# Patient Record
Sex: Female | Born: 1975 | Race: Black or African American | Hispanic: No | Marital: Single | Smoking: Never smoker
Health system: Southern US, Community
[De-identification: ages and names within clinical notes are randomized; demographics above are authoritative.]

## PROBLEM LIST (undated history)

## (undated) DIAGNOSIS — Z808 Family history of malignant neoplasm of other organs or systems: Secondary | ICD-10-CM

## (undated) DIAGNOSIS — C50919 Malignant neoplasm of unspecified site of unspecified female breast: Secondary | ICD-10-CM

## (undated) DIAGNOSIS — D649 Anemia, unspecified: Secondary | ICD-10-CM

## (undated) DIAGNOSIS — D219 Benign neoplasm of connective and other soft tissue, unspecified: Secondary | ICD-10-CM

## (undated) DIAGNOSIS — K219 Gastro-esophageal reflux disease without esophagitis: Secondary | ICD-10-CM

## (undated) DIAGNOSIS — Z1371 Encounter for nonprocreative screening for genetic disease carrier status: Secondary | ICD-10-CM

## (undated) DIAGNOSIS — R112 Nausea with vomiting, unspecified: Secondary | ICD-10-CM

## (undated) DIAGNOSIS — A64 Unspecified sexually transmitted disease: Secondary | ICD-10-CM

## (undated) DIAGNOSIS — Z800 Family history of malignant neoplasm of digestive organs: Secondary | ICD-10-CM

## (undated) DIAGNOSIS — Z923 Personal history of irradiation: Secondary | ICD-10-CM

## (undated) DIAGNOSIS — Z87442 Personal history of urinary calculi: Secondary | ICD-10-CM

## (undated) DIAGNOSIS — L659 Nonscarring hair loss, unspecified: Secondary | ICD-10-CM

## (undated) DIAGNOSIS — Z9889 Other specified postprocedural states: Secondary | ICD-10-CM

## (undated) HISTORY — DX: Family history of malignant neoplasm of digestive organs: Z80.0

## (undated) HISTORY — DX: Unspecified sexually transmitted disease: A64

## (undated) HISTORY — DX: Nonscarring hair loss, unspecified: L65.9

## (undated) HISTORY — PX: BREAST LUMPECTOMY: SHX2

## (undated) HISTORY — PX: DILATION AND CURETTAGE OF UTERUS: SHX78

## (undated) HISTORY — DX: Family history of malignant neoplasm of other organs or systems: Z80.8

---

## 2002-03-24 ENCOUNTER — Other Ambulatory Visit: Admission: RE | Admit: 2002-03-24 | Discharge: 2002-03-24 | Payer: Self-pay | Admitting: Gynecology

## 2003-06-02 ENCOUNTER — Other Ambulatory Visit: Admission: RE | Admit: 2003-06-02 | Discharge: 2003-06-02 | Payer: Self-pay | Admitting: Gynecology

## 2003-09-20 ENCOUNTER — Emergency Department (HOSPITAL_COMMUNITY): Admission: EM | Admit: 2003-09-20 | Discharge: 2003-09-20 | Payer: Self-pay | Admitting: Emergency Medicine

## 2003-09-22 ENCOUNTER — Inpatient Hospital Stay (HOSPITAL_COMMUNITY): Admission: AD | Admit: 2003-09-22 | Discharge: 2003-09-22 | Payer: Self-pay | Admitting: Gynecology

## 2003-09-25 ENCOUNTER — Inpatient Hospital Stay (HOSPITAL_COMMUNITY): Admission: AD | Admit: 2003-09-25 | Discharge: 2003-09-25 | Payer: Self-pay | Admitting: Gynecology

## 2003-09-28 ENCOUNTER — Inpatient Hospital Stay (HOSPITAL_COMMUNITY): Admission: AD | Admit: 2003-09-28 | Discharge: 2003-09-28 | Payer: Self-pay | Admitting: Gynecology

## 2004-06-16 ENCOUNTER — Other Ambulatory Visit: Admission: RE | Admit: 2004-06-16 | Discharge: 2004-06-16 | Payer: Self-pay | Admitting: Gynecology

## 2005-02-28 ENCOUNTER — Encounter: Admission: RE | Admit: 2005-02-28 | Discharge: 2005-02-28 | Payer: Self-pay | Admitting: Gynecology

## 2005-08-02 ENCOUNTER — Other Ambulatory Visit: Admission: RE | Admit: 2005-08-02 | Discharge: 2005-08-02 | Payer: Self-pay | Admitting: Gynecology

## 2007-01-27 ENCOUNTER — Other Ambulatory Visit: Admission: RE | Admit: 2007-01-27 | Discharge: 2007-01-27 | Payer: Self-pay | Admitting: Gynecology

## 2008-05-05 ENCOUNTER — Other Ambulatory Visit: Admission: RE | Admit: 2008-05-05 | Discharge: 2008-05-05 | Payer: Self-pay | Admitting: Gynecology

## 2008-11-23 ENCOUNTER — Ambulatory Visit: Payer: Self-pay | Admitting: Gynecology

## 2009-06-07 ENCOUNTER — Ambulatory Visit: Payer: Self-pay | Admitting: Gynecology

## 2009-07-06 ENCOUNTER — Ambulatory Visit: Payer: Self-pay | Admitting: Gynecology

## 2009-07-06 ENCOUNTER — Encounter: Payer: Self-pay | Admitting: Gynecology

## 2009-07-06 ENCOUNTER — Other Ambulatory Visit: Admission: RE | Admit: 2009-07-06 | Discharge: 2009-07-06 | Payer: Self-pay | Admitting: Gynecology

## 2009-08-29 ENCOUNTER — Ambulatory Visit: Payer: Self-pay | Admitting: Gynecology

## 2010-02-14 ENCOUNTER — Ambulatory Visit: Payer: Self-pay | Admitting: Gynecology

## 2010-06-20 ENCOUNTER — Ambulatory Visit: Payer: Self-pay | Admitting: Gynecology

## 2011-08-29 ENCOUNTER — Encounter: Payer: Self-pay | Admitting: Gynecology

## 2011-08-31 ENCOUNTER — Ambulatory Visit (INDEPENDENT_AMBULATORY_CARE_PROVIDER_SITE_OTHER): Payer: Managed Care, Other (non HMO) | Admitting: Women's Health

## 2011-08-31 ENCOUNTER — Encounter: Payer: Self-pay | Admitting: Women's Health

## 2011-08-31 ENCOUNTER — Other Ambulatory Visit (HOSPITAL_COMMUNITY)
Admission: RE | Admit: 2011-08-31 | Discharge: 2011-08-31 | Disposition: A | Discharge status: Home / Self Care | Payer: Managed Care, Other (non HMO) | Source: Ambulatory Visit | Attending: Women's Health | Admitting: Women's Health

## 2011-08-31 VITALS — BP 130/78 | Ht 65.0 in | Wt 192.0 lb

## 2011-08-31 DIAGNOSIS — Z01419 Encounter for gynecological examination (general) (routine) without abnormal findings: Secondary | ICD-10-CM

## 2011-08-31 DIAGNOSIS — N920 Excessive and frequent menstruation with regular cycle: Secondary | ICD-10-CM

## 2011-08-31 DIAGNOSIS — R823 Hemoglobinuria: Secondary | ICD-10-CM

## 2011-08-31 DIAGNOSIS — Z113 Encounter for screening for infections with a predominantly sexual mode of transmission: Secondary | ICD-10-CM

## 2011-08-31 DIAGNOSIS — Z833 Family history of diabetes mellitus: Secondary | ICD-10-CM

## 2011-08-31 DIAGNOSIS — Z13220 Encounter for screening for lipoid disorders: Secondary | ICD-10-CM

## 2011-08-31 MED ORDER — TRANEXAMIC ACID 650 MG PO TABS: 1300.0000 mg | ORAL_TABLET | Freq: Three times a day (TID) | ORAL | Status: DC

## 2011-08-31 NOTE — Progress Notes (Signed)
Donna Matthews 09/11/1976 [id number]    History:    The patient presents for annual exam.  Works for [employer].  Past medical history, past surgical history, family history and social history were all reviewed and documented in the EPIC chart.   ROS:  A  ROS was performed and pertinent positives and negatives are included in the history.  Exam:  Filed Vitals:   08/31/11 1448  BP: 130/78    General appearance:  Normal Head/Neck:  Normal, without cervical or supraclavicular adenopathy. Thyroid:  Symmetrical, normal in size, without palpable masses or nodularity. Respiratory  Effort:  Normal  Auscultation:  Clear without wheezing or rhonchi Cardiovascular  Auscultation:  Regular rate, without rubs, murmurs or gallops  Edema/varicosities:  Not grossly evident Abdominal  Soft,nontender, without masses, guarding or rebound.  Liver/spleen:  No organomegaly noted  Hernia:  None appreciated  Skin  Inspection:  Grossly normal  Palpation:  Grossly normal Neurologic/psychiatric  Orientation:  Normal with appropriate conversation.  Mood/affect:  Normal  Genitourinary    Breasts: Examined lying and sitting.     Right: Without masses, retractions, discharge or axillary adenopathy.     Left: Without masses, retractions, discharge or axillary adenopathy.   Inguinal/mons:  Normal without inguinal adenopathy  External genitalia:  Normal  BUS/Urethra/Skene's glands:  Normal  Bladder:  Normal  Vagina:  Normal  Cervix:  Normal  Uterus:  retroverted, normal in size, shape and contour.  Midline and mobile  Adnexa/parametria:     Rt: Without masses or tenderness.   Lt: Without masses or tenderness.  Anus and perineum: Normal  Digital rectal exam: Normal sphincter tone without palpated masses or tenderness  Assessment/Plan:  35 y.o. SBF G2P0 for annual exam. Monthly 5 day  heavy cycle not sexually active for 1 year.   Normal GYN exam with menorrhagia  Plan: encouraged condoms when becomes  sexually active and return to the office for contraception.Options reviewed for menorrhagia. Will try lysteda 3 times a day for no more than 5 days with her cycle. Will try to see if this will lighten her flow. Did review slight risk for blood clots. SBEs, calcium rich diet, MVI daily, exercise encouraged. CBC, lipid profile, glucose, UA and Pap. HIV, hep B and C., RPR.(neg GC/CHl)  Long discussion on weight loss and exercise for health. Weight Watchers was reviewed.    Harrington Challenger [facility], 3:27 PM 08/31/2011

## 2011-09-01 LAB — HEPATITIS C ANTIBODY: HCV Ab: NEGATIVE

## 2011-09-01 LAB — HIV ANTIBODY (ROUTINE TESTING W REFLEX): HIV: NONREACTIVE

## 2012-02-25 ENCOUNTER — Telehealth: Payer: Self-pay | Admitting: *Deleted

## 2012-02-25 MED ORDER — FLUCONAZOLE 150 MG PO TABS: 150.0000 mg | ORAL_TABLET | Freq: Once | ORAL | Status: AC

## 2012-02-25 NOTE — Telephone Encounter (Signed)
Please call, and call in Diflucan 150 mg for patient. Office visit if no relief.

## 2012-02-25 NOTE — Telephone Encounter (Signed)
Pt informed with the below note, rx sent to pharmacy. 

## 2012-02-25 NOTE — Telephone Encounter (Signed)
Pt called c/o yeast infection x 1 day with itching only, pt is on her cycle now and can not make appointment. Pt would like rx? Pt said she will make OV once cycle off. Please advise

## 2012-02-26 ENCOUNTER — Ambulatory Visit (INDEPENDENT_AMBULATORY_CARE_PROVIDER_SITE_OTHER): Payer: Managed Care, Other (non HMO) | Admitting: Gynecology

## 2012-02-26 ENCOUNTER — Encounter: Payer: Self-pay | Admitting: Gynecology

## 2012-02-26 VITALS — BP 124/86

## 2012-02-26 DIAGNOSIS — N898 Other specified noninflammatory disorders of vagina: Secondary | ICD-10-CM

## 2012-02-26 DIAGNOSIS — M25519 Pain in unspecified shoulder: Secondary | ICD-10-CM

## 2012-02-26 DIAGNOSIS — IMO0001 Reserved for inherently not codable concepts without codable children: Secondary | ICD-10-CM

## 2012-02-26 DIAGNOSIS — Z309 Encounter for contraceptive management, unspecified: Secondary | ICD-10-CM

## 2012-02-26 DIAGNOSIS — Z113 Encounter for screening for infections with a predominantly sexual mode of transmission: Secondary | ICD-10-CM

## 2012-02-26 DIAGNOSIS — A5901 Trichomonal vulvovaginitis: Secondary | ICD-10-CM

## 2012-02-26 LAB — WET PREP FOR TRICH, YEAST, CLUE: Yeast Wet Prep HPF POC: NONE SEEN

## 2012-02-26 MED ORDER — TINIDAZOLE 500 MG PO TABS: 2.0000 g | ORAL_TABLET | Freq: Once | ORAL | Status: AC

## 2012-02-26 NOTE — Progress Notes (Signed)
The patient 36 year old gravida 2 para 0 AB 2 who presented to the office today stating that she did not been sexually active in over year and became sexually active at home February 23 and was not using any form of contraception. She stated her last menstrual period was on March 11. She presented to the office today complaining of vaginal discharge with parietal is an odor. She was also complaining today of bilateral shoulder discomfort that comes and goes. She also wanted to discuss different forms of contraception since she's now currently sexually active. She has suffered in the past from heavy periods.  Exam: Breasts were examined sitting supine position they were symmetrical in appearance no skin discoloration no nipple inversion no palpable masses or tenderness no supraclavicular or axillary lymphadenopathy. Patient had full range of motion on both arms and shoulders.  Pelvic exam: Bartholin urethra Skene glands: Within normal limits Vagina: Frothy greenish-like discharge and hyperemic vaginal mucosa Cervix: Hyperemic   Wet prep: Moderate Trichomonas, positive amine to numerous to count white blood cells too numerous to count bacteria  We also had a discussion about different forms of contraception and due to the fact that she suffers from menorrhagia for 5-7 days that a Mirena IUD would be ideal. Been now a Trichomonas has been detected she will need to be treated and return back in one month for test of cure before placing a Mirena IUD. A GC and Chlamydia culture was obtained as well resulting in time of this dictation.  Assessment/plan: Trichomoniasis will be treated with Tindamax 500 mg patient to take 4 tablets today at one time and also her partner. She is to return back in a month for followup testing. She was offered HIV, RPR, hepatitis B and C. testing she refused at the present time but had been screened for these tests in September 2012 and were negative and her partner has not  changed. As to her shoulder discomfort it is not bothering her today she is to keep an eye on it to see what triggers it and let us know if it recurs or persists. When it does she can take an over-the-counter nonsteroidal. Literature information the Mirena IUD was provided as well as a model were shown to her and the procedure that as needed to have one  placed.

## 2012-02-27 LAB — GC/CHLAMYDIA PROBE AMP, GENITAL: GC Probe Amp, Genital: NEGATIVE

## 2012-10-31 ENCOUNTER — Ambulatory Visit (INDEPENDENT_AMBULATORY_CARE_PROVIDER_SITE_OTHER): Payer: Managed Care, Other (non HMO) | Admitting: Women's Health

## 2012-10-31 ENCOUNTER — Encounter: Payer: Self-pay | Admitting: Women's Health

## 2012-10-31 VITALS — BP 128/70 | Ht 64.0 in | Wt 190.0 lb

## 2012-10-31 DIAGNOSIS — Z01419 Encounter for gynecological examination (general) (routine) without abnormal findings: Secondary | ICD-10-CM

## 2012-10-31 DIAGNOSIS — N898 Other specified noninflammatory disorders of vagina: Secondary | ICD-10-CM

## 2012-10-31 DIAGNOSIS — E079 Disorder of thyroid, unspecified: Secondary | ICD-10-CM

## 2012-10-31 DIAGNOSIS — Z113 Encounter for screening for infections with a predominantly sexual mode of transmission: Secondary | ICD-10-CM

## 2012-10-31 DIAGNOSIS — E669 Obesity, unspecified: Secondary | ICD-10-CM | POA: Insufficient documentation

## 2012-10-31 LAB — WET PREP FOR TRICH, YEAST, CLUE
Trich, Wet Prep: NONE SEEN
Yeast Wet Prep HPF POC: NONE SEEN

## 2012-10-31 NOTE — Patient Instructions (Addendum)
1500 Calorie Diabetic Diet The 1500 calorie diabetic diet limits calories to 1500 each day. Following this diet and making healthy meal choices can help improve overall health. It controls blood glucose (sugar) levels and can also help lower blood pressure and cholesterol.  SERVING SIZES Measuring foods and serving sizes helps to make sure you are getting the right amount of food. The list below tells how big or small some common serving sizes are.  1 oz.........4 stacked dice.  3 oz.........Deck of cards.  1 tsp........Tip of little finger.  1 tbs........Thumb.  2 tbs........Golf ball.   cup.......Half of a fist.  1 cup........A fist. GUIDELINES FOR CHOOSING FOODS The goal of this diet is to eat a variety of foods and limit calories to 1500 each day. This can be done by choosing foods that are low in calories and fat. The diet also suggests eating small amounts of food frequently. Doing this helps control your blood glucose levels, so they do not get too high or too low. Each meal or snack may include a protein food source to help you feel more satisfied. Try to eat about the same amount of food around the same time each day. This includes weekend days, travel days, and days off work. Space your meals about 4 to 5 hours apart, and add a snack between them, if you wish.  For example, a daily food plan could include breakfast, a morning snack, lunch, dinner, and an evening snack. Healthy meals and snacks have different types of foods, including whole grains, vegetables, fruits, lean meats, poultry, fish, and dairy products. As you plan your meals, select a variety of foods. Choose from the bread and starch, vegetable, fruit, dairy, and meat/protein groups. Examples of foods from each group are listed below, with their suggested serving sizes. Use measuring cups and spoons to become familiar with what a healthy portion looks like. Bread and Starch Each serving equals 15 grams of  carbohydrate.  1 slice bread.   bagel.   cup cold cereal (unsweetened).   cup hot cereal or mashed potatoes.  1 small potato (size of a computer mouse).   cup cooked pasta or rice.   English muffin.  1 cup broth-based soup.  3 cups of popcorn.  4 to 6 whole-wheat crackers.   cup cooked beans, peas, or corn. Vegetables Each serving equals 5 grams of carbohydrate.   cup cooked vegetables.  1 cup raw vegetables.   cup tomato or vegetable juice. Fruit Each serving equals 15 grams of carbohydrate.  1 small apple or orange.  1  cup watermelon or strawberries.   cup applesauce (no sugar added).  2 tbs raisins.   banana.   cup canned fruit, packed in water or in its own juice.   cup unsweetened fruit juice. Dairy Each serving equals 12 to 15 grams of carbohydrate.  1 cup fat-free milk.  6 oz artificially sweetened yogurt or plain yogurt.  1 cup low-fat buttermilk.  1 cup soy milk.  1 cup almond milk. Meat/Protein  1 large egg.  2 to 3 oz meat, poultry, or fish.   cup low-fat cottage cheese.  1 tbs peanut butter.  1 oz low-fat cheese.   cup tuna, packed in water.   cup tofu. Fat  1 tsp oil.  1 tsp trans-fat-free margarine.  1 tsp butter.  1 tsp mayonnaise.  2 tbs avocado.  1 tbs salad dressing.  1 tbs cream cheese.  2 tbs sour cream. SAMPLE 1500 CALORIE DIET   PLAN Breakfast   whole-wheat English muffin (1 carb serving).  1 tsp trans-fat-free margarine.  1 scrambled egg.  1 cup fat-free milk (1 carb serving).  1 small orange (1 carb serving). Lunch  Chicken wrap.  1 whole-wheat tortilla, 8-inch (1 carb servings).  2 oz chicken breast, sliced.  2 tbs low-fat salad dressing, such as [place].   cup shredded lettuce.  2 slices tomato.   cup carrot sticks.  1 small apple (1 carb serving). Afternoon Snack  3 graham cracker squares (1 carb serving).  1 tbs peanut butter. Dinner  2 oz lean  pork chop, broiled.  1 cup brown rice (3 carb servings).   cup steamed carrots.   cup green beans.  1 cup fat-free milk (1 carb serving).  1 tsp trans-fat-free margarine. Evening Snack   cup low-fat cottage cheese.  1 small peach or pear, sliced (or  cup canned in water) (1 carb serving). MEAL PLAN You can use this worksheet to help you make a daily meal plan based on the 1500 calorie diabetic diet suggestions. If you are using this plan to help you control your blood glucose, you may interchange carbohydrate containing foods (dairy, starches, and fruits). Select a variety of fresh foods of varying colors and flavors. The total amount of carbohydrate in your meals or snacks is more important than making sure you include all of the food groups every time you eat. You can choose from approximately this many of the following foods to build your day's meals:  6 Starches.  3 Vegetables.  2 Fruits.  2 Dairy.  4 to 6 oz Meat/Protein.  Up to 3 Fats. Your dietician can use this worksheet to help you decide how many servings and which types of foods are right for you. BREAKFAST Food Group and Servings / Food Choice Starch _________________________________________________________ Dairy __________________________________________________________ Fruit ___________________________________________________________ Meat/Protein____________________________________________________ Fat ____________________________________________________________ LUNCH Food Group and Servings / Food Choice  Starch _________________________________________________________ Meat/Protein ___________________________________________________ Vegetables _____________________________________________________ Fruit __________________________________________________________ Dairy __________________________________________________________ Fat ____________________________________________________________ Aura Fey Food Group and Servings / Food Choice Dairy __________________________________________________________ Starch _________________________________________________________ Meat/Protein____________________________________________________ Zada Girt ___________________________________________________________ Laural Golden Food Group and Servings / Food Choice Starch _________________________________________________________ Meat/Protein ___________________________________________________ Dairy __________________________________________________________ Vegetable ______________________________________________________ Fruit ___________________________________________________________ Fat ____________________________________________________________ Lollie Sails Food Group and Servings / Food Choice Fruit ___________________________________________________________ Meat/Protein ____________________________________________________ Dairy __________________________________________________________ Starch __________________________________________________________ DAILY TOTALS Starches _________________________ Vegetables _______________________ Fruits ____________________________ Dairy ____________________________ Meat/Protein_____________________ Fats _____________________________ Document Released: 06/18/2005 Document Revised: 02/18/2012 Document Reviewed: 10/13/2009 [employer] Patient Information 2013 [place], [place]. Fat and Cholesterol Control Diet Cholesterol levels in your body are determined significantly by your diet. Cholesterol levels may also be related to heart disease. The following material helps to explain this relationship and discusses what you can do to help keep your heart healthy. Not all cholesterol is bad. Low-density lipoprotein (LDL) cholesterol is the "bad" cholesterol. It may cause fatty deposits to build up inside your arteries. High-density lipoprotein (HDL) cholesterol is "good." It  helps to remove the "bad" LDL cholesterol from your blood. Cholesterol is a very important risk factor for heart disease. Other risk factors are high blood pressure, smoking, stress, heredity, and weight. The heart muscle gets its supply of blood through the coronary arteries. If your LDL cholesterol is high and your HDL cholesterol is low, you are at risk for having fatty deposits build up in your coronary arteries. This leaves less room through which blood can flow. Without sufficient blood and oxygen, the heart muscle cannot function properly and you may feel chest pains (angina  pectoris). When a coronary artery closes up entirely, a part of the heart muscle may die causing a heart attack (myocardial infarction). CHECKING CHOLESTEROL When your caregiver sends your blood to a lab to be examined for cholesterol, a complete lipid (fat) profile may be done. With this test, the total amount of cholesterol and levels of LDL and HDL are determined. Triglycerides are a type of fat that circulates in the blood. They can also be used to determine heart disease risk. The list below describes what the numbers should be: Test: Total Cholesterol.  Less than 200 mg/dl. Test: LDL "bad cholesterol."  Less than 100 mg/dl.  Less than 70 mg/dl if you are at very high risk of a heart attack or sudden cardiac death. Test: HDL "good cholesterol."  Greater than 50 mg/dl for women.  Greater than 40 mg/dl for men. Test: Triglycerides.  Less than 150 mg/dl. CONTROLLING CHOLESTEROL WITH DIET Although exercise and lifestyle factors are important, your diet is key. That is because certain foods are known to raise cholesterol and others to lower it. The goal is to balance foods for their effect on cholesterol and more importantly, to replace saturated and trans fat with other types of fat, such as monounsaturated fat, polyunsaturated fat, and omega-3 fatty acids. On average, a person should consume no more than 15 to 17 g  of saturated fat daily. Saturated and trans fats are considered "bad" fats, and they will raise LDL cholesterol. Saturated fats are primarily found in animal products such as meats, butter, and cream. However, that does not mean you need to give up all your favorite foods. Today, there are good tasting, low-fat, low-cholesterol substitutes for most of the things you like to eat. Choose low-fat or nonfat alternatives. Choose round or loin cuts of red meat. These types of cuts are lowest in fat and cholesterol. Chicken (without the skin), fish, veal, and ground [place] breast are great choices. Eliminate fatty meats, such as hot dogs and salami. Even shellfish have little or no saturated fat. Have a 3 oz (85 g) portion when you eat lean meat, poultry, or fish. Trans fats are also called "partially hydrogenated oils." They are oils that have been scientifically manipulated so that they are solid at room temperature resulting in a longer shelf life and improved taste and texture of foods in which they are added. Trans fats are found in stick margarine, some tub margarines, cookies, crackers, and baked goods.  When baking and cooking, oils are a great substitute for butter. The monounsaturated oils are especially beneficial since it is believed they lower LDL and raise HDL. The oils you should avoid entirely are saturated tropical oils, such as coconut and palm.  Remember to eat a lot from food groups that are naturally free of saturated and trans fat, including fish, fruit, vegetables, beans, grains (barley, rice, couscous, bulgur wheat), and pasta (without cream sauces).  IDENTIFYING FOODS THAT LOWER CHOLESTEROL  Soluble fiber may lower your cholesterol. This type of fiber is found in fruits such as apples, vegetables such as broccoli, potatoes, and carrots, legumes such as beans, peas, and lentils, and grains such as barley. Foods fortified with plant sterols (phytosterol) may also lower cholesterol. You should  eat at least 2 g per day of these foods for a cholesterol lowering effect.  Read package labels to identify low-saturated fats, trans fat free, and low-fat foods at the supermarket. Select cheeses that have only 2 to 3 g saturated fat per ounce. Use  a heart-healthy tub margarine that is free of trans fats or partially hydrogenated oil. When buying baked goods (cookies, crackers), avoid partially hydrogenated oils. Breads and muffins should be made from whole grains (whole-wheat or whole oat flour, instead of "flour" or "enriched flour"). Buy non-creamy canned soups with reduced salt and no added fats.  FOOD PREPARATION TECHNIQUES  Never deep-fry. If you must fry, either stir-fry, which uses very little fat, or use non-stick cooking sprays. When possible, broil, bake, or roast meats, and steam vegetables. Instead of putting butter or margarine on vegetables, use lemon and herbs, applesauce, and cinnamon (for squash and sweet potatoes), nonfat yogurt, salsa, and low-fat dressings for salads.  LOW-SATURATED FAT / LOW-FAT FOOD SUBSTITUTES Meats / Saturated Fat (g)  Avoid: Steak, marbled (3 oz/85 g) / 11 g  Choose: Steak, lean (3 oz/85 g) / 4 g  Avoid: Hamburger (3 oz/85 g) / 7 g  Choose: Hamburger, lean (3 oz/85 g) / 5 g  Avoid: Ham (3 oz/85 g) / 6 g  Choose: Ham, lean cut (3 oz/85 g) / 2.4 g  Avoid: Chicken, with skin, dark meat (3 oz/85 g) / 4 g  Choose: Chicken, skin removed, dark meat (3 oz/85 g) / 2 g  Avoid: Chicken, with skin, light meat (3 oz/85 g) / 2.5 g  Choose: Chicken, skin removed, light meat (3 oz/85 g) / 1 g Dairy / Saturated Fat (g)  Avoid: Whole milk (1 cup) / 5 g  Choose: Low-fat milk, 2% (1 cup) / 3 g  Choose: Low-fat milk, 1% (1 cup) / 1.5 g  Choose: Skim milk (1 cup) / 0.3 g  Avoid: Hard cheese (1 oz/28 g) / 6 g  Choose: Skim milk cheese (1 oz/28 g) / 2 to 3 g  Avoid: Cottage cheese, 4% fat (1 cup) / 6.5 g  Choose: Low-fat cottage cheese, 1% fat (1 cup) /  1.5 g  Avoid: Ice cream (1 cup) / 9 g  Choose: Sherbet (1 cup) / 2.5 g  Choose: Nonfat frozen yogurt (1 cup) / 0.3 g  Choose: Frozen fruit bar / trace  Avoid: Whipped cream (1 tbs) / 3.5 g  Choose: Nondairy whipped topping (1 tbs) / 1 g Condiments / Saturated Fat (g)  Avoid: Mayonnaise (1 tbs) / 2 g  Choose: Low-fat mayonnaise (1 tbs) / 1 g  Avoid: Butter (1 tbs) / 7 g  Choose: Extra light margarine (1 tbs) / 1 g  Avoid: Coconut oil (1 tbs) / 11.8 g  Choose: Olive oil (1 tbs) / 1.8 g  Choose: Corn oil (1 tbs) / 1.7 g  Choose: Safflower oil (1 tbs) / 1.2 g  Choose: Sunflower oil (1 tbs) / 1.4 g  Choose: Soybean oil (1 tbs) / 2.4 g  Choose: Canola oil (1 tbs) / 1 g Document Released: 11/26/2005 Document Revised: 02/18/2012 Document Reviewed: 05/17/2011 [facility] Patient Information 2013 [place], [place].

## 2012-10-31 NOTE — Progress Notes (Signed)
Donna Matthews 08-08-1976 [id number]    History:    The patient presents for annual exam.  Regular monthly 7 day cycle/not sexually active. Cycle heavy first one to 2 days. History of LGSIL in 53 with normal Paps since 89.   Past medical history, past surgical history, family history and social history were all reviewed and documented in the EPIC chart. Works at [employer]. History of an ectopic in 2004. Both mother and sister are hypertensive and type II diabetics on insulin.  Father died of heart related problem at age 38.   ROS:  A  ROS was performed and pertinent positives and negatives are included in the history.  Exam:  Filed Vitals:   10/31/12 1450  BP: 128/70    General appearance:  Normal Head/Neck:  Normal, without cervical or supraclavicular adenopathy. Thyroid:  Symmetrical, normal in size, without palpable masses or nodularity. Respiratory  Effort:  Normal  Auscultation:  Clear without wheezing or rhonchi Cardiovascular  Auscultation:  Regular rate, without rubs, murmurs or gallops  Edema/varicosities:  Not grossly evident Abdominal  Soft,nontender, without masses, guarding or rebound.  Liver/spleen:  No organomegaly noted  Hernia:  None appreciated  Skin  Inspection:  Grossly normal  Palpation:  Grossly normal Neurologic/psychiatric  Orientation:  Normal with appropriate conversation.  Mood/affect:  Normal  Genitourinary    Breasts: Examined lying and sitting.     Right: Without masses, retractions, discharge or axillary adenopathy.     Left: Without masses, retractions, discharge or axillary adenopathy.   Inguinal/mons:  Normal without inguinal adenopathy  External genitalia:  Normal  BUS/Urethra/Skene's glands:  Normal  Bladder:  Normal  Vagina:  Normal/wet prep negative  Cervix:  Normal  Uterus:   normal in size, shape and contour.  Midline and mobile  Adnexa/parametria:     Rt: Without masses or tenderness.   Lt: Without masses or tenderness.  Anus and  perineum: Normal  Digital rectal exam: Normal sphincter tone without palpated masses or tenderness  Assessment/Plan:  36 y.o. SBF G2 P0 for annual exam with increased vaginal discharge and requests STD screen.  Normal GYN exam Obesity  Plan: Reviewed wet prep negative  and exam normal. SBE's, increase exercise, decrease calories for weight loss, MVI daily encouraged. CBC, glucose, lipid panel, UA, GC/Chlamydia, HIV, hepatitis B, C., RPR. Pap normal 2012, reviewed  new screening guidelines. Contraception options reviewed will continue with condoms if become sexually active.    Harrington Challenger [facility], 4:16 PM 10/31/2012

## 2012-11-01 LAB — URINALYSIS W MICROSCOPIC + REFLEX CULTURE
Bilirubin Urine: NEGATIVE
Casts: NONE SEEN
Ketones, ur: NEGATIVE mg/dL
Specific Gravity, Urine: 1.026 (ref 1.005–1.030)
Urobilinogen, UA: 1 mg/dL (ref 0.0–1.0)
pH: 6 (ref 5.0–8.0)

## 2012-11-02 LAB — URINE CULTURE

## 2012-11-03 NOTE — Addendum Note (Signed)
Addended by: Leonard Schwartz A on: 11/03/2012 11:23 AM   Modules accepted: Orders

## 2012-11-10 ENCOUNTER — Other Ambulatory Visit: Payer: Managed Care, Other (non HMO)

## 2012-11-10 DIAGNOSIS — Z01419 Encounter for gynecological examination (general) (routine) without abnormal findings: Secondary | ICD-10-CM

## 2012-11-10 DIAGNOSIS — Z113 Encounter for screening for infections with a predominantly sexual mode of transmission: Secondary | ICD-10-CM

## 2012-11-10 DIAGNOSIS — E079 Disorder of thyroid, unspecified: Secondary | ICD-10-CM

## 2012-11-10 LAB — CBC WITH DIFFERENTIAL/PLATELET
Basophils Relative: 0 % (ref 0–1)
Eosinophils Absolute: 0.1 10*3/uL (ref 0.0–0.7)
Eosinophils Relative: 1 % (ref 0–5)
Hemoglobin: 7.2 g/dL — ABNORMAL LOW (ref 12.0–15.0)
Lymphs Abs: 2.6 10*3/uL (ref 0.7–4.0)
MCHC: 32.9 g/dL (ref 30.0–36.0)
Monocytes Absolute: 0.6 10*3/uL (ref 0.1–1.0)
Monocytes Relative: 10 % (ref 3–12)
Neutro Abs: 2.5 10*3/uL (ref 1.7–7.7)
Neutrophils Relative %: 44 % (ref 43–77)
RBC: 3.88 MIL/uL (ref 3.87–5.11)

## 2012-11-11 LAB — HEPATITIS B SURFACE ANTIGEN: Hepatitis B Surface Ag: NEGATIVE

## 2012-11-11 LAB — HEPATITIS C ANTIBODY: HCV Ab: NEGATIVE

## 2012-11-11 LAB — HIV ANTIBODY (ROUTINE TESTING W REFLEX): HIV: NONREACTIVE

## 2012-11-12 ENCOUNTER — Other Ambulatory Visit: Payer: Self-pay | Admitting: Women's Health

## 2012-11-12 DIAGNOSIS — D649 Anemia, unspecified: Secondary | ICD-10-CM

## 2013-04-01 ENCOUNTER — Ambulatory Visit (INDEPENDENT_AMBULATORY_CARE_PROVIDER_SITE_OTHER): Payer: Managed Care, Other (non HMO) | Admitting: Women's Health

## 2013-04-01 ENCOUNTER — Encounter: Payer: Self-pay | Admitting: Women's Health

## 2013-04-01 DIAGNOSIS — N920 Excessive and frequent menstruation with regular cycle: Secondary | ICD-10-CM | POA: Insufficient documentation

## 2013-04-01 DIAGNOSIS — R5381 Other malaise: Secondary | ICD-10-CM

## 2013-04-01 LAB — CBC WITH DIFFERENTIAL/PLATELET
Basophils Absolute: 0 10*3/uL (ref 0.0–0.1)
Eosinophils Relative: 1 % (ref 0–5)
HCT: 18.8 % — ABNORMAL LOW (ref 36.0–46.0)
Lymphocytes Relative: 35 % (ref 12–46)
Lymphs Abs: 2.1 10*3/uL (ref 0.7–4.0)
MCV: 53 fL — ABNORMAL LOW (ref 78.0–100.0)
Monocytes Absolute: 0.7 10*3/uL (ref 0.1–1.0)
Neutro Abs: 3.2 10*3/uL (ref 1.7–7.7)
RBC: 3.55 MIL/uL — ABNORMAL LOW (ref 3.87–5.11)
WBC: 6.1 10*3/uL (ref 4.0–10.5)

## 2013-04-01 LAB — TSH: TSH: 1.472 u[IU]/mL (ref 0.350–4.500)

## 2013-04-01 NOTE — Patient Instructions (Addendum)
Anemia, Nonspecific  Your exam and blood tests show you are anemic. This means your blood (hemoglobin) level is low. Normal hemoglobin values are 12 to 15 g/dL for females and 14 to 17 g/dL for males. Make a note of your hemoglobin level today. The hematocrit percent is also used to measure anemia. A normal hematocrit is 38% to 46% in females and 42% to 49% in males. Make a note of your hematocrit level today.  CAUSES   Anemia can be due to many different causes.   Excessive bleeding from periods (in women).   Intestinal bleeding.   Poor nutrition.   Kidney, thyroid, liver, and bone marrow diseases.  SYMPTOMS   Anemia can come on suddenly (acute). It can also come on slowly. Symptoms can include:   Minor weakness.   Dizziness.   Palpitations.   Shortness of breath.  Symptoms may be absent until half your hemoglobin is missing if it comes on slowly. Anemia due to acute blood loss from an injury or internal bleeding may require blood transfusion if the loss is severe. Hospital care is needed if you are anemic and there is significant continual blood loss.  TREATMENT    Stool tests for blood (Hemoccult) and additional lab tests are often needed. This determines the best treatment.   Further checking on your condition and your response to treatment is very important. It often takes many weeks to correct anemia.  Depending on the cause, treatment can include:   Supplements of iron.   Vitamins B12 and folic acid.   Hormone medicines.If your anemia is due to bleeding, finding the cause of the blood loss is very important. This will help avoid further problems.  SEEK IMMEDIATE MEDICAL CARE IF:    You develop fainting, extreme weakness, shortness of breath, or chest pain.   You develop heavy vaginal bleeding.   You develop bloody or black, tarry stools or vomit up blood.   You develop a high fever, rash, repeated vomiting, or dehydration.  Document Released: 01/03/2005 Document Revised: 02/18/2012 Document  Reviewed: 10/11/2009  ExitCare Patient Information 2013 [employer].

## 2013-04-01 NOTE — Progress Notes (Signed)
Patient ID: Donna Matthews, female   DOB: 1976/10/29, 38 y.o.   MRN: [id number] Presents with complaint of fatigue. Having regular monthly 6-7 day heavy cycles. Reports changing pad every hour during heavy days.Not sexually active. Desires pregnancy. History of ectopic. Denies vaginal discharge, urinary symptoms or abdominal pain. Denies dizziness. Hemoglobin/hematocrit 7.2 and 21.9 at annual exam in December 2013.  Exam: Appears well, abdomen obese, nontender.  Menorrhagia/anemia/fatigue  Plan: CBC, TSH, Motrin as needed with cycles. Continue iron supplements daily and increase iron rich foods and diet. Options for menorrhagia reviewed declines at this time. Ultrasound to assess uterus. Multivitamin or folic acid daily encouraged. Reviewed importance of increasing regular exercise, decreasing calories for weight loss for health.

## 2013-04-17 ENCOUNTER — Ambulatory Visit (INDEPENDENT_AMBULATORY_CARE_PROVIDER_SITE_OTHER): Payer: Managed Care, Other (non HMO) | Admitting: Women's Health

## 2013-04-17 ENCOUNTER — Encounter: Payer: Self-pay | Admitting: Women's Health

## 2013-04-17 ENCOUNTER — Ambulatory Visit (INDEPENDENT_AMBULATORY_CARE_PROVIDER_SITE_OTHER): Payer: Managed Care, Other (non HMO)

## 2013-04-17 DIAGNOSIS — N920 Excessive and frequent menstruation with regular cycle: Secondary | ICD-10-CM

## 2013-04-17 DIAGNOSIS — R350 Frequency of micturition: Secondary | ICD-10-CM

## 2013-04-17 LAB — URINALYSIS W MICROSCOPIC + REFLEX CULTURE
Bilirubin Urine: NEGATIVE
Hgb urine dipstick: NEGATIVE
Ketones, ur: NEGATIVE mg/dL
Nitrite: NEGATIVE
Specific Gravity, Urine: 1.02 (ref 1.005–1.030)
Urobilinogen, UA: 1 mg/dL (ref 0.0–1.0)

## 2013-04-17 NOTE — Progress Notes (Signed)
Patient ID: Donna Matthews, female   DOB: [birth year], 38 y.o.   MRN: [id number] Presents for ultrasound due to menorrhagia and cycles lasting 10 days, history of fibroids. TSH normal. CBC 5.9/18.8  -  04/01/13. Has had problems with anemia in the past but this is the lowest. Has started back on iron supplements and increasing iron rich foods. Fatigue was only symptom. Denies vaginal discharge but having some frequency.  Exam: appears well. Ultrasound: At least 4 fibroids seen. 1) 45x41 mm 2) 23x23 mm 3) 23x22 mm 4) 23x16 mm. Ovaries appear normal. Possible endometrial polyp with color flow 32x24 mm. Trace of free fluid seen in CDS.  Probable endometrial Polyp Fibroids/menorrhagia Anemia  Plan: CBC, UA- trace leukocytes. Urine culture pending. Continue iron supplements and iron rich foods. Schedule Sonohystogram after next cycle with Dr. Lily Peer.  Instructed to take motrin before. Declines Mirena IUD, birth control pills for menorrhagia, would like to conceive but is not in a relationship.

## 2013-04-18 LAB — CBC WITH DIFFERENTIAL/PLATELET
Basophils Relative: 0 % (ref 0–1)
Eosinophils Absolute: 0.1 10*3/uL (ref 0.0–0.7)
Eosinophils Relative: 2 % (ref 0–5)
HCT: 28.9 % — ABNORMAL LOW (ref 36.0–46.0)
Lymphs Abs: 2.2 10*3/uL (ref 0.7–4.0)
MCH: 20 pg — ABNORMAL LOW (ref 26.0–34.0)
MCV: 64.2 fL — ABNORMAL LOW (ref 78.0–100.0)
Monocytes Relative: 12 % (ref 3–12)
Neutrophils Relative %: 55 % (ref 43–77)
Platelets: 272 10*3/uL (ref 150–400)
WBC: 6.9 10*3/uL (ref 4.0–10.5)

## 2013-04-20 ENCOUNTER — Telehealth: Payer: Self-pay | Admitting: *Deleted

## 2013-04-20 ENCOUNTER — Other Ambulatory Visit: Payer: Self-pay | Admitting: *Deleted

## 2013-04-20 ENCOUNTER — Other Ambulatory Visit: Payer: Self-pay | Admitting: Women's Health

## 2013-04-20 DIAGNOSIS — D649 Anemia, unspecified: Secondary | ICD-10-CM

## 2013-04-20 MED ORDER — FLUCONAZOLE 150 MG PO TABS: 150.0000 mg | ORAL_TABLET | Freq: Once | ORAL | Status: DC

## 2013-04-20 NOTE — Telephone Encounter (Signed)
Pt was seen on 04/17/13 to discuss ultrasound, pt said she does'nt feel right. Vaginal itching internal and slight brownish discharge with very light odor that comes and goes. Pt asked if you want her to come in for OV?

## 2013-04-20 NOTE — Telephone Encounter (Signed)
Telephone call, states spotting today states usually spots for several days before full flow of cycle which is still. Having some vaginal itching. Scheduled for sonohysterogram with Dr. Lily Peer next week. Will describe Diflucan 1 dose.

## 2013-04-21 LAB — URINE CULTURE: Colony Count: 2000

## 2013-04-30 ENCOUNTER — Telehealth: Payer: Self-pay | Admitting: *Deleted

## 2013-04-30 NOTE — Telephone Encounter (Signed)
(  Nancy patient) Pt scheduled for [facility] on 05/01/13 with you, pt is still bleeding day 7 now. Her cycle can last up to 10 days,  She asked if you want her to reschedule? Please advise

## 2013-04-30 NOTE — Telephone Encounter (Signed)
Please let Donna Matthews know if pt response and she can call pt.

## 2013-05-01 ENCOUNTER — Ambulatory Visit (INDEPENDENT_AMBULATORY_CARE_PROVIDER_SITE_OTHER): Payer: Managed Care, Other (non HMO) | Admitting: Gynecology

## 2013-05-01 ENCOUNTER — Ambulatory Visit (INDEPENDENT_AMBULATORY_CARE_PROVIDER_SITE_OTHER): Payer: Managed Care, Other (non HMO)

## 2013-05-01 ENCOUNTER — Other Ambulatory Visit: Payer: Self-pay | Admitting: Women's Health

## 2013-05-01 ENCOUNTER — Other Ambulatory Visit: Payer: Self-pay | Admitting: Gynecology

## 2013-05-01 DIAGNOSIS — N920 Excessive and frequent menstruation with regular cycle: Secondary | ICD-10-CM

## 2013-05-01 DIAGNOSIS — D649 Anemia, unspecified: Secondary | ICD-10-CM | POA: Insufficient documentation

## 2013-05-01 DIAGNOSIS — D250 Submucous leiomyoma of uterus: Secondary | ICD-10-CM | POA: Insufficient documentation

## 2013-05-01 DIAGNOSIS — N946 Dysmenorrhea, unspecified: Secondary | ICD-10-CM | POA: Insufficient documentation

## 2013-05-01 MED ORDER — MEGESTROL ACETATE 40 MG PO TABS: 40.0000 mg | ORAL_TABLET | Freq: Two times a day (BID) | ORAL | Status: DC

## 2013-05-01 NOTE — Addendum Note (Signed)
Addended by: Ok Edwards on: 05/01/2013 09:10 AM   Modules accepted: Orders

## 2013-05-01 NOTE — Telephone Encounter (Signed)
Pt was informed to keep [facility] appointment.

## 2013-05-01 NOTE — Progress Notes (Signed)
The patient is a 37 year oldgravida 2 para 0 AB 2 (1 ectopic treated with methotrexate the other pregnancy with spontaneous miscarriage). Patient presented to the office today for a sonohysterogram as a result of her workup for her menorrhagia, dysmenorrhea and anemia. Patient was seen by our nurse practitioner Maryelizabeth Rowan who had ordered an ultrasound on May 9 in the following with the results:  Uterus measured 10.0 x 7.4 x 7.3 cm with endometrial stripe of 14.3 mm. Patient had 4 fibroids as follows: 4.5 x 4.1 cm, 2.3 x 2.3 cm, 2.3 x 2.2 cm, 2.3 x 1.6 cm. Normal ovaries. There was a questionable endometrial polyp with a measurement of 3.2 x 2.4 cm. Patient's CBC over the course of 6 months as follows:  Results for KILAH, DRAHOS (MRN [id number]) as of 05/01/2013 08:46  Ref. Range 11/10/2012 12:34 04/01/2013 15:13 04/17/2013 15:41  WBC Latest Range: 4.0-10.5 K/uL 5.7 6.1 6.9  RBC Latest Range: 3.87-5.11 MIL/uL 3.88 3.55 (L) 4.50  Hemoglobin Latest Range: 12.0-15.0 g/dL 7.2 (L) 5.9 (LL) 9.0 (L)  HCT Latest Range: 36.0-46.0 % 21.9 (L) 18.8 (L) 28.9 (L)  MCV Latest Range: 78.0-100.0 fL 56.4 (L) 53.0 (L) 64.2 (L)  MCH Latest Range: 26.0-34.0 pg 18.6 (L) 16.6 (L) 20.0 (L)  MCHC Latest Range: 30.0-36.0 g/dL 14.7 82.9 56.2  RDW Latest Range: 11.5-15.5 % 18.8 (H) 20.4 (H)   Platelets Latest Range: 150-400 K/uL 427 (H) 443 (H) 272    Patient's sonohysterogram today as follows:  the cervix was cleansed with Betadine solution. A sterile uterine catheter was inserted into the uterine cavity and normal saline was instilled. Her right fundal fibroid measured 4.5 x 4.1 cm was seen in the endometrial cavity with a second intramural posterior lower uterine segment fibroid seen also.  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 Patient was to keep her fertility options open. Patient recently started taking iron tablets twice a day and has started to  make a difference in her hemoglobin. We will call in Megace 40 mg twice a day for 10 days. We'll begin to make arrangements for her to receive Lupron 11.25 mg IM in an effort to shrink the submucous myoma and have her hemoglobin returned back to normal and then plan for resectoscopic myomectomy. She will return back to the office for a sonohysterogram 3 months after the Lupron injection. Ligature information was provided and description of future planned surgery discussed as well.

## 2013-05-14 ENCOUNTER — Telehealth: Payer: Self-pay

## 2013-05-14 NOTE — Telephone Encounter (Signed)
I contacted patient because I received a fax from [facility]. I had faxed her Lupron order to them and they have been unable to reach her and are not going to be making any more attempts and will close her order in one week.  Patient said she did not know they had tried to contact her and she did want to proceed with Lupron. I provided her the phone number and extension that are on the fax and she will call them to authorize.

## 2013-08-03 ENCOUNTER — Other Ambulatory Visit: Payer: Managed Care, Other (non HMO)

## 2013-08-03 DIAGNOSIS — D649 Anemia, unspecified: Secondary | ICD-10-CM

## 2013-08-03 LAB — CBC
HCT: 32.5 % — ABNORMAL LOW (ref 36.0–46.0)
MCH: 26.4 pg (ref 26.0–34.0)
MCV: 76.5 fL — ABNORMAL LOW (ref 78.0–100.0)
Platelets: 327 10*3/uL (ref 150–400)
RDW: 15.7 % — ABNORMAL HIGH (ref 11.5–15.5)
WBC: 5.8 10*3/uL (ref 4.0–10.5)

## 2013-09-29 ENCOUNTER — Encounter: Payer: Self-pay | Admitting: Women's Health

## 2013-09-29 ENCOUNTER — Ambulatory Visit (INDEPENDENT_AMBULATORY_CARE_PROVIDER_SITE_OTHER): Payer: Managed Care, Other (non HMO) | Admitting: Women's Health

## 2013-09-29 DIAGNOSIS — S20119S Abrasion of breast, unspecified breast, sequela: Secondary | ICD-10-CM

## 2013-09-29 DIAGNOSIS — R21 Rash and other nonspecific skin eruption: Secondary | ICD-10-CM

## 2013-09-29 NOTE — Progress Notes (Signed)
Patient ID: Donna Matthews, female   DOB: [birth year], 37 y.o.   MRN: [id number] Presents with complaint of rash under  breasts with minimal itching for past several days. Noticed slightly raised pinpoint bumps with SBE. Denies any change in soaps. New antibiotic several weeks ago but no other rashes anywhere else. Denies palpable nodules, or other changes with SBE's.  Exam: Appears well. Breast exam in sitting and lying position with no palpable nodules,  retractions, dimpling. Lower aspect at both breasts bilaterally slightly raised small patch of erythema. No visible blisters or vesicles.  Probable heat rash  Plan: Loose clothes, change after exercise, keep dry, small amount of powder. Instructed to call if no relief of symptoms. History of anemia with fibroids, continue iron supplements twice daily and will return for annual exam and recheck of CBC.

## 2014-02-05 ENCOUNTER — Encounter: Payer: Self-pay | Admitting: Women's Health

## 2014-02-05 ENCOUNTER — Ambulatory Visit (INDEPENDENT_AMBULATORY_CARE_PROVIDER_SITE_OTHER): Payer: Managed Care, Other (non HMO) | Admitting: Women's Health

## 2014-02-05 DIAGNOSIS — A599 Trichomoniasis, unspecified: Secondary | ICD-10-CM

## 2014-02-05 DIAGNOSIS — N899 Noninflammatory disorder of vagina, unspecified: Secondary | ICD-10-CM

## 2014-02-05 DIAGNOSIS — Z113 Encounter for screening for infections with a predominantly sexual mode of transmission: Secondary | ICD-10-CM

## 2014-02-05 DIAGNOSIS — N898 Other specified noninflammatory disorders of vagina: Secondary | ICD-10-CM

## 2014-02-05 LAB — WET PREP FOR TRICH, YEAST, CLUE: Yeast Wet Prep HPF POC: NONE SEEN

## 2014-02-05 MED ORDER — METRONIDAZOLE 500 MG PO TABS: ORAL_TABLET | ORAL | Status: DC

## 2014-02-05 NOTE — Progress Notes (Signed)
Patient ID: Donna Matthews, female   DOB: 1976-08-31, 38 y.o.   MRN: [id number] Presents with complaint of vaginal discharge with irritation and itching. Sexually active with a prior partner. Condoms for withdrawal. Continues to have heavy cycles, history of fibroids. Denies urinary symptoms. Negative sonohysterogram 04/2013 for menorrhagia, fibroid approximately 5 cm. Continues to take multivitamin with iron supplement daily for anemia  Exam: Appears well. External genitalia within normal limits, speculum exam copious frothy discharge noted wet prep positive for clues, Trichomonas, TNTC bacteria. Bimanual uterus bulky, no CMT or adnexal fullness or tenderness. GC/Chlamydia culture pending.  Trichomonas STD screen Fibroid uterus/menorrhagia/anemia  Plan: Flagyl 2 g by mouth 2 doses for both she and her partner. Alcohol precautions reviewed. GC/Chlamydia culture pending. Keep scheduled annual exam in 3 weeks will check an HIV, hepatitis and RPR. Declines other contraception.

## 2014-02-05 NOTE — Patient Instructions (Signed)
Trichomoniasis °Trichomoniasis is an infection, caused by the Trichomonas organism, that affects both women and men. In women, the outer female genitalia and the vagina are affected. In men, the penis is mainly affected, but the prostate and other reproductive organs can also be involved. Trichomoniasis is a sexually transmitted disease (STD) and is most often passed to another person through sexual contact. The majority of people who get trichomoniasis do so from a sexual encounter and are also at risk for other STDs. °CAUSES  °· Sexual intercourse with an infected partner. °· It can be present in swimming pools or hot tubs. °SYMPTOMS  °· Abnormal gray-green frothy vaginal discharge in women. °· Vaginal itching and irritation in women. °· Itching and irritation of the area outside the vagina in women. °· Penile discharge with or without pain in males. °· Inflammation of the urethra (urethritis), causing painful urination. °· Bleeding after sexual intercourse. °RELATED COMPLICATIONS °· Pelvic inflammatory disease. °· Infection of the uterus (endometritis). °· Infertility. °· Tubal (ectopic) pregnancy. °· It can be associated with other STDs, including gonorrhea and chlamydia, hepatitis B, and HIV. °COMPLICATIONS DURING PREGNANCY °· Early (premature) delivery. °· Premature rupture of the membranes (PROM). °· Low birth weight. °DIAGNOSIS  °· Visualization of Trichomonas under the microscope from the vagina discharge. °· Ph of the vagina greater than 4.5, tested with a test tape. °· Trich Rapid Test. °· Culture of the organism, but this is not usually needed. °· It may be found on a Pap test. °· Having a "strawberry cervix,"which means the cervix looks very red like a strawberry. °TREATMENT  °· You may be given medication to fight the infection. Inform your caregiver if you could be or are pregnant. Some medications used to treat the infection should not be taken during pregnancy. °· Over-the-counter medications or  creams to decrease itching or irritation may be recommended. °· Your sexual partner will need to be treated if infected. °HOME CARE INSTRUCTIONS  °· Take all medication prescribed by your caregiver. °· Take over-the-counter medication for itching or irritation as directed by your caregiver. °· Do not have sexual intercourse while you have the infection. °· Do not douche or wear tampons. °· Discuss your infection with your partner, as your partner may have acquired the infection from you. Or, your partner may have been the person who transmitted the infection to you. °· Have your sex partner examined and treated if necessary. °· Practice safe, informed, and protected sex. °· See your caregiver for other STD testing. °SEEK MEDICAL CARE IF:  °· You still have symptoms after you finish the medication. °· You have an oral temperature above 102° F (38.9° C). °· You develop belly (abdominal) pain. °· You have pain when you urinate. °· You have bleeding after sexual intercourse. °· You develop a rash. °· The medication makes you sick or makes you throw up (vomit). °Document Released: 05/22/2001 Document Revised: 02/18/2012 Document Reviewed: 06/17/2009 °ExitCare® Patient Information ©2014 [employer]. ° °

## 2014-02-05 NOTE — Addendum Note (Signed)
Addended by: Nelva Nay on: 02/05/2014 03:39 PM   Modules accepted: Orders

## 2014-02-06 LAB — GC/CHLAMYDIA PROBE AMP
CT Probe RNA: NEGATIVE
GC PROBE AMP APTIMA: NEGATIVE

## 2014-02-23 ENCOUNTER — Encounter: Payer: Managed Care, Other (non HMO) | Admitting: Women's Health

## 2014-03-08 ENCOUNTER — Other Ambulatory Visit: Payer: Self-pay

## 2014-03-08 DIAGNOSIS — Z1231 Encounter for screening mammogram for malignant neoplasm of breast: Secondary | ICD-10-CM

## 2014-03-18 ENCOUNTER — Ambulatory Visit: Payer: Managed Care, Other (non HMO)

## 2014-03-19 ENCOUNTER — Ambulatory Visit
Admission: RE | Admit: 2014-03-19 | Discharge: 2014-03-19 | Disposition: A | Discharge status: Home / Self Care | Payer: Managed Care, Other (non HMO) | Source: Ambulatory Visit

## 2014-03-19 DIAGNOSIS — Z1231 Encounter for screening mammogram for malignant neoplasm of breast: Secondary | ICD-10-CM

## 2014-04-15 ENCOUNTER — Encounter: Payer: Self-pay | Admitting: Women's Health

## 2014-04-15 ENCOUNTER — Ambulatory Visit (INDEPENDENT_AMBULATORY_CARE_PROVIDER_SITE_OTHER): Payer: Managed Care, Other (non HMO) | Admitting: Women's Health

## 2014-04-15 ENCOUNTER — Other Ambulatory Visit (HOSPITAL_COMMUNITY)
Admission: RE | Admit: 2014-04-15 | Discharge: 2014-04-15 | Disposition: A | Discharge status: Home / Self Care | Payer: Managed Care, Other (non HMO) | Source: Ambulatory Visit | Attending: Women's Health | Admitting: Women's Health

## 2014-04-15 VITALS — BP 116/72 | Ht 64.0 in | Wt 183.0 lb

## 2014-04-15 DIAGNOSIS — B3731 Acute candidiasis of vulva and vagina: Secondary | ICD-10-CM

## 2014-04-15 DIAGNOSIS — Z01419 Encounter for gynecological examination (general) (routine) without abnormal findings: Secondary | ICD-10-CM

## 2014-04-15 DIAGNOSIS — Z13220 Encounter for screening for lipoid disorders: Secondary | ICD-10-CM

## 2014-04-15 DIAGNOSIS — B9689 Other specified bacterial agents as the cause of diseases classified elsewhere: Secondary | ICD-10-CM

## 2014-04-15 DIAGNOSIS — Z113 Encounter for screening for infections with a predominantly sexual mode of transmission: Secondary | ICD-10-CM

## 2014-04-15 DIAGNOSIS — B373 Candidiasis of vulva and vagina: Secondary | ICD-10-CM

## 2014-04-15 DIAGNOSIS — A499 Bacterial infection, unspecified: Secondary | ICD-10-CM

## 2014-04-15 DIAGNOSIS — Z1151 Encounter for screening for human papillomavirus (HPV): Secondary | ICD-10-CM | POA: Insufficient documentation

## 2014-04-15 DIAGNOSIS — N760 Acute vaginitis: Secondary | ICD-10-CM

## 2014-04-15 LAB — WET PREP FOR TRICH, YEAST, CLUE: Trich, Wet Prep: NONE SEEN

## 2014-04-15 MED ORDER — FLUCONAZOLE 150 MG PO TABS: 150.0000 mg | ORAL_TABLET | Freq: Once | ORAL | Status: DC

## 2014-04-15 MED ORDER — METRONIDAZOLE 500 MG PO TABS: 500.0000 mg | ORAL_TABLET | Freq: Two times a day (BID) | ORAL | Status: DC

## 2014-04-15 NOTE — Addendum Note (Signed)
Addended by: Alen Blew on: 04/15/2014 04:09 PM   Modules accepted: Orders

## 2014-04-15 NOTE — Patient Instructions (Signed)

## 2014-04-15 NOTE — Progress Notes (Signed)
Donna Matthews 1976-03-12 [id number]    History:    Presents for annual exam.  Cycles monthly heavy flow using no contraception pregnancy ok.  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 Fibroids, 4.5 fundal fibroid. Normal mammograms. Would like to conceive in the future, states is not ready at this time.  Past medical history, past surgical history, family history and social history were all reviewed and documented in the EPIC chart. 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 Father died age 77 from MI.   ROS:  A  12 point ROS was performed and pertinent positives and negatives are included.  Exam:  Filed Vitals:   04/15/14 1435  BP: 116/72    General appearance:  Normal Thyroid:  Symmetrical, normal in size, without palpable masses or nodularity. Respiratory  Auscultation:  Clear without wheezing or rhonchi Cardiovascular  Auscultation:  Regular rate, without rubs, murmurs or gallops  Edema/varicosities:  Not grossly evident Abdominal  Soft,nontender, without masses, guarding or rebound.  Liver/spleen:  No organomegaly noted  Hernia:  None appreciated  Skin  Inspection:  Grossly normal   Breasts: Examined lying and sitting.     Right: Without masses, retractions, discharge or axillary adenopathy.     Left: Without masses, retractions, discharge or axillary adenopathy. Gentitourinary   Inguinal/mons:  Normal without inguinal adenopathy  External genitalia:  Normal  BUS/Urethra/Skene's glands:  Normal  Vagina:  Pink discharge, wet prep positive for amines, clues, few yeast l  Cervix:  Normal  Uterus:  1214 week size uterus, nodular,   Midline and mobile  Adnexa/parametria:     Rt: Without masses or tenderness.   Lt: Without masses or tenderness.  Anus and perineum: Normal  Digital rectal exam: Normal sphincter tone without palpated masses or tenderness  Assessment/Plan:  38 y.o. SBF G1P0 for annual exam.     Bacterial vaginosis Donna Matthews Fibroid  uterus/menorrhagia  Plan: CBC, comprehensive metabolic panel, lipid panel, UA, Pap with HR HPV typing, last Pap normal 2012,new screening guidelines reviewed.  negative GC/Chlamydia 01/2014, HIV, hep B, RPR pending,.  SBE's,  annual screening mammogram, 3D tomography reviewed and encouraged history of dense breasts. Increase regular exercise and decrease calories for weight loss, continue multivitamin and iron supplement daily. Keep menstrual calendar, if cycles last  greater than 7 days or are closer than 21 days,  instructed to call office. Flagyl 500 twice daily for 7 days, alcohol precautions reviewed, Diflucan 150 by mouth x1 dose, prescriptions given with instructions, instructed to call if discharge not resolved.    Huel Cote [facility], 3:09 PM 04/15/2014

## 2014-04-16 ENCOUNTER — Other Ambulatory Visit: Payer: Self-pay | Admitting: Women's Health

## 2014-04-16 DIAGNOSIS — D649 Anemia, unspecified: Secondary | ICD-10-CM

## 2014-04-16 LAB — HIV ANTIBODY (ROUTINE TESTING W REFLEX): HIV: NONREACTIVE

## 2014-04-16 LAB — LIPID PANEL
CHOLESTEROL: 144 mg/dL (ref 0–200)
HDL: 40 mg/dL (ref >39)
LDL Cholesterol: 90 mg/dL (ref 0–99)
Total CHOL/HDL Ratio: 3.6 Ratio
Triglycerides: 71 mg/dL (ref <150)
VLDL: 14 mg/dL (ref 0–40)

## 2014-04-16 LAB — URINALYSIS W MICROSCOPIC + REFLEX CULTURE
BACTERIA UA: NONE SEEN
BILIRUBIN URINE: NEGATIVE
Casts: NONE SEEN
Crystals: NONE SEEN
GLUCOSE, UA: NEGATIVE mg/dL
HGB URINE DIPSTICK: NEGATIVE
Ketones, ur: NEGATIVE mg/dL
Leukocytes, UA: NEGATIVE
Nitrite: NEGATIVE
Protein, ur: NEGATIVE mg/dL
Specific Gravity, Urine: 1.028 (ref 1.005–1.030)
Squamous Epithelial / LPF: NONE SEEN
Urobilinogen, UA: 0.2 mg/dL (ref 0.0–1.0)
pH: 5.5 (ref 5.0–8.0)

## 2014-04-16 LAB — HEPATITIS C ANTIBODY: HCV Ab: NEGATIVE

## 2014-04-16 LAB — COMPREHENSIVE METABOLIC PANEL
ALT: 11 U/L (ref 0–35)
AST: 11 U/L (ref 0–37)
Albumin: 4.3 g/dL (ref 3.5–5.2)
Alkaline Phosphatase: 58 U/L (ref 39–117)
BUN: 9 mg/dL (ref 6–23)
CO2: 21 meq/L (ref 19–32)
CREATININE: 0.63 mg/dL (ref 0.50–1.10)
Calcium: 8.9 mg/dL (ref 8.4–10.5)
Chloride: 105 mEq/L (ref 96–112)
GLUCOSE: 79 mg/dL (ref 70–99)
Potassium: 4.1 mEq/L (ref 3.5–5.3)
SODIUM: 136 meq/L (ref 135–145)
TOTAL PROTEIN: 7.1 g/dL (ref 6.0–8.3)
Total Bilirubin: 0.3 mg/dL (ref 0.2–1.2)

## 2014-04-16 LAB — CBC WITH DIFFERENTIAL/PLATELET
BASOS PCT: 0 % (ref 0–1)
Basophils Absolute: 0 10*3/uL (ref 0.0–0.1)
EOS ABS: 0.1 10*3/uL (ref 0.0–0.7)
EOS PCT: 2 % (ref 0–5)
HEMATOCRIT: 29.1 % — AB (ref 36.0–46.0)
Hemoglobin: 10 g/dL — ABNORMAL LOW (ref 12.0–15.0)
LYMPHS ABS: 2.6 10*3/uL (ref 0.7–4.0)
Lymphocytes Relative: 36 % (ref 12–46)
MCH: 22.7 pg — AB (ref 26.0–34.0)
MCHC: 34.4 g/dL (ref 30.0–36.0)
MCV: 66.1 fL — ABNORMAL LOW (ref 78.0–100.0)
MONO ABS: 0.9 10*3/uL (ref 0.1–1.0)
MONOS PCT: 13 % — AB (ref 3–12)
NEUTROS PCT: 49 % (ref 43–77)
Neutro Abs: 3.5 10*3/uL (ref 1.7–7.7)
Platelets: 342 10*3/uL (ref 150–400)
RBC: 4.4 MIL/uL (ref 3.87–5.11)
RDW: 18.6 % — ABNORMAL HIGH (ref 11.5–15.5)
WBC: 7.2 10*3/uL (ref 4.0–10.5)

## 2014-04-16 LAB — HEPATITIS B SURFACE ANTIGEN: HEP B S AG: NEGATIVE

## 2014-04-16 LAB — RPR

## 2014-10-11 ENCOUNTER — Encounter: Payer: Self-pay | Admitting: Women's Health

## 2014-11-10 ENCOUNTER — Ambulatory Visit (INDEPENDENT_AMBULATORY_CARE_PROVIDER_SITE_OTHER): Payer: Managed Care, Other (non HMO) | Admitting: Women's Health

## 2014-11-10 ENCOUNTER — Encounter: Payer: Self-pay | Admitting: Women's Health

## 2014-11-10 VITALS — BP 122/80 | Ht 64.0 in | Wt 176.0 lb

## 2014-11-10 DIAGNOSIS — R350 Frequency of micturition: Secondary | ICD-10-CM

## 2014-11-10 DIAGNOSIS — J069 Acute upper respiratory infection, unspecified: Secondary | ICD-10-CM

## 2014-11-10 DIAGNOSIS — D649 Anemia, unspecified: Secondary | ICD-10-CM

## 2014-11-10 DIAGNOSIS — D509 Iron deficiency anemia, unspecified: Secondary | ICD-10-CM

## 2014-11-10 LAB — URINALYSIS W MICROSCOPIC + REFLEX CULTURE
BILIRUBIN URINE: NEGATIVE
Casts: NONE SEEN
Crystals: NONE SEEN
GLUCOSE, UA: NEGATIVE mg/dL
KETONES UR: NEGATIVE mg/dL
Leukocytes, UA: NEGATIVE
Nitrite: NEGATIVE
PROTEIN: NEGATIVE mg/dL
Specific Gravity, Urine: 1.02 (ref 1.005–1.030)
UROBILINOGEN UA: 0.2 mg/dL (ref 0.0–1.0)
WBC, UA: NONE SEEN WBC/hpf (ref <3)
pH: 5.5 (ref 5.0–8.0)

## 2014-11-10 NOTE — Progress Notes (Signed)
Patient ID: Dollene Cleveland, female   DOB: 08/14/1976, 38 y.o.   MRN: [id number] Presents with several issues. Follow-up CBC history of anemia, menorrhagia -  fibroid uterus. Increased urinary frequency without pain or burning. Denies vaginal discharge. Has also had an upper respiratory infection for the past week reports symptoms slightly better. Denies fever, nasal and chest congestion positive for clear to white mucus. Able to sleep at night, occasional cough. Has been using over-the-counter Robitussin. Reports numerous coworkers similar symptoms.  Exam: Appears well. Lungs clear throughout, heart regular rate and rhythm, minimal sinus pressure. UA: Trace blood, 0-2 RBCs, no wbc's, rare bacteria.  Viral upper respiratory infection Urinary frequency  Plan: Urine culture pending. CBC, continue to increase iron rich foods and iron supplement daily. Continue Robitussin, increase rest, instructed to go home and rest, increase fluids. Instructed to call if fever, productive cough for colored mucus, or symptoms do not resolve in the next week or so.

## 2014-11-11 ENCOUNTER — Other Ambulatory Visit: Payer: Self-pay | Admitting: Women's Health

## 2014-11-11 DIAGNOSIS — D509 Iron deficiency anemia, unspecified: Secondary | ICD-10-CM

## 2014-11-11 LAB — CBC WITH DIFFERENTIAL/PLATELET
Basophils Absolute: 0 10*3/uL (ref 0.0–0.1)
Basophils Relative: 0 % (ref 0–1)
EOS PCT: 1 % (ref 0–5)
Eosinophils Absolute: 0.1 10*3/uL (ref 0.0–0.7)
HCT: 25.4 % — ABNORMAL LOW (ref 36.0–46.0)
HEMOGLOBIN: 7.9 g/dL — AB (ref 12.0–15.0)
LYMPHS ABS: 2.2 10*3/uL (ref 0.7–4.0)
LYMPHS PCT: 41 % (ref 12–46)
MCH: 20.1 pg — ABNORMAL LOW (ref 26.0–34.0)
MCHC: 31.1 g/dL (ref 30.0–36.0)
MCV: 64.5 fL — ABNORMAL LOW (ref 78.0–100.0)
MPV: 8.9 fL — AB (ref 9.4–12.4)
Monocytes Absolute: 0.6 10*3/uL (ref 0.1–1.0)
Monocytes Relative: 12 % (ref 3–12)
NEUTROS ABS: 2.5 10*3/uL (ref 1.7–7.7)
Neutrophils Relative %: 46 % (ref 43–77)
Platelets: 420 10*3/uL — ABNORMAL HIGH (ref 150–400)
RBC: 3.94 MIL/uL (ref 3.87–5.11)
RDW: 23 % — ABNORMAL HIGH (ref 11.5–15.5)
WBC: 5.4 10*3/uL (ref 4.0–10.5)

## 2014-11-12 LAB — URINE CULTURE
Colony Count: NO GROWTH
Organism ID, Bacteria: NO GROWTH

## 2015-01-18 ENCOUNTER — Encounter: Payer: Self-pay | Admitting: Women's Health

## 2015-01-18 ENCOUNTER — Ambulatory Visit (INDEPENDENT_AMBULATORY_CARE_PROVIDER_SITE_OTHER): Payer: Managed Care, Other (non HMO) | Admitting: Women's Health

## 2015-01-18 VITALS — BP 130/80 | Ht 64.0 in | Wt 181.0 lb

## 2015-01-18 DIAGNOSIS — R350 Frequency of micturition: Secondary | ICD-10-CM

## 2015-01-18 DIAGNOSIS — B373 Candidiasis of vulva and vagina: Secondary | ICD-10-CM

## 2015-01-18 DIAGNOSIS — B3731 Acute candidiasis of vulva and vagina: Secondary | ICD-10-CM

## 2015-01-18 DIAGNOSIS — N898 Other specified noninflammatory disorders of vagina: Secondary | ICD-10-CM

## 2015-01-18 LAB — WET PREP FOR TRICH, YEAST, CLUE
CLUE CELLS WET PREP: NONE SEEN
Trich, Wet Prep: NONE SEEN

## 2015-01-18 LAB — URINALYSIS W MICROSCOPIC + REFLEX CULTURE
Bilirubin Urine: NEGATIVE
Glucose, UA: NEGATIVE mg/dL
HGB URINE DIPSTICK: NEGATIVE
KETONES UR: NEGATIVE mg/dL
LEUKOCYTES UA: NEGATIVE
NITRITE: NEGATIVE
PROTEIN: NEGATIVE mg/dL
Specific Gravity, Urine: 1.02 (ref 1.005–1.030)
UROBILINOGEN UA: 0.2 mg/dL (ref 0.0–1.0)
pH: 7 (ref 5.0–8.0)

## 2015-01-18 MED ORDER — FLUCONAZOLE 150 MG PO TABS: 150.0000 mg | ORAL_TABLET | Freq: Once | ORAL | Status: DC

## 2015-01-18 NOTE — Patient Instructions (Signed)

## 2015-01-18 NOTE — Progress Notes (Signed)
Patient ID: Donna Matthews, female   DOB: [birth year], 39 y.o.   MRN: [id number] Resents with complaint of increased watery discharge, vaginal irritation with itching and increased urinary frequency without pain or burning. Denies abdominal pain, fever, or odor. Same partner. Cycles mostly monthly using no contraception, pregnancy okay. History of large fibroids.  Exam: Appears well. External genitalia erythematous, speculum exam vaginal walls erythematous, moderate amount of a curdy discharge noted wet prep positive for yeast. Bimanual uterus large fibroids 18 weeks' size. Nontender. UA: Negative  Yeast vaginitis Fibroids  Plan: Diflucan 150 by mouth today repeat in 3 days if needed. Prescription, proper use given and reviewed. Instructed to return with missed cycle, continue MVI with iron daily.

## 2015-03-09 ENCOUNTER — Encounter: Payer: Self-pay | Admitting: Women's Health

## 2015-03-09 ENCOUNTER — Ambulatory Visit (INDEPENDENT_AMBULATORY_CARE_PROVIDER_SITE_OTHER): Payer: Managed Care, Other (non HMO) | Admitting: Women's Health

## 2015-03-09 VITALS — BP 134/80 | Ht 64.0 in | Wt 183.0 lb

## 2015-03-09 DIAGNOSIS — A499 Bacterial infection, unspecified: Secondary | ICD-10-CM

## 2015-03-09 DIAGNOSIS — D509 Iron deficiency anemia, unspecified: Secondary | ICD-10-CM | POA: Diagnosis not present

## 2015-03-09 DIAGNOSIS — B9689 Other specified bacterial agents as the cause of diseases classified elsewhere: Secondary | ICD-10-CM

## 2015-03-09 DIAGNOSIS — B373 Candidiasis of vulva and vagina: Secondary | ICD-10-CM | POA: Diagnosis not present

## 2015-03-09 DIAGNOSIS — N898 Other specified noninflammatory disorders of vagina: Secondary | ICD-10-CM

## 2015-03-09 DIAGNOSIS — R350 Frequency of micturition: Secondary | ICD-10-CM | POA: Diagnosis not present

## 2015-03-09 DIAGNOSIS — N938 Other specified abnormal uterine and vaginal bleeding: Secondary | ICD-10-CM | POA: Diagnosis not present

## 2015-03-09 DIAGNOSIS — N760 Acute vaginitis: Secondary | ICD-10-CM

## 2015-03-09 DIAGNOSIS — B3731 Acute candidiasis of vulva and vagina: Secondary | ICD-10-CM

## 2015-03-09 LAB — WET PREP FOR TRICH, YEAST, CLUE: Trich, Wet Prep: NONE SEEN

## 2015-03-09 LAB — URINALYSIS W MICROSCOPIC + REFLEX CULTURE
Bilirubin Urine: NEGATIVE
Casts: NONE SEEN
Crystals: NONE SEEN
Glucose, UA: NEGATIVE mg/dL
Ketones, ur: NEGATIVE mg/dL
NITRITE: NEGATIVE
Protein, ur: NEGATIVE mg/dL
Specific Gravity, Urine: 1.02 (ref 1.005–1.030)
Urobilinogen, UA: 0.2 mg/dL (ref 0.0–1.0)
pH: 7 (ref 5.0–8.0)

## 2015-03-09 LAB — CBC WITH DIFFERENTIAL/PLATELET
Basophils Absolute: 0 10*3/uL (ref 0.0–0.1)
Basophils Relative: 0 % (ref 0–1)
EOS PCT: 2 % (ref 0–5)
Eosinophils Absolute: 0.1 10*3/uL (ref 0.0–0.7)
HCT: 31.6 % — ABNORMAL LOW (ref 36.0–46.0)
Hemoglobin: 10.7 g/dL — ABNORMAL LOW (ref 12.0–15.0)
LYMPHS ABS: 1.7 10*3/uL (ref 0.7–4.0)
Lymphocytes Relative: 32 % (ref 12–46)
MCH: 23.7 pg — AB (ref 26.0–34.0)
MCHC: 33.9 g/dL (ref 30.0–36.0)
MCV: 70.1 fL — AB (ref 78.0–100.0)
MONOS PCT: 14 % — AB (ref 3–12)
MPV: 9.7 fL (ref 8.6–12.4)
Monocytes Absolute: 0.8 10*3/uL (ref 0.1–1.0)
NEUTROS ABS: 2.8 10*3/uL (ref 1.7–7.7)
NEUTROS PCT: 52 % (ref 43–77)
Platelets: 290 10*3/uL (ref 150–400)
RBC: 4.51 MIL/uL (ref 3.87–5.11)
RDW: 21 % — ABNORMAL HIGH (ref 11.5–15.5)
WBC: 5.4 10*3/uL (ref 4.0–10.5)

## 2015-03-09 LAB — PREGNANCY, URINE: Preg Test, Ur: NEGATIVE

## 2015-03-09 MED ORDER — MEDROXYPROGESTERONE ACETATE 10 MG PO TABS: 10.0000 mg | ORAL_TABLET | Freq: Every day | ORAL | Status: DC

## 2015-03-09 MED ORDER — METRONIDAZOLE 500 MG PO TABS: 500.0000 mg | ORAL_TABLET | Freq: Two times a day (BID) | ORAL | Status: DC

## 2015-03-09 MED ORDER — FLUCONAZOLE 150 MG PO TABS: 150.0000 mg | ORAL_TABLET | Freq: Once | ORAL | Status: DC

## 2015-03-09 NOTE — Progress Notes (Signed)
Patient ID: Donna Matthews, female   DOB: 08-19-1976, 39 y.o.   MRN: [id number] Presents with complaint of vaginal discharge with bleeding more days than not entire month of March. Vaginal irritation with mild itching and odor. Urinary frequency without pain or burning at end of stream. Denies abdominal pain or fever. Same partner. Denies intercourse month of March due to bleeding most days. 2014 sonohysterogram with 4cm submucosal fibroid . Desires pregnancy.  Exam: Appears well. External genitalia within normal limits, speculum exam moderate amount of a bloody discharge, wet prep positive for yeast, amines, clues, TNTC bacteria. Bimanual uterus bulky, nontender. UPT negative  DUB Yeast vaginitis and bacteria vaginosis Fibroid uterus  Plan: Provera 10 by mouth daily for 10 days, schedule sonohysterogram with Dr. Toney Rakes after bleeding stops. Call if bleeding does not stop. Diflucan 150 by mouth 1 dose, Flagyl 500 twice a day for 7 days, alcohol precautions reviewed.

## 2015-03-10 LAB — URINE CULTURE

## 2015-03-11 ENCOUNTER — Other Ambulatory Visit: Payer: Self-pay | Admitting: Women's Health

## 2015-03-11 DIAGNOSIS — N938 Other specified abnormal uterine and vaginal bleeding: Secondary | ICD-10-CM

## 2015-03-14 ENCOUNTER — Other Ambulatory Visit: Payer: Self-pay | Admitting: Gynecology

## 2015-03-14 DIAGNOSIS — N938 Other specified abnormal uterine and vaginal bleeding: Secondary | ICD-10-CM

## 2015-03-14 DIAGNOSIS — D251 Intramural leiomyoma of uterus: Secondary | ICD-10-CM

## 2015-03-14 DIAGNOSIS — D250 Submucous leiomyoma of uterus: Secondary | ICD-10-CM

## 2015-03-17 ENCOUNTER — Encounter: Payer: Self-pay | Admitting: Gynecology

## 2015-03-17 ENCOUNTER — Ambulatory Visit (INDEPENDENT_AMBULATORY_CARE_PROVIDER_SITE_OTHER): Payer: Managed Care, Other (non HMO)

## 2015-03-17 ENCOUNTER — Other Ambulatory Visit: Payer: Self-pay | Admitting: Women's Health

## 2015-03-17 ENCOUNTER — Ambulatory Visit: Payer: Managed Care, Other (non HMO) | Admitting: Gynecology

## 2015-03-17 ENCOUNTER — Other Ambulatory Visit: Payer: Self-pay | Admitting: Gynecology

## 2015-03-17 ENCOUNTER — Other Ambulatory Visit: Payer: Managed Care, Other (non HMO)

## 2015-03-17 ENCOUNTER — Ambulatory Visit (INDEPENDENT_AMBULATORY_CARE_PROVIDER_SITE_OTHER): Payer: Managed Care, Other (non HMO) | Admitting: Gynecology

## 2015-03-17 VITALS — BP 128/80 | Ht 64.0 in | Wt 181.0 lb

## 2015-03-17 DIAGNOSIS — D250 Submucous leiomyoma of uterus: Secondary | ICD-10-CM

## 2015-03-17 DIAGNOSIS — N938 Other specified abnormal uterine and vaginal bleeding: Secondary | ICD-10-CM

## 2015-03-17 DIAGNOSIS — D251 Intramural leiomyoma of uterus: Secondary | ICD-10-CM

## 2015-03-17 DIAGNOSIS — D500 Iron deficiency anemia secondary to blood loss (chronic): Secondary | ICD-10-CM

## 2015-03-17 DIAGNOSIS — N856 Intrauterine synechiae: Secondary | ICD-10-CM | POA: Diagnosis not present

## 2015-03-17 DIAGNOSIS — N920 Excessive and frequent menstruation with regular cycle: Secondary | ICD-10-CM

## 2015-03-17 NOTE — Progress Notes (Signed)
   Patient is a 39 year old gravida 2 Ab2 (1 spontaneous AB and one ectopic pregnancy) who is here in the office today for sonohysterogram and endometrial biopsy as part of her evaluation for her dysfunctional uterine bleeding. On March 30 patient was treated for BV and yeast vaginitis. Patient with known history of fibroid uterus. Patient on using any form of contraception. Patient interested in getting pregnant in the future. Patient was counseled for sonohysterogram and endometrial biopsy.  Ultrasound demonstrated the following: Uterus measured 12.2 x 9.7 x 10.0 cm with endometrial stripe of 14.2 mm patient had 3 intramural fibroids largest measures 6.5 x 5.5 cm. There was a right corpus luteum cyst noted measuring 13 mm left ovary was normal. There was no fluid in the cul-de-sac. Afterwards cervix was cleansed with Betadine solution and a sterile catheter was introduced into the uterine cavity. The uterus sounded to 11 cm. Normal saline was instilled and a submucous fibroid measuring 5.3 x 4.7 x 4.4 cm was noted along with a uterine synechia.  Following this the cervix was cleansed with Betadine solution once again and a sterile Pipelle was introduced into the uterine cavity and a biopsy was obtained and so benefits to [facility] evaluation  Review of patient's record indicated that on 03/09/2015 she had a CBC with a hemoglobin of 10.7 for which she's currently on iron supplementation.  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 We discussed putting her on a GnRH analog (Lupron) 11.25 mg every 3 months 2. I would have the patient return back to the office in 3 months for sonohysterogram and to recheck her CBC to see of the submucous myoma has decreased in size so that we can safely retrieve it resectoscopic we. Patient will be provided with literature information on Lupron. Patient fully aware that she needs to use condoms for contraception. She's to continue her iron  supplementation daily. Literature information was provided.

## 2015-03-17 NOTE — Patient Instructions (Signed)
lupron english Leuprolide injection What is this medicine? LEUPROLIDE (loo PROE lide) is a man-made hormone. It is used to treat the symptoms of prostate cancer. This medicine may also be used to treat children with early onset of puberty. It may be used for other hormonal conditions. This medicine may be used for other purposes; ask your health care provider or pharmacist if you have questions. COMMON BRAND NAME(S): Lupron What should I tell my health care provider before I take this medicine? They need to know if you have any of these conditions: -diabetes -heart disease or previous heart attack -high blood pressure -high cholesterol -pain or difficulty passing urine -spinal cord metastasis -stroke -tobacco smoker -an unusual or allergic reaction to leuprolide, benzyl alcohol, other medicines, foods, dyes, or preservatives -pregnant or trying to get pregnant -breast-feeding How should I use this medicine? This medicine is for injection under the skin or into a muscle. You will be taught how to prepare and give this medicine. Use exactly as directed. Take your medicine at regular intervals. Do not take your medicine more often than directed. It is important that you put your used needles and syringes in a special sharps container. Do not put them in a trash can. If you do not have a sharps container, call your pharmacist or healthcare provider to get one. Talk to your pediatrician regarding the use of this medicine in children. While this medicine may be prescribed for children as young as 8 years for selected conditions, precautions do apply. Overdosage: If you think you have taken too much of this medicine contact a poison control center or emergency room at once. NOTE: This medicine is only for you. Do not share this medicine with others. What if I miss a dose? If you miss a dose, take it as soon as you can. If it is almost time for your next dose, take only that dose. Do not take  double or extra doses. What may interact with this medicine? Do not take this medicine with any of the following medications: -chasteberry This medicine may also interact with the following medications: -herbal or dietary supplements, like black cohosh or DHEA -female hormones, like estrogens or progestins and birth control pills, patches, rings, or injections -female hormones, like testosterone This list may not describe all possible interactions. Give your health care provider a list of all the medicines, herbs, non-prescription drugs, or dietary supplements you use. Also tell them if you smoke, drink alcohol, or use illegal drugs. Some items may interact with your medicine. What should I watch for while using this medicine? Visit your doctor or health care professional for regular checks on your progress. During the first week, your symptoms may get worse, but then will improve as you continue your treatment. You may get hot flashes, increased bone pain, increased difficulty passing urine, or an aggravation of nerve symptoms. Discuss these effects with your doctor or health care professional, some of them may improve with continued use of this medicine. Female patients may experience a menstrual cycle or spotting during the first 2 months of therapy with this medicine. If this continues, contact your doctor or health care professional. What side effects may I notice from receiving this medicine? Side effects that you should report to your doctor or health care professional as soon as possible: -allergic reactions like skin rash, itching or hives, swelling of the face, lips, or tongue -breathing problems -chest pain -depression or memory disorders -pain in your legs or groin -pain  at site where injected -severe headache -swelling of the feet and legs -visual changes -vomiting Side effects that usually do not require medical attention (report to your doctor or health care professional if they  continue or are bothersome): -breast swelling or tenderness -decrease in sex drive or performance -diarrhea -hot flashes -loss of appetite -muscle, joint, or bone pains -nausea -redness or irritation at site where injected -skin problems or acne This list may not describe all possible side effects. Call your doctor for medical advice about side effects. You may report side effects to FDA at 1-800-FDA-1088. Where should I keep my medicine? Keep out of the reach of children. Store below 25 degrees C (77 degrees F). Do not freeze. Protect from light. Do not use if it is not clear or if there are particles present. Throw away any unused medicine after the expiration date. NOTE: This sheet is a summary. It may not cover all possible information. If you have questions about this medicine, talk to your doctor, pharmacist, or health care provider.  2015, [employer] Standard. (2009-04-12 13:26:20) Uterine Fibroid A uterine fibroid is a growth (tumor) that occurs in your uterus. This type of tumor is not cancerous and does not spread out of the uterus. You can have one or many fibroids. Fibroids can vary in size, weight, and where they grow in the uterus. Some can become quite large. Most fibroids do not require medical treatment, but some can cause pain or heavy bleeding during and between periods. CAUSES  A fibroid is the result of a single uterine cell that keeps growing (unregulated), which is different than most cells in the human body. Most cells have a control mechanism that keeps them from reproducing without control.  SIGNS AND SYMPTOMS   Bleeding.  Pelvic pain and pressure.  Bladder problems due to the size of the fibroid.  Infertility and miscarriages depending on the size and location of the fibroid. DIAGNOSIS  Uterine fibroids are diagnosed through a physical exam. Your health care provider may feel the lumpy tumors during a pelvic exam. Ultrasonography may be done to get  information regarding size, location, and number of tumors.  TREATMENT   Your health care provider may recommend watchful waiting. This involves getting the fibroid checked by your health care provider to see if it grows or shrinks.   Hormone treatment or an intrauterine device (IUD) may be prescribed.   Surgery may be needed to remove the fibroids (myomectomy) or the uterus (hysterectomy). This depends on your situation. When fibroids interfere with fertility and a woman wants to become pregnant, a health care provider may recommend having the fibroids removed.  [place] care depends on how you were treated. In general:   Keep all follow-up appointments with your health care provider.   Only take over-the-counter or prescription medicines as directed by your health care provider. If you were prescribed a hormone treatment, take the hormone medicines exactly as directed. Do not take aspirin. It can cause bleeding.   Talk to your health care provider about taking iron pills.  If your periods are troublesome but not so heavy, lie down with your feet raised slightly above your heart. Place cold packs on your lower abdomen.   If your periods are heavy, write down the number of pads or tampons you use per month. Bring this information to your health care provider.   Include green vegetables in your diet.  SEEK IMMEDIATE MEDICAL CARE IF:  You have pelvic  pain or cramps not controlled with medicines.   You have a sudden increase in pelvic pain.   You have an increase in bleeding between and during periods.   You have excessive periods and soak tampons or pads in a half hour or less.  You feel lightheaded or have fainting episodes. Document Released: 11/23/2000 Document Revised: 09/16/2013 Document Reviewed: 06/25/2013 [facility] Patient Information 2015 [place], [place]. This information is not intended to replace advice given to you by your health care  provider. Make sure you discuss any questions you have with your health care provider.

## 2015-03-21 ENCOUNTER — Telehealth: Payer: Self-pay

## 2015-03-21 NOTE — Telephone Encounter (Signed)
I let patient know that Dr. Moshe Salisbury had asked me to check ins benefits for Lupron. I explained how we send order to [facility] and they run the benefits and call her. She will be the one to tell them okay to proceed with [facility] and shipping to [place].  She understands and I will proceed with sending order via fax this afternoon.

## 2015-04-13 ENCOUNTER — Telehealth: Payer: Self-pay

## 2015-04-13 NOTE — Telephone Encounter (Signed)
Patient called inquiring what the next step toward her receiving her Lupron injection is.  I called her back and left message that I received note from [facility] that they are waiting on her to give them permission to ship. I provided the phone number for her to call. I told her as soon as I receive the medication I will call her to arrange injection.

## 2015-04-22 ENCOUNTER — Telehealth: Payer: Self-pay

## 2015-04-22 NOTE — Telephone Encounter (Signed)
I called patient and told her that pharmacy will be calling to discuss cost of medication with her and to be expecting their call. I asked her if she had not heard from them by Weds. To let me know.  I told her if it was to expensive just to tell them she did not want them to ship it and let me know.

## 2015-04-22 NOTE — Telephone Encounter (Signed)
I called the pharmacy instead of having the patient do it.  Pharmacy confirmed that they have the Rx and is ready to send it as soon as they speak with the patient about her copymt. She said it is a rather high copymt and they will confirm with patient. If confirmed they will ship it to [place] to arrive on Weds.,

## 2015-04-22 NOTE — Telephone Encounter (Signed)
I called [employer] because I received a fax today stating that prior auth is required for patient's Lupron 11.25 and has not been received. I faxed it on 4/21 and have confirmation of receipt.  I refaxed it this morning and called to be sure they received it. After being transferred I spoke with Mateo Flow she confirmed it was actually received on 03/31/15. Evidently there is an issue with patient's ins only covering a 30day supply for her Rx's and this is for 90 days supply. She spoke with her supervisor and got override for 90 days supply and sent it on through to pharmacy.  She told me to have patient call the pharmacy today to confirm they are sending it out.

## 2015-05-10 ENCOUNTER — Telehealth: Payer: Self-pay

## 2015-05-10 NOTE — Telephone Encounter (Signed)
Consult to discuss surgical options

## 2015-05-10 NOTE — Telephone Encounter (Signed)
I spoke with patient and encouraged her to call the pharmacy and let them know she does not want them to ship the medication. She said she had seen online where they ran it through on her ins and her portion was way too much.  She had called last week and Sharrie Rothman had provided her ph# to call and cancel order. She said she just had not had time but will do that.  I advised her what Dr. Moshe Salisbury recommended below.

## 2015-05-10 NOTE — Telephone Encounter (Signed)
You had ordered Lupron from her but Holland Falling contacted her with ins benefits and her portion is way to expensive and she cannot do Lupron due to cost.  She wondered what else you might recommend?

## 2015-08-30 ENCOUNTER — Other Ambulatory Visit: Payer: Self-pay

## 2015-08-30 DIAGNOSIS — Z1231 Encounter for screening mammogram for malignant neoplasm of breast: Secondary | ICD-10-CM

## 2015-09-01 ENCOUNTER — Ambulatory Visit
Admission: RE | Admit: 2015-09-01 | Discharge: 2015-09-01 | Disposition: A | Discharge status: Home / Self Care | Payer: Managed Care, Other (non HMO) | Source: Ambulatory Visit

## 2015-09-01 DIAGNOSIS — Z1231 Encounter for screening mammogram for malignant neoplasm of breast: Secondary | ICD-10-CM

## 2015-09-28 ENCOUNTER — Ambulatory Visit (INDEPENDENT_AMBULATORY_CARE_PROVIDER_SITE_OTHER): Payer: Managed Care, Other (non HMO) | Admitting: Women's Health

## 2015-09-28 ENCOUNTER — Encounter: Payer: Self-pay | Admitting: Women's Health

## 2015-09-28 VITALS — BP 138/78 | Ht 64.0 in | Wt 184.0 lb

## 2015-09-28 DIAGNOSIS — B373 Candidiasis of vulva and vagina: Secondary | ICD-10-CM

## 2015-09-28 DIAGNOSIS — Z01419 Encounter for gynecological examination (general) (routine) without abnormal findings: Secondary | ICD-10-CM

## 2015-09-28 DIAGNOSIS — B3731 Acute candidiasis of vulva and vagina: Secondary | ICD-10-CM

## 2015-09-28 LAB — TSH: TSH: 2.025 u[IU]/mL (ref 0.350–4.500)

## 2015-09-28 MED ORDER — FLUCONAZOLE 150 MG PO TABS: 150.0000 mg | ORAL_TABLET | Freq: Once | ORAL | Status: DC

## 2015-09-28 NOTE — Patient Instructions (Signed)
Health Maintenance, Female Adopting a healthy lifestyle and getting preventive care can go a long way to promote health and wellness. Talk with your health care provider about what schedule of regular examinations is right for you. This is a good chance for you to check in with your provider about disease prevention and staying healthy. In between checkups, there are plenty of things you can do on your own. Experts have done a lot of research about which lifestyle changes and preventive measures are most likely to keep you healthy. Ask your health care provider for more information. WEIGHT AND DIET  Eat a healthy diet  Be sure to include plenty of vegetables, fruits, low-fat dairy products, and lean protein.  Do not eat a lot of foods high in solid fats, added sugars, or salt.  Get regular exercise. This is one of the most important things you can do for your health.  Most adults should exercise for at least 150 minutes each week. The exercise should increase your heart rate and make you sweat (moderate-intensity exercise).  Most adults should also do strengthening exercises at least twice a week. This is in addition to the moderate-intensity exercise.  Maintain a healthy weight  Body mass index (BMI) is a measurement that can be used to identify possible weight problems. It estimates body fat based on height and weight. Your health care provider can help determine your BMI and help you achieve or maintain a healthy weight.  For females 20 years of age and older:   A BMI below 18.5 is considered underweight.  A BMI of 18.5 to 24.9 is normal.  A BMI of 25 to 29.9 is considered overweight.  A BMI of 30 and above is considered obese.  Watch levels of cholesterol and blood lipids  You should start having your blood tested for lipids and cholesterol at 41 years of age, then have this test every 5 years.  You may need to have your cholesterol levels checked more often if:  Your lipid  or cholesterol levels are high.  You are older than 41 years of age.  You are at high risk for heart disease.  CANCER SCREENING   Lung Cancer  Lung cancer screening is recommended for adults 55-80 years old who are at high risk for lung cancer because of a history of smoking.  A yearly low-dose CT scan of the lungs is recommended for people who:  Currently smoke.  Have quit within the past 15 years.  Have at least a 30-pack-year history of smoking. A pack year is smoking an average of one pack of cigarettes a day for 1 year.  Yearly screening should continue until it has been 15 years since you quit.  Yearly screening should stop if you develop a health problem that would prevent you from having lung cancer treatment.  Breast Cancer  Practice breast self-awareness. This means understanding how your breasts normally appear and feel.  It also means doing regular breast self-exams. Let your health care provider know about any changes, no matter how small.  If you are in your 20s or 30s, you should have a clinical breast exam (CBE) by a health care provider every 1-3 years as part of a regular health exam.  If you are 40 or older, have a CBE every year. Also consider having a breast X-ray (mammogram) every year.  If you have a family history of breast cancer, talk to your health care provider about genetic screening.  If you   are at high risk for breast cancer, talk to your health care provider about having an MRI and a mammogram every year.  Breast cancer gene (BRCA) assessment is recommended for women who have family members with BRCA-related cancers. BRCA-related cancers include:  Breast.  Ovarian.  Tubal.  Peritoneal cancers.  Results of the assessment will determine the need for genetic counseling and BRCA1 and BRCA2 testing. Cervical Cancer Your health care provider may recommend that you be screened regularly for cancer of the pelvic organs (ovaries, uterus, and  vagina). This screening involves a pelvic examination, including checking for microscopic changes to the surface of your cervix (Pap test). You may be encouraged to have this screening done every 3 years, beginning at age 21.  For women ages 30-65, health care providers may recommend pelvic exams and Pap testing every 3 years, or they may recommend the Pap and pelvic exam, combined with testing for human papilloma virus (HPV), every 5 years. Some types of HPV increase your risk of cervical cancer. Testing for HPV may also be done on women of any age with unclear Pap test results.  Other health care providers may not recommend any screening for nonpregnant women who are considered low risk for pelvic cancer and who do not have symptoms. Ask your health care provider if a screening pelvic exam is right for you.  If you have had past treatment for cervical cancer or a condition that could lead to cancer, you need Pap tests and screening for cancer for at least 20 years after your treatment. If Pap tests have been discontinued, your risk factors (such as having a new sexual partner) need to be reassessed to determine if screening should resume. Some women have medical problems that increase the chance of getting cervical cancer. In these cases, your health care provider may recommend more frequent screening and Pap tests. Colorectal Cancer  This type of cancer can be detected and often prevented.  Routine colorectal cancer screening usually begins at 41 years of age and continues through 41 years of age.  Your health care provider may recommend screening at an earlier age if you have risk factors for colon cancer.  Your health care provider may also recommend using home test kits to check for hidden blood in the stool.  A small camera at the end of a tube can be used to examine your colon directly (sigmoidoscopy or colonoscopy). This is done to check for the earliest forms of colorectal  cancer.  Routine screening usually begins at age 50.  Direct examination of the colon should be repeated every 5-10 years through 41 years of age. However, you may need to be screened more often if early forms of precancerous polyps or small growths are found. Skin Cancer  Check your skin from head to toe regularly.  Tell your health care provider about any new moles or changes in moles, especially if there is a change in a mole's shape or color.  Also tell your health care provider if you have a mole that is larger than the size of a pencil eraser.  Always use sunscreen. Apply sunscreen liberally and repeatedly throughout the day.  Protect yourself by wearing long sleeves, pants, a wide-brimmed hat, and sunglasses whenever you are outside. HEART DISEASE, DIABETES, AND HIGH BLOOD PRESSURE   High blood pressure causes heart disease and increases the risk of stroke. High blood pressure is more likely to develop in:  People who have blood pressure in the high end   of the normal range (130-139/85-89 mm Hg).  People who are overweight or obese.  People who are African American.  If you are 38-23 years of age, have your blood pressure checked every 3-5 years. If you are 61 years of age or older, have your blood pressure checked every year. You should have your blood pressure measured twice--once when you are at a hospital or clinic, and once when you are not at a hospital or clinic. Record the average of the two measurements. To check your blood pressure when you are not at a hospital or clinic, you can use:  An automated blood pressure machine at a pharmacy.  A home blood pressure monitor.  If you are between 45 years and 39 years old, ask your health care provider if you should take aspirin to prevent strokes.  Have regular diabetes screenings. This involves taking a blood sample to check your fasting blood sugar level.  If you are at a normal weight and have a low risk for diabetes,  have this test once every three years after 41 years of age.  If you are overweight and have a high risk for diabetes, consider being tested at a younger age or more often. PREVENTING INFECTION  Hepatitis B  If you have a higher risk for hepatitis B, you should be screened for this virus. You are considered at high risk for hepatitis B if:  You were born in a country where hepatitis B is common. Ask your health care provider which countries are considered high risk.  Your parents were born in a high-risk country, and you have not been immunized against hepatitis B (hepatitis B vaccine).  You have HIV or AIDS.  You use needles to inject street drugs.  You live with someone who has hepatitis B.  You have had sex with someone who has hepatitis B.  You get hemodialysis treatment.  You take certain medicines for conditions, including cancer, organ transplantation, and autoimmune conditions. Hepatitis C  Blood testing is recommended for:  Everyone born from [birth year] through 1967.  Anyone with known risk factors for hepatitis C. Sexually transmitted infections (STIs)  You should be screened for sexually transmitted infections (STIs) including gonorrhea and chlamydia if:  You are sexually active and are younger than 41 years of age.  You are older than 41 years of age and your health care provider tells you that you are at risk for this type of infection.  Your sexual activity has changed since you were last screened and you are at an increased risk for chlamydia or gonorrhea. Ask your health care provider if you are at risk.  If you do not have HIV, but are at risk, it may be recommended that you take a prescription medicine daily to prevent HIV infection. This is called pre-exposure prophylaxis (PrEP). You are considered at risk if:  You are sexually active and do not regularly use condoms or know the HIV status of your partner(s).  You take drugs by injection.  You are sexually  active with a partner who has HIV. Talk with your health care provider about whether you are at high risk of being infected with HIV. If you choose to begin PrEP, you should first be tested for HIV. You should then be tested every 3 months for as long as you are taking PrEP.  PREGNANCY   If you are premenopausal and you may become pregnant, ask your health care provider about preconception counseling.  If you may  become pregnant, take 400 to 800 micrograms (mcg) of folic acid every day.  If you want to prevent pregnancy, talk to your health care provider about birth control (contraception). OSTEOPOROSIS AND MENOPAUSE   Osteoporosis is a disease in which the bones lose minerals and strength with aging. This can result in serious bone fractures. Your risk for osteoporosis can be identified using a bone density scan.  If you are 73 years of age or older, or if you are at risk for osteoporosis and fractures, ask your health care provider if you should be screened.  Ask your health care provider whether you should take a calcium or vitamin D supplement to lower your risk for osteoporosis.  Menopause may have certain physical symptoms and risks.  Hormone replacement therapy may reduce some of these symptoms and risks. Talk to your health care provider about whether hormone replacement therapy is right for you.  HOME CARE INSTRUCTIONS   Schedule regular health, dental, and eye exams.  Stay current with your immunizations.   Do not use any tobacco products including cigarettes, chewing tobacco, or electronic cigarettes.  If you are pregnant, do not drink alcohol.  If you are breastfeeding, limit how much and how often you drink alcohol.  Limit alcohol intake to no more than 1 drink per day for nonpregnant women. One drink equals 12 ounces of beer, 5 ounces of wine, or 1 ounces of hard liquor.  Do not use street drugs.  Do not share needles.  Ask your health care provider for help if  you need support or information about quitting drugs.  Tell your health care provider if you often feel depressed.  Tell your health care provider if you have ever been abused or do not feel safe at home.   This information is not intended to replace advice given to you by your health care provider. Make sure you discuss any questions you have with your health care provider.   Document Released: 06/11/2011 Document Revised: 12/17/2014 Document Reviewed: 10/28/2013 Elsevier Interactive Patient Education 2016 Elsevier Inc[employer] Myomectomy Myomectomy is surgery to remove a noncancerous tumor (myoma) from the uterus. Myomas are tumors made up of fibrous tissue. They are often called fibroid tumors. Fibroid tumors can range from the size of a pea to the size of a grapefruit. In a myomectomy, the fibroid tumor is removed without removing the uterus. Because these tumors are rarely cancerous, this surgery is usually done only if the tumor is growing or causing symptoms such as pain, pressure, bleeding, or pain with intercourse. LET [facility] CARE PROVIDER KNOW ABOUT:  Any allergies you have.  All medicines you are taking, including vitamins, herbs, eye drops, creams, and over-the-counter medicines.  Previous problems you or members of your family have had with the use of anesthetics.  Any blood disorders you have.  Previous surgeries you have had.  Medical conditions you have. RISKS AND COMPLICATIONS  Generally, this is a safe procedure. However, as with any procedure, complications can occur. Possible complications include:  Excessive bleeding.  Infection.  Injury to nearby organs.  Blood clots in the legs, chest, or brain.  Scar tissue on other organs and in the pelvis. This may require another surgery to remove the scar tissue. BEFORE THE PROCEDURE  Ask your health care provider about changing or stopping your regular medicines. Avoid taking aspirin or blood thinners as directed by  your health care provider.  Do not  eat or drink anything after midnight on the night  before surgery.  If you smoke, do not  smoke for 2 weeks before the surgery.  Do not  drink alcohol the day before the surgery.  Arrange for someone to drive you home after the procedure or after your hospital stay. Also arrange for someone to help you with activities during your recovery. PROCEDURE You will be given medicine to make you sleep through the procedure (general anesthetic). Any of the following methods may be used to perform a myomectomy:  Small monitors will be put on your body. They are used to check your heart, blood pressure, and oxygen level.  An IV access tube will be put into one of your veins. Medicine will be able to flow directly into your body through this IV tube.  You might be given a medicine to help you relax (sedative).  You will be given a medicine to make you sleep (general anesthetic). A breathing tube will be placed into your lungs during the procedure.  A thin, flexible tube (catheter) will be inserted into your bladder to collect urine.  Any of the following methods may be used to perform a myomectomy:  Hysteroscopic myomectomy--This method may be used when the fibroid tumor is inside the cavity of the uterus. A long, thin tube that is like a telescope (hysteroscope) is inserted inside the uterus. A saline solution is put into your uterus. This expands the uterus and allows the surgeon to see the fibroids. Tools are passed through the hysteroscope to remove the fibroid tumor in pieces.  Laparoscopic myomectomy--A few small cuts (incisions) are made in the lower abdomen. A thin, lighted tube with a tiny camera on the end (laparoscope) is inserted through one of the incisions. This gives the surgeon a good view of the area. The fibroid tumor is removed through the other incisions. The incisions are then closed with stitches (sutures) or staples.  Abdominal  myomectomy--This method is used when the fibroid tumor cannot be removed with a hysteroscope or laparoscope. The surgery is performed through a larger surgical incision in the abdomen. The fibroid tumor is removed through this incision. The incision is closed with sutures or staples. AFTER THE PROCEDURE  If you had a laparoscopic or hysteroscopic myomectomy, you may be able to go home the same day, or you may need to stay in the hospital overnight.  If you had an abdominal myomectomy, you may need to stay in the hospital for a few days.  Your IV access tube and catheter will be removed in 1-2 days.  You may be given medicine for pain or to help you sleep.  You may be given an antibiotic medicine, if needed.   This information is not intended to replace advice given to you by your health care provider. Make sure you discuss any questions you have with your health care provider.   Document Released: 09/23/2007 Document Revised: 09/16/2013 Document Reviewed: 07/08/2013 Elsevier Interactive Patient Education [employer].

## 2015-09-28 NOTE — Progress Notes (Signed)
Donna Matthews [birth year] [id number]    History:    Presents for annual exam.  Monthly 7 day heavy cycles with dysmenorrhea using no contraception pregnancy okay. History of fibroids, fundal 4.5 cm fibroid. History of an SAB and ectopic. 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 Normal mammogram history. Same partner greater than 14 years negative STD screen.  Past medical history, past surgical history, family history and social history were all reviewed and documented in the EPIC chart. Works for an [employer]. Mother, sister diabetes and hypertension both on insulin. Father died at age 65 from  MI.  ROS:  A ROS was performed and pertinent positives and negatives are included.  Exam:  Filed Vitals:   09/28/15 1204  BP: 138/78    General appearance:  Normal Thyroid:  Symmetrical, normal in size, without palpable masses or nodularity. Respiratory  Auscultation:  Clear without wheezing or rhonchi Cardiovascular  Auscultation:  Regular rate, without rubs, murmurs or gallops  Edema/varicosities:  Not grossly evident Abdominal  Soft,nontender, without masses, guarding or rebound.  Liver/spleen:  No organomegaly noted  Hernia:  None appreciated  Skin  Inspection:  Grossly normal   Breasts: Examined lying and sitting.     Right: Without masses, retractions, discharge or axillary adenopathy.     Left: Without masses, retractions, discharge or axillary adenopathy. Gentitourinary   Inguinal/mons:  Normal without inguinal adenopathy  External genitalia:  Normal  BUS/Urethra/Skene's glands:  Normal  Vagina:  Normal  Cervix:  Normal  Uterus:  Enlarged, 12 week fibroid.  Midline and mobile  Adnexa/parametria:     Rt: Without masses or tenderness.   Lt: Without masses or tenderness.  Anus and perineum: Normal  Digital rectal exam: Normal sphincter tone without palpated masses or tenderness  Assessment/Plan:  39 y.o. SBF G2 P0  for annual exam.    Monthly 7 day  cycle/menorrhagia/dysmenorrhea Fibroid uterus Desiring conception 61 LGSIL with normal Paps after  Plan: Options of myomectomy reviewed, will think about and schedule consult with Dr. Toney Rakes if would like to proceed. SBE's, continue annual screening 3-D mammogram history of dense breasts. Encouraged increased exercise and decrease calories for weight loss. CBC, hemoglobin A1c, TSH, UA, Pap normal with negative HR HPV 2015, new screening guidelines reviewed. Diflucan 150 by mouth 1 dose, states has occasional vaginal itching.    Huel Cote WHNP, 1:24 PM 09/28/2015

## 2015-09-29 LAB — URINALYSIS W MICROSCOPIC + REFLEX CULTURE
Bilirubin Urine: NEGATIVE
CASTS: NONE SEEN [LPF]
CRYSTALS: NONE SEEN [HPF]
Glucose, UA: NEGATIVE
Ketones, ur: NEGATIVE
Leukocytes, UA: NEGATIVE
Nitrite: NEGATIVE
SPECIFIC GRAVITY, URINE: 1.015 (ref 1.001–1.035)
YEAST: NONE SEEN [HPF]
pH: 7.5 (ref 5.0–8.0)

## 2015-09-29 LAB — CBC WITH DIFFERENTIAL/PLATELET
BASOS ABS: 0 10*3/uL (ref 0.0–0.1)
BASOS PCT: 0 % (ref 0–1)
Eosinophils Absolute: 0.1 10*3/uL (ref 0.0–0.7)
Eosinophils Relative: 1 % (ref 0–5)
HEMATOCRIT: 27.7 % — AB (ref 36.0–46.0)
Hemoglobin: 8.8 g/dL — ABNORMAL LOW (ref 12.0–15.0)
LYMPHS PCT: 32 % (ref 12–46)
Lymphs Abs: 1.9 10*3/uL (ref 0.7–4.0)
MCH: 21.6 pg — ABNORMAL LOW (ref 26.0–34.0)
MCHC: 31.8 g/dL (ref 30.0–36.0)
MCV: 67.9 fL — ABNORMAL LOW (ref 78.0–100.0)
MONO ABS: 0.8 10*3/uL (ref 0.1–1.0)
MPV: 9.2 fL (ref 8.6–12.4)
Monocytes Relative: 13 % — ABNORMAL HIGH (ref 3–12)
NEUTROS ABS: 3.2 10*3/uL (ref 1.7–7.7)
Neutrophils Relative %: 54 % (ref 43–77)
Platelets: 402 10*3/uL — ABNORMAL HIGH (ref 150–400)
RBC: 4.08 MIL/uL (ref 3.87–5.11)
RDW: 23.5 % — ABNORMAL HIGH (ref 11.5–15.5)
WBC: 6 10*3/uL (ref 4.0–10.5)

## 2015-09-29 LAB — HEMOGLOBIN A1C
HEMOGLOBIN A1C: 4.9 % (ref <5.7)
MEAN PLASMA GLUCOSE: 94 mg/dL (ref <117)

## 2015-09-30 LAB — URINE CULTURE

## 2015-10-03 ENCOUNTER — Other Ambulatory Visit: Payer: Self-pay | Admitting: Women's Health

## 2015-10-03 DIAGNOSIS — D649 Anemia, unspecified: Secondary | ICD-10-CM

## 2015-10-04 ENCOUNTER — Other Ambulatory Visit: Payer: Self-pay | Admitting: Women's Health

## 2015-10-04 ENCOUNTER — Telehealth: Payer: Self-pay

## 2015-10-04 DIAGNOSIS — R8271 Bacteriuria: Secondary | ICD-10-CM

## 2015-10-04 MED ORDER — TERCONAZOLE 0.4 % VA CREA: 1.0000 | TOPICAL_CREAM | Freq: Every day | VAGINAL | Status: DC

## 2015-10-04 NOTE — Telephone Encounter (Signed)
-----   Message from Huel Cote, NP sent at 10/04/2015  7:22 AM EDT ----- Please call and review urine culture is positive, if no UTI symptoms recheck clean-catch UA if symptoms Septra twice a day for 3 days.

## 2015-10-04 NOTE — Telephone Encounter (Signed)
Patient advised. Rx sent. 

## 2015-10-04 NOTE — Telephone Encounter (Signed)
Patient said no UTI sx. She will return to recheck cc u/a.  She did say she is still feeling the way she was at office visit and the Diflucan did not help. No itching, burning, discharge or odor.  She said you checked her and told her all okay but "something just isn't right".  She said toward the end of conversation that she think she feels a little irritated inside.  She asked about getting another Rx.

## 2015-10-04 NOTE — Telephone Encounter (Signed)
Terazol 7 one applicator at bedtime 7, office visit if no relief.

## 2015-11-25 ENCOUNTER — Other Ambulatory Visit: Payer: Managed Care, Other (non HMO)

## 2015-11-25 DIAGNOSIS — D649 Anemia, unspecified: Secondary | ICD-10-CM

## 2015-11-25 DIAGNOSIS — R8271 Bacteriuria: Secondary | ICD-10-CM

## 2015-11-26 LAB — URINALYSIS W MICROSCOPIC + REFLEX CULTURE
Bacteria, UA: NONE SEEN [HPF]
Bilirubin Urine: NEGATIVE
CASTS: NONE SEEN [LPF]
CRYSTALS: NONE SEEN [HPF]
Glucose, UA: NEGATIVE
Hgb urine dipstick: NEGATIVE
KETONES UR: NEGATIVE
Leukocytes, UA: NEGATIVE
Nitrite: NEGATIVE
PH: 7 (ref 5.0–8.0)
Protein, ur: NEGATIVE
RBC / HPF: NONE SEEN RBC/HPF (ref <=2)
SPECIFIC GRAVITY, URINE: 1.013 (ref 1.001–1.035)
Squamous Epithelial / LPF: NONE SEEN [HPF] (ref <=5)
WBC, UA: NONE SEEN WBC/HPF (ref <=5)
YEAST: NONE SEEN [HPF]

## 2015-11-26 LAB — CBC WITH DIFFERENTIAL/PLATELET
BASOS PCT: 0 % (ref 0–1)
Basophils Absolute: 0 10*3/uL (ref 0.0–0.1)
EOS ABS: 0.1 10*3/uL (ref 0.0–0.7)
EOS PCT: 1 % (ref 0–5)
HCT: 25.1 % — ABNORMAL LOW (ref 36.0–46.0)
Hemoglobin: 7.9 g/dL — ABNORMAL LOW (ref 12.0–15.0)
LYMPHS ABS: 2.1 10*3/uL (ref 0.7–4.0)
Lymphocytes Relative: 27 % (ref 12–46)
MCH: 20.1 pg — AB (ref 26.0–34.0)
MCHC: 31.5 g/dL (ref 30.0–36.0)
MCV: 63.7 fL — AB (ref 78.0–100.0)
MONOS PCT: 11 % (ref 3–12)
MPV: 9.7 fL (ref 8.6–12.4)
Monocytes Absolute: 0.9 10*3/uL (ref 0.1–1.0)
Neutro Abs: 4.8 10*3/uL (ref 1.7–7.7)
Neutrophils Relative %: 61 % (ref 43–77)
PLATELETS: 432 10*3/uL — AB (ref 150–400)
RBC: 3.94 MIL/uL (ref 3.87–5.11)
RDW: 18.9 % — AB (ref 11.5–15.5)
WBC: 7.9 10*3/uL (ref 4.0–10.5)

## 2015-12-11 HISTORY — PX: BREAST LUMPECTOMY: SHX2

## 2016-02-10 ENCOUNTER — Ambulatory Visit: Payer: Managed Care, Other (non HMO) | Admitting: Women's Health

## 2016-02-10 ENCOUNTER — Encounter: Payer: Self-pay | Admitting: Women's Health

## 2016-02-10 ENCOUNTER — Ambulatory Visit (INDEPENDENT_AMBULATORY_CARE_PROVIDER_SITE_OTHER): Payer: Managed Care, Other (non HMO) | Admitting: Women's Health

## 2016-02-10 VITALS — BP 132/88

## 2016-02-10 DIAGNOSIS — L298 Other pruritus: Secondary | ICD-10-CM

## 2016-02-10 DIAGNOSIS — B373 Candidiasis of vulva and vagina: Secondary | ICD-10-CM

## 2016-02-10 DIAGNOSIS — D509 Iron deficiency anemia, unspecified: Secondary | ICD-10-CM | POA: Diagnosis not present

## 2016-02-10 DIAGNOSIS — N898 Other specified noninflammatory disorders of vagina: Secondary | ICD-10-CM

## 2016-02-10 DIAGNOSIS — B3731 Acute candidiasis of vulva and vagina: Secondary | ICD-10-CM

## 2016-02-10 LAB — CBC WITH DIFFERENTIAL/PLATELET
BASOS ABS: 0 10*3/uL (ref 0.0–0.1)
Basophils Relative: 0 % (ref 0–1)
Eosinophils Absolute: 0.1 10*3/uL (ref 0.0–0.7)
Eosinophils Relative: 1 % (ref 0–5)
HEMATOCRIT: 25.6 % — AB (ref 36.0–46.0)
HEMOGLOBIN: 8.3 g/dL — AB (ref 12.0–15.0)
LYMPHS ABS: 2 10*3/uL (ref 0.7–4.0)
LYMPHS PCT: 32 % (ref 12–46)
MCH: 21.4 pg — ABNORMAL LOW (ref 26.0–34.0)
MCHC: 32.4 g/dL (ref 30.0–36.0)
MCV: 66.1 fL — AB (ref 78.0–100.0)
MPV: 9.2 fL (ref 8.6–12.4)
Monocytes Absolute: 0.8 10*3/uL (ref 0.1–1.0)
Monocytes Relative: 13 % — ABNORMAL HIGH (ref 3–12)
NEUTROS ABS: 3.3 10*3/uL (ref 1.7–7.7)
Neutrophils Relative %: 54 % (ref 43–77)
Platelets: 376 10*3/uL (ref 150–400)
RBC: 3.87 MIL/uL (ref 3.87–5.11)
RDW: 20.3 % — ABNORMAL HIGH (ref 11.5–15.5)
WBC: 6.2 10*3/uL (ref 4.0–10.5)

## 2016-02-10 LAB — WET PREP FOR TRICH, YEAST, CLUE
Clue Cells Wet Prep HPF POC: NONE SEEN
Trich, Wet Prep: NONE SEEN
YEAST WET PREP: NONE SEEN

## 2016-02-10 MED ORDER — FLUCONAZOLE 150 MG PO TABS: 150.0000 mg | ORAL_TABLET | Freq: Once | ORAL | Status: DC

## 2016-02-10 NOTE — Progress Notes (Signed)
Patient ID: Donna Matthews, female   DOB: Aug 27, 1976, 40 y.o.   MRN: [id number] Presents with complaint of questionable vaginal infection, mild itching. Monthly 6-7 day cycle desiring conception. Menorrhagia/fibroids. Hemoglobin and annual exam 7.9/25.1. Been taking iron supplements daily.  Exam: Appears well. External genitalia within normal limits, no erythema, speculum exam scant white discharge without odor or erythema, wet prep negative. Bimanual uterus enlarged 16 week size, nontender no adnexal fullness or tenderness.  Mild vaginal itching Fibroid uterus/Menorrhagia Anemia  Plan: Repeat CBC. Continue iron supplements and increase iron rich foods in diet. Reviewed normality of exam wet prep, refill of Diflucan 150 if itching would persist. Schedule appointment with Dr. Toney Rakes to discuss possible myomectomy.

## 2016-02-10 NOTE — Patient Instructions (Signed)
Anemia, Nonspecific Anemia is a condition in which the concentration of red blood cells or hemoglobin in the blood is below normal. Hemoglobin is a substance in red blood cells that carries oxygen to the tissues of the body. Anemia results in not enough oxygen reaching these tissues.  CAUSES  Common causes of anemia include:   Excessive bleeding. Bleeding may be internal or external. This includes excessive bleeding from periods (in women) or from the intestine.   Poor nutrition.   Chronic kidney, thyroid, and liver disease.  Bone marrow disorders that decrease red blood cell production.  Cancer and treatments for cancer.  HIV, AIDS, and their treatments.  Spleen problems that increase red blood cell destruction.  Blood disorders.  Excess destruction of red blood cells due to infection, medicines, and autoimmune disorders. SIGNS AND SYMPTOMS   Minor weakness.   Dizziness.   Headache.  Palpitations.   Shortness of breath, especially with exercise.   Paleness.  Cold sensitivity.  Indigestion.  Nausea.  Difficulty sleeping.  Difficulty concentrating. Symptoms may occur suddenly or they may develop slowly.  DIAGNOSIS  Additional blood tests are often needed. These help your health care provider determine the best treatment. Your health care provider will check your stool for blood and look for other causes of blood loss.  TREATMENT  Treatment varies depending on the cause of the anemia. Treatment can include:   Supplements of iron, vitamin B12, or folic acid.   Hormone medicines.   A blood transfusion. This may be needed if blood loss is severe.   Hospitalization. This may be needed if there is significant continual blood loss.   Dietary changes.  Spleen removal. HOME CARE INSTRUCTIONS Keep all follow-up appointments. It often takes many weeks to correct anemia, and having your health care provider check on your condition and your response to  treatment is very important. SEEK IMMEDIATE MEDICAL CARE IF:   You develop extreme weakness, shortness of breath, or chest pain.   You become dizzy or have trouble concentrating.  You develop heavy vaginal bleeding.   You develop a rash.   You have bloody or black, tarry stools.   You faint.   You vomit up blood.   You vomit repeatedly.   You have abdominal pain.  You have a fever or persistent symptoms for more than 2-3 days.   You have a fever and your symptoms suddenly get worse.   You are dehydrated.  MAKE SURE YOU:  Understand these instructions.  Will watch your condition.  Will get help right away if you are not doing well or get worse.   This information is not intended to replace advice given to you by your health care provider. Make sure you discuss any questions you have with your health care provider.   Document Released: 01/03/2005 Document Revised: 07/29/2013 Document Reviewed: 05/22/2013 Elsevier Interactive Patient Education 2016 Elsevier Inc.  

## 2016-08-16 ENCOUNTER — Other Ambulatory Visit (HOSPITAL_COMMUNITY): Payer: Self-pay | Admitting: *Deleted

## 2016-08-17 ENCOUNTER — Encounter (HOSPITAL_COMMUNITY): Payer: Managed Care, Other (non HMO)

## 2016-08-17 ENCOUNTER — Ambulatory Visit (HOSPITAL_COMMUNITY)
Admission: RE | Admit: 2016-08-17 | Discharge: 2016-08-17 | Disposition: A | Discharge status: Home / Self Care | Payer: Managed Care, Other (non HMO) | Source: Ambulatory Visit | Attending: Family Medicine | Admitting: Family Medicine

## 2016-08-17 ENCOUNTER — Encounter (HOSPITAL_COMMUNITY): Payer: Self-pay

## 2016-08-17 DIAGNOSIS — D509 Iron deficiency anemia, unspecified: Secondary | ICD-10-CM | POA: Diagnosis present

## 2016-08-17 HISTORY — DX: Anemia, unspecified: D64.9

## 2016-08-17 MED ORDER — SODIUM CHLORIDE 0.9 % IV SOLN
510.0000 mg | INTRAVENOUS | Status: DC
  Administered 2016-08-17: 510 mg via INTRAVENOUS
  Filled 2016-08-17: qty 17

## 2016-08-17 NOTE — Progress Notes (Signed)
Pt tolerated feraheme infusion well, VSS at DC, Pt DC home without complaint.

## 2016-08-17 NOTE — Discharge Instructions (Signed)

## 2016-08-24 ENCOUNTER — Ambulatory Visit (HOSPITAL_COMMUNITY)
Admission: RE | Admit: 2016-08-24 | Discharge: 2016-08-24 | Disposition: A | Discharge status: Home / Self Care | Payer: Managed Care, Other (non HMO) | Source: Ambulatory Visit | Attending: Family Medicine | Admitting: Family Medicine

## 2016-08-24 DIAGNOSIS — D509 Iron deficiency anemia, unspecified: Secondary | ICD-10-CM | POA: Insufficient documentation

## 2016-08-24 MED ORDER — SODIUM CHLORIDE 0.9 % IV SOLN
510.0000 mg | INTRAVENOUS | Status: AC
  Administered 2016-08-24: 510 mg via INTRAVENOUS
  Filled 2016-08-24: qty 17

## 2016-09-19 ENCOUNTER — Ambulatory Visit (INDEPENDENT_AMBULATORY_CARE_PROVIDER_SITE_OTHER): Payer: Managed Care, Other (non HMO) | Admitting: Women's Health

## 2016-09-19 ENCOUNTER — Encounter: Payer: Self-pay | Admitting: Women's Health

## 2016-09-19 VITALS — BP 130/80 | Ht 64.0 in | Wt 184.0 lb

## 2016-09-19 DIAGNOSIS — N760 Acute vaginitis: Secondary | ICD-10-CM

## 2016-09-19 DIAGNOSIS — R350 Frequency of micturition: Secondary | ICD-10-CM | POA: Diagnosis not present

## 2016-09-19 DIAGNOSIS — Z113 Encounter for screening for infections with a predominantly sexual mode of transmission: Secondary | ICD-10-CM

## 2016-09-19 DIAGNOSIS — B9689 Other specified bacterial agents as the cause of diseases classified elsewhere: Secondary | ICD-10-CM | POA: Diagnosis not present

## 2016-09-19 DIAGNOSIS — N898 Other specified noninflammatory disorders of vagina: Secondary | ICD-10-CM

## 2016-09-19 LAB — URINALYSIS W MICROSCOPIC + REFLEX CULTURE
BILIRUBIN URINE: NEGATIVE
CASTS: NONE SEEN [LPF]
CRYSTALS: NONE SEEN [HPF]
GLUCOSE, UA: NEGATIVE
Leukocytes, UA: NEGATIVE
Nitrite: NEGATIVE
SPECIFIC GRAVITY, URINE: 1.025 (ref 1.001–1.035)
Yeast: NONE SEEN [HPF]
pH: 7 (ref 5.0–8.0)

## 2016-09-19 LAB — WET PREP FOR TRICH, YEAST, CLUE
Trich, Wet Prep: NONE SEEN
WBC WET PREP: NONE SEEN
Yeast Wet Prep HPF POC: NONE SEEN

## 2016-09-19 MED ORDER — METRONIDAZOLE 500 MG PO TABS: 500.0000 mg | ORAL_TABLET | Freq: Two times a day (BID) | ORAL | 0 refills | Status: DC

## 2016-09-19 MED ORDER — FLUCONAZOLE 150 MG PO TABS: 150.0000 mg | ORAL_TABLET | Freq: Once | ORAL | 1 refills | Status: AC

## 2016-09-19 NOTE — Patient Instructions (Signed)

## 2016-09-19 NOTE — Progress Notes (Signed)
Presents with complaint of external vaginal itching, continues with heavy monthly cycles history of fibroid. Has had iron infusion and has follow-up scheduled last hemoglobin 8, up from 6.. New partner, recently deceased from allergic reaction. Denies abdominal pain, fever, vaginal odor.  Exam: Tearful from recent loss. External genitalia within normal limits, speculum exam moderate amount of menses noted with odor, wet prep positive for clues, many bacteria. GC/Chlamydia culture taken. Bimanual uterus enlarged history of a 6 cm fibroid. Nontender.  Bacteria vaginosis Std screen Anemia from menorrhagia/fibroid uterus  Plan: Flagyl 500 twice daily for 7 days, alcohol precautions reviewed. Diflucan 150 times one dose if vaginal itching persists. GC/Chlamydia culture pending, will check HIV, hepatitis and RPR at annual exam will schedule. Keep scheduled follow-up with hematologist for iron infusions. Condolences given.

## 2016-09-19 NOTE — Addendum Note (Signed)
Addended by: Burnett Kanaris on: 09/19/2016 12:49 PM   Modules accepted: Orders

## 2016-09-20 LAB — GC/CHLAMYDIA PROBE AMP
CT Probe RNA: NOT DETECTED
GC Probe RNA: NOT DETECTED

## 2016-09-20 LAB — URINE CULTURE

## 2016-10-03 ENCOUNTER — Other Ambulatory Visit: Payer: Self-pay | Admitting: Women's Health

## 2016-10-03 DIAGNOSIS — Z1231 Encounter for screening mammogram for malignant neoplasm of breast: Secondary | ICD-10-CM

## 2016-10-10 DIAGNOSIS — C50919 Malignant neoplasm of unspecified site of unspecified female breast: Secondary | ICD-10-CM

## 2016-10-10 HISTORY — DX: Malignant neoplasm of unspecified site of unspecified female breast: C50.919

## 2016-10-12 ENCOUNTER — Ambulatory Visit (INDEPENDENT_AMBULATORY_CARE_PROVIDER_SITE_OTHER): Payer: Managed Care, Other (non HMO) | Admitting: Women's Health

## 2016-10-12 ENCOUNTER — Encounter: Payer: Self-pay | Admitting: Women's Health

## 2016-10-12 VITALS — BP 126/80 | Ht 64.0 in | Wt 171.0 lb

## 2016-10-12 DIAGNOSIS — Z113 Encounter for screening for infections with a predominantly sexual mode of transmission: Secondary | ICD-10-CM

## 2016-10-12 DIAGNOSIS — Z13220 Encounter for screening for lipoid disorders: Secondary | ICD-10-CM | POA: Diagnosis not present

## 2016-10-12 DIAGNOSIS — R21 Rash and other nonspecific skin eruption: Secondary | ICD-10-CM | POA: Diagnosis not present

## 2016-10-12 DIAGNOSIS — Z01419 Encounter for gynecological examination (general) (routine) without abnormal findings: Secondary | ICD-10-CM

## 2016-10-12 LAB — COMPREHENSIVE METABOLIC PANEL
ALK PHOS: 51 U/L (ref 33–115)
ALT: 13 U/L (ref 6–29)
AST: 13 U/L (ref 10–30)
Albumin: 4.2 g/dL (ref 3.6–5.1)
BUN: 6 mg/dL — ABNORMAL LOW (ref 7–25)
CALCIUM: 9.4 mg/dL (ref 8.6–10.2)
CHLORIDE: 107 mmol/L (ref 98–110)
CO2: 21 mmol/L (ref 20–31)
Creat: 0.65 mg/dL (ref 0.50–1.10)
Glucose, Bld: 86 mg/dL (ref 65–99)
POTASSIUM: 4.4 mmol/L (ref 3.5–5.3)
Sodium: 139 mmol/L (ref 135–146)
TOTAL PROTEIN: 7 g/dL (ref 6.1–8.1)
Total Bilirubin: 0.6 mg/dL (ref 0.2–1.2)

## 2016-10-12 LAB — LIPID PANEL
CHOL/HDL RATIO: 3.1 ratio (ref <=5.0)
CHOLESTEROL: 141 mg/dL (ref 125–200)
HDL: 45 mg/dL — ABNORMAL LOW (ref >=46)
LDL Cholesterol: 84 mg/dL (ref <130)
Triglycerides: 60 mg/dL (ref <150)
VLDL: 12 mg/dL (ref <30)

## 2016-10-12 MED ORDER — NYSTATIN-TRIAMCINOLONE 100000-0.1 UNIT/GM-% EX OINT: 1.0000 "application " | TOPICAL_OINTMENT | Freq: Two times a day (BID) | CUTANEOUS | 0 refills | Status: DC

## 2016-10-12 NOTE — Progress Notes (Signed)
Donna Matthews 08-28-1976 [id number]    History:    Presents for annual exam.  Monthly cycle not sexually active. History of an ectopic and SAB. Monthly 7 day cycle 3 of which are heavy. History of a 6 cm fundal fibroid. 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 Normal mammogram history. 03/2015 normal sonohysterogram with negative biopsy.  Past medical history, past surgical history, family history and social history were all reviewed and documented in the EPIC chart. Works in Insurance underwriter. Mother, sister diabetes, hypertension. Father died from  MI at age 31.  ROS:  A ROS was performed and pertinent positives and negatives are included.  Exam:  Vitals:   10/12/16 0924  BP: 126/80  Weight: 171 lb (77.6 kg)  Height: 5\' 4"  (1.626 m)   Body mass index is 29.35 kg/m.   General appearance:  Normal Thyroid:  Symmetrical, normal in size, without palpable masses or nodularity. Respiratory  Auscultation:  Clear without wheezing or rhonchi Cardiovascular  Auscultation:  Regular rate, without rubs, murmurs or gallops  Edema/varicosities:  Not grossly evident Abdominal  Soft,nontender, without masses, guarding or rebound.  Liver/spleen:  No organomegaly noted  Hernia:  None appreciated  Skin  Inspection:  Grossly normal 3 cm erythematous rash left suprapubic area   Breasts: Examined lying and sitting.     Right: Without masses, retractions, discharge or axillary adenopathy.     Left: Without masses, retractions, discharge or axillary adenopathy. Gentitourinary   Inguinal/mons:  Normal without inguinal adenopathy  External genitalia:  Normal  BUS/Urethra/Skene's glands:  Normal  Vagina:  Normal  Cervix:  Normal  Uterus:  16 wk size/fibroid.  Midline and mobile  Adnexa/parametria:     Rt: Without masses or tenderness.   Lt: Without masses or tenderness.  Anus and perineum: Normal  Digital rectal exam: Normal sphincter tone without palpated masses or tenderness  Assessment/Plan:  40 y.o. SBF  G2 P0 for annual exam with no complaints.  Monthly 7 day cycle Fibroid uterus/menorrhagia STD screen per request Anemia-hematologist managing with iron infusions  Plan: Mycolog ointment to 3 cm erythematous area on left suprapubic area, dermatology if no relief of rash.. SBE's, continue annual 3-D screening mammogram history of dense breasts. Continue healthy lifestyle of diet and exercise has lost 14 pounds, congratulated. Iron rich foods, calcium rich foods, vitamin D 1000 daily encouraged. CMP, lipid panel, vitamin D, UA, Pap with HR HPV typing, new screening guidelines reviewed, GC/Chlamydia, HIV, hep B, C, RPR. Declines contraception management.    Huel Cote WHNP, 10:09 AM 10/12/2016

## 2016-10-12 NOTE — Addendum Note (Signed)
Addended by: Burnett Kanaris on: 10/12/2016 11:00 AM   Modules accepted: Orders

## 2016-10-12 NOTE — Patient Instructions (Signed)
Health Maintenance, Female Adopting a healthy lifestyle and getting preventive care can go a long way to promote health and wellness. Talk with your health care provider about what schedule of regular examinations is right for you. This is a good chance for you to check in with your provider about disease prevention and staying healthy. In between checkups, there are plenty of things you can do on your own. Experts have done a lot of research about which lifestyle changes and preventive measures are most likely to keep you healthy. Ask your health care provider for more information. WEIGHT AND DIET  Eat a healthy diet  Be sure to include plenty of vegetables, fruits, low-fat dairy products, and lean protein.  Do not eat a lot of foods high in solid fats, added sugars, or salt.  Get regular exercise. This is one of the most important things you can do for your health.  Most adults should exercise for at least 150 minutes each week. The exercise should increase your heart rate and make you sweat (moderate-intensity exercise).  Most adults should also do strengthening exercises at least twice a week. This is in addition to the moderate-intensity exercise.  Maintain a healthy weight  Body mass index (BMI) is a measurement that can be used to identify possible weight problems. It estimates body fat based on height and weight. Your health care provider can help determine your BMI and help you achieve or maintain a healthy weight.  For females 20 years of age and older:   A BMI below 18.5 is considered underweight.  A BMI of 18.5 to 24.9 is normal.  A BMI of 25 to 29.9 is considered overweight.  A BMI of 30 and above is considered obese.  Watch levels of cholesterol and blood lipids  You should start having your blood tested for lipids and cholesterol at 42 years of age, then have this test every 5 years.  You may need to have your cholesterol levels checked more often if:  Your lipid  or cholesterol levels are high.  You are older than 42 years of age.  You are at high risk for heart disease.  CANCER SCREENING   Lung Cancer  Lung cancer screening is recommended for adults 55-80 years old who are at high risk for lung cancer because of a history of smoking.  A yearly low-dose CT scan of the lungs is recommended for people who:  Currently smoke.  Have quit within the past 15 years.  Have at least a 30-pack-year history of smoking. A pack year is smoking an average of one pack of cigarettes a day for 1 year.  Yearly screening should continue until it has been 15 years since you quit.  Yearly screening should stop if you develop a health problem that would prevent you from having lung cancer treatment.  Breast Cancer  Practice breast self-awareness. This means understanding how your breasts normally appear and feel.  It also means doing regular breast self-exams. Let your health care provider know about any changes, no matter how small.  If you are in your 20s or 30s, you should have a clinical breast exam (CBE) by a health care provider every 1-3 years as part of a regular health exam.  If you are 40 or older, have a CBE every year. Also consider having a breast X-ray (mammogram) every year.  If you have a family history of breast cancer, talk to your health care provider about genetic screening.  If you   are at high risk for breast cancer, talk to your health care provider about having an MRI and a mammogram every year.  Breast cancer gene (BRCA) assessment is recommended for women who have family members with BRCA-related cancers. BRCA-related cancers include:  Breast.  Ovarian.  Tubal.  Peritoneal cancers.  Results of the assessment will determine the need for genetic counseling and BRCA1 and BRCA2 testing. Cervical Cancer Your health care provider may recommend that you be screened regularly for cancer of the pelvic organs (ovaries, uterus, and  vagina). This screening involves a pelvic examination, including checking for microscopic changes to the surface of your cervix (Pap test). You may be encouraged to have this screening done every 3 years, beginning at age 21.  For women ages 30-65, health care providers may recommend pelvic exams and Pap testing every 3 years, or they may recommend the Pap and pelvic exam, combined with testing for human papilloma virus (HPV), every 5 years. Some types of HPV increase your risk of cervical cancer. Testing for HPV may also be done on women of any age with unclear Pap test results.  Other health care providers may not recommend any screening for nonpregnant women who are considered low risk for pelvic cancer and who do not have symptoms. Ask your health care provider if a screening pelvic exam is right for you.  If you have had past treatment for cervical cancer or a condition that could lead to cancer, you need Pap tests and screening for cancer for at least 20 years after your treatment. If Pap tests have been discontinued, your risk factors (such as having a new sexual partner) need to be reassessed to determine if screening should resume. Some women have medical problems that increase the chance of getting cervical cancer. In these cases, your health care provider may recommend more frequent screening and Pap tests. Colorectal Cancer  This type of cancer can be detected and often prevented.  Routine colorectal cancer screening usually begins at 42 years of age and continues through 42 years of age.  Your health care provider may recommend screening at an earlier age if you have risk factors for colon cancer.  Your health care provider may also recommend using home test kits to check for hidden blood in the stool.  A small camera at the end of a tube can be used to examine your colon directly (sigmoidoscopy or colonoscopy). This is done to check for the earliest forms of colorectal  cancer.  Routine screening usually begins at age 50.  Direct examination of the colon should be repeated every 5-10 years through 42 years of age. However, you may need to be screened more often if early forms of precancerous polyps or small growths are found. Skin Cancer  Check your skin from head to toe regularly.  Tell your health care provider about any new moles or changes in moles, especially if there is a change in a mole's shape or color.  Also tell your health care provider if you have a mole that is larger than the size of a pencil eraser.  Always use sunscreen. Apply sunscreen liberally and repeatedly throughout the day.  Protect yourself by wearing long sleeves, pants, a wide-brimmed hat, and sunglasses whenever you are outside. HEART DISEASE, DIABETES, AND HIGH BLOOD PRESSURE   High blood pressure causes heart disease and increases the risk of stroke. High blood pressure is more likely to develop in:  People who have blood pressure in the high end   of the normal range (130-139/85-89 mm Hg).  People who are overweight or obese.  People who are African American.  If you are 38-23 years of age, have your blood pressure checked every 3-5 years. If you are 61 years of age or older, have your blood pressure checked every year. You should have your blood pressure measured twice--once when you are at a hospital or clinic, and once when you are not at a hospital or clinic. Record the average of the two measurements. To check your blood pressure when you are not at a hospital or clinic, you can use:  An automated blood pressure machine at a pharmacy.  A home blood pressure monitor.  If you are between 45 years and 39 years old, ask your health care provider if you should take aspirin to prevent strokes.  Have regular diabetes screenings. This involves taking a blood sample to check your fasting blood sugar level.  If you are at a normal weight and have a low risk for diabetes,  have this test once every three years after 42 years of age.  If you are overweight and have a high risk for diabetes, consider being tested at a younger age or more often. PREVENTING INFECTION  Hepatitis B  If you have a higher risk for hepatitis B, you should be screened for this virus. You are considered at high risk for hepatitis B if:  You were born in a country where hepatitis B is common. Ask your health care provider which countries are considered high risk.  Your parents were born in a high-risk country, and you have not been immunized against hepatitis B (hepatitis B vaccine).  You have HIV or AIDS.  You use needles to inject street drugs.  You live with someone who has hepatitis B.  You have had sex with someone who has hepatitis B.  You get hemodialysis treatment.  You take certain medicines for conditions, including cancer, organ transplantation, and autoimmune conditions. Hepatitis C  Blood testing is recommended for:  Everyone born from [birth year] through 1967.  Anyone with known risk factors for hepatitis C. Sexually transmitted infections (STIs)  You should be screened for sexually transmitted infections (STIs) including gonorrhea and chlamydia if:  You are sexually active and are younger than 42 years of age.  You are older than 42 years of age and your health care provider tells you that you are at risk for this type of infection.  Your sexual activity has changed since you were last screened and you are at an increased risk for chlamydia or gonorrhea. Ask your health care provider if you are at risk.  If you do not have HIV, but are at risk, it may be recommended that you take a prescription medicine daily to prevent HIV infection. This is called pre-exposure prophylaxis (PrEP). You are considered at risk if:  You are sexually active and do not regularly use condoms or know the HIV status of your partner(s).  You take drugs by injection.  You are sexually  active with a partner who has HIV. Talk with your health care provider about whether you are at high risk of being infected with HIV. If you choose to begin PrEP, you should first be tested for HIV. You should then be tested every 3 months for as long as you are taking PrEP.  PREGNANCY   If you are premenopausal and you may become pregnant, ask your health care provider about preconception counseling.  If you may  become pregnant, take 400 to 800 micrograms (mcg) of folic acid every day.  If you want to prevent pregnancy, talk to your health care provider about birth control (contraception). OSTEOPOROSIS AND MENOPAUSE   Osteoporosis is a disease in which the bones lose minerals and strength with aging. This can result in serious bone fractures. Your risk for osteoporosis can be identified using a bone density scan.  If you are 61 years of age or older, or if you are at risk for osteoporosis and fractures, ask your health care provider if you should be screened.  Ask your health care provider whether you should take a calcium or vitamin D supplement to lower your risk for osteoporosis.  Menopause may have certain physical symptoms and risks.  Hormone replacement therapy may reduce some of these symptoms and risks. Talk to your health care provider about whether hormone replacement therapy is right for you.  HOME CARE INSTRUCTIONS   Schedule regular health, dental, and eye exams.  Stay current with your immunizations.   Do not use any tobacco products including cigarettes, chewing tobacco, or electronic cigarettes.  If you are pregnant, do not drink alcohol.  If you are breastfeeding, limit how much and how often you drink alcohol.  Limit alcohol intake to no more than 1 drink per day for nonpregnant women. One drink equals 12 ounces of beer, 5 ounces of wine, or 1 ounces of hard liquor.  Do not use street drugs.  Do not share needles.  Ask your health care provider for help if  you need support or information about quitting drugs.  Tell your health care provider if you often feel depressed.  Tell your health care provider if you have ever been abused or do not feel safe at home.   This information is not intended to replace advice given to you by your health care provider. Make sure you discuss any questions you have with your health care provider.   Document Released: 06/11/2011 Document Revised: 12/17/2014 Document Reviewed: 10/28/2013 Elsevier Interactive Patient Education [employer].

## 2016-10-13 LAB — URINALYSIS W MICROSCOPIC + REFLEX CULTURE
BACTERIA UA: NONE SEEN [HPF]
BILIRUBIN URINE: NEGATIVE
CRYSTALS: NONE SEEN [HPF]
Casts: NONE SEEN [LPF]
GLUCOSE, UA: NEGATIVE
Hgb urine dipstick: NEGATIVE
KETONES UR: NEGATIVE
LEUKOCYTES UA: NEGATIVE
Nitrite: NEGATIVE
PROTEIN: NEGATIVE
RBC / HPF: NONE SEEN RBC/HPF (ref <=2)
Specific Gravity, Urine: 1.014 (ref 1.001–1.035)
Squamous Epithelial / LPF: NONE SEEN [HPF] (ref <=5)
WBC UA: NONE SEEN WBC/HPF (ref <=5)
Yeast: NONE SEEN [HPF]
pH: 7 (ref 5.0–8.0)

## 2016-10-13 LAB — HEPATITIS B SURFACE ANTIGEN: HEP B S AG: NEGATIVE

## 2016-10-13 LAB — GC/CHLAMYDIA PROBE AMP
CT PROBE, AMP APTIMA: NOT DETECTED
GC PROBE AMP APTIMA: NOT DETECTED

## 2016-10-13 LAB — VITAMIN D 25 HYDROXY (VIT D DEFICIENCY, FRACTURES): VIT D 25 HYDROXY: 15 ng/mL — AB (ref 30–100)

## 2016-10-13 LAB — RPR

## 2016-10-13 LAB — HIV ANTIBODY (ROUTINE TESTING W REFLEX): HIV: NONREACTIVE

## 2016-10-13 LAB — HEPATITIS C ANTIBODY: HCV Ab: NEGATIVE

## 2016-10-16 ENCOUNTER — Other Ambulatory Visit: Payer: Self-pay | Admitting: Women's Health

## 2016-10-16 DIAGNOSIS — E559 Vitamin D deficiency, unspecified: Secondary | ICD-10-CM

## 2016-10-16 MED ORDER — VITAMIN D (ERGOCALCIFEROL) 1.25 MG (50000 UNIT) PO CAPS: 50000.0000 [IU] | ORAL_CAPSULE | ORAL | 0 refills | Status: DC

## 2016-10-17 LAB — PAP, TP IMAGING W/ HPV RNA, RFLX HPV TYPE 16,18/45: HPV mRNA, High Risk: NOT DETECTED

## 2016-10-25 ENCOUNTER — Ambulatory Visit
Admission: RE | Admit: 2016-10-25 | Discharge: 2016-10-25 | Disposition: A | Discharge status: Home / Self Care | Payer: Managed Care, Other (non HMO) | Source: Ambulatory Visit | Attending: Women's Health | Admitting: Women's Health

## 2016-10-25 DIAGNOSIS — Z1231 Encounter for screening mammogram for malignant neoplasm of breast: Secondary | ICD-10-CM

## 2016-10-31 ENCOUNTER — Other Ambulatory Visit: Payer: Self-pay | Admitting: Women's Health

## 2016-10-31 DIAGNOSIS — R928 Other abnormal and inconclusive findings on diagnostic imaging of breast: Secondary | ICD-10-CM

## 2016-11-06 ENCOUNTER — Other Ambulatory Visit: Payer: Self-pay | Admitting: Women's Health

## 2016-11-06 ENCOUNTER — Other Ambulatory Visit: Payer: Self-pay

## 2016-11-06 DIAGNOSIS — R928 Other abnormal and inconclusive findings on diagnostic imaging of breast: Secondary | ICD-10-CM

## 2016-11-07 ENCOUNTER — Other Ambulatory Visit: Payer: Self-pay | Admitting: Women's Health

## 2016-11-07 ENCOUNTER — Ambulatory Visit
Admission: RE | Admit: 2016-11-07 | Discharge: 2016-11-07 | Disposition: A | Discharge status: Home / Self Care | Payer: Managed Care, Other (non HMO) | Source: Ambulatory Visit | Attending: Women's Health | Admitting: Women's Health

## 2016-11-07 DIAGNOSIS — R928 Other abnormal and inconclusive findings on diagnostic imaging of breast: Secondary | ICD-10-CM

## 2016-11-09 ENCOUNTER — Telehealth: Payer: Self-pay | Admitting: *Deleted

## 2016-11-09 DIAGNOSIS — C50212 Malignant neoplasm of upper-inner quadrant of left female breast: Secondary | ICD-10-CM

## 2016-11-09 NOTE — Telephone Encounter (Signed)
Confirmed [facility] for 11/14/16 at 1215pm .  Instructions and contact information given.

## 2016-11-09 NOTE — Telephone Encounter (Signed)
Mailed [facility] packet to pt. 

## 2016-11-14 ENCOUNTER — Ambulatory Visit
Admission: RE | Admit: 2016-11-14 | Discharge: 2016-11-14 | Disposition: A | Discharge status: Home / Self Care | Payer: Managed Care, Other (non HMO) | Source: Ambulatory Visit | Attending: Radiation Oncology | Admitting: Radiation Oncology

## 2016-11-14 ENCOUNTER — Encounter: Payer: Self-pay | Admitting: Hematology and Oncology

## 2016-11-14 ENCOUNTER — Other Ambulatory Visit: Payer: Self-pay | Admitting: *Deleted

## 2016-11-14 ENCOUNTER — Ambulatory Visit: Payer: Self-pay | Admitting: Surgery

## 2016-11-14 ENCOUNTER — Other Ambulatory Visit (HOSPITAL_BASED_OUTPATIENT_CLINIC_OR_DEPARTMENT_OTHER): Payer: Managed Care, Other (non HMO)

## 2016-11-14 ENCOUNTER — Encounter: Payer: Self-pay | Admitting: Physical Therapy

## 2016-11-14 ENCOUNTER — Ambulatory Visit: Payer: Managed Care, Other (non HMO) | Attending: Surgery | Admitting: Physical Therapy

## 2016-11-14 ENCOUNTER — Ambulatory Visit (HOSPITAL_BASED_OUTPATIENT_CLINIC_OR_DEPARTMENT_OTHER): Payer: Managed Care, Other (non HMO) | Admitting: Hematology and Oncology

## 2016-11-14 DIAGNOSIS — Z170 Estrogen receptor positive status [ER+]: Secondary | ICD-10-CM | POA: Insufficient documentation

## 2016-11-14 DIAGNOSIS — D0512 Intraductal carcinoma in situ of left breast: Secondary | ICD-10-CM

## 2016-11-14 DIAGNOSIS — C50212 Malignant neoplasm of upper-inner quadrant of left female breast: Secondary | ICD-10-CM

## 2016-11-14 DIAGNOSIS — R293 Abnormal posture: Secondary | ICD-10-CM

## 2016-11-14 LAB — CBC WITH DIFFERENTIAL/PLATELET
BASO%: 0.2 % (ref 0.0–2.0)
Basophils Absolute: 0 10*3/uL (ref 0.0–0.1)
EOS ABS: 0.1 10*3/uL (ref 0.0–0.5)
EOS%: 1.5 % (ref 0.0–7.0)
HEMATOCRIT: 24.7 % — AB (ref 34.8–46.6)
HGB: 8.2 g/dL — ABNORMAL LOW (ref 11.6–15.9)
LYMPH#: 1.7 10*3/uL (ref 0.9–3.3)
LYMPH%: 34.9 % (ref 14.0–49.7)
MCH: 24.4 pg — ABNORMAL LOW (ref 25.1–34.0)
MCHC: 33.2 g/dL (ref 31.5–36.0)
MCV: 73.5 fL — AB (ref 79.5–101.0)
MONO#: 0.7 10*3/uL (ref 0.1–0.9)
MONO%: 13.7 % (ref 0.0–14.0)
NEUT%: 49.7 % (ref 38.4–76.8)
NEUTROS ABS: 2.4 10*3/uL (ref 1.5–6.5)
PLATELETS: 277 10*3/uL (ref 145–400)
RBC: 3.36 10*6/uL — ABNORMAL LOW (ref 3.70–5.45)
RDW: 17.5 % — ABNORMAL HIGH (ref 11.2–14.5)
WBC: 4.7 10*3/uL (ref 3.9–10.3)

## 2016-11-14 LAB — COMPREHENSIVE METABOLIC PANEL
ALK PHOS: 64 U/L (ref 40–150)
ALT: 10 U/L (ref 0–55)
ANION GAP: 7 meq/L (ref 3–11)
AST: 11 U/L (ref 5–34)
Albumin: 3.5 g/dL (ref 3.5–5.0)
BILIRUBIN TOTAL: 0.42 mg/dL (ref 0.20–1.20)
BUN: 8.3 mg/dL (ref 7.0–26.0)
CALCIUM: 8.9 mg/dL (ref 8.4–10.4)
CO2: 25 mEq/L (ref 22–29)
CREATININE: 0.7 mg/dL (ref 0.6–1.1)
Chloride: 108 mEq/L (ref 98–109)
Glucose: 100 mg/dl (ref 70–140)
Potassium: 3.9 mEq/L (ref 3.5–5.1)
Sodium: 141 mEq/L (ref 136–145)
TOTAL PROTEIN: 6.7 g/dL (ref 6.4–8.3)

## 2016-11-14 NOTE — Assessment & Plan Note (Signed)
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 It is considered a precancerous lesion. DCIS is classified as a 0. It is generally detected through mammograms as calcifications. We discussed the significance of grades and its impact on prognosis. We also discussed the importance of ER and PR receptors and their implications to adjuvant treatment options. Prognosis of DCIS dependence on grade, comedo necrosis. It is anticipated that if not treated, 20-30% of DCIS can develop into invasive breast cancer.  Recommendation: 1. Breast conserving surgery 2. Followed by adjuvant radiation therapy 3. Followed by antiestrogen therapy with tamoxifen 5 years  Tamoxifen counseling: We discussed the risks and benefits of tamoxifen. These include but not limited to insomnia, hot flashes, mood changes, vaginal dryness, and weight gain. Although rare, serious side effects including endometrial cancer, risk of blood clots were also discussed. We strongly believe that the benefits far outweigh the risks. Patient understands these risks and consented to starting treatment. Planned treatment duration is 5 years.  Return to clinic after surgery to discuss the final pathology report and come up with an adjuvant treatment plan.

## 2016-11-14 NOTE — Progress Notes (Signed)
Nutrition Assessment  Reason for Assessment:  Pt seen in Breast Clinic  ASSESSMENT:   40 year old female with new diagnosis of left breast cancer.  Past medical history reviewed.  Patient reports normal intake.  Medications:  Fe, Vit D, MVI  Labs: reviewed  Anthropometrics:   Height: 64 inches Weight: 167 lb BMI: 28.7   NUTRITION DIAGNOSIS: Food and nutrition related knowledge deficit related to new diagnosis of breast cancer as evidenced by no prior need for nutrition related information.  INTERVENTION:   Discussed and provided packet of information regarding nutritional tips for breast cancer patients.  Questions answered.  Teachback method used.      MONITORING, EVALUATION, and GOAL: Pt will consume a healthy plant based diet to maintain lean body mass throughout treatment.   Donna Matthews B. Zenia Resides, [place], [place] (pager)

## 2016-11-14 NOTE — Progress Notes (Signed)
Radiation Oncology         [phone] ________________________________  Name: Donna Matthews MRN: [id number]  Date: 11/14/2016  DOB: 1976/07/23  [id number], Donna G, PA  Donna Matthews, Donna Moores, MD     REFERRING PHYSICIAN: [name], MD  DIAGNOSIS: The encounter diagnosis was Malignant neoplasm of upper-inner quadrant of left breast in female, estrogen receptor positive ([place]).   HISTORY OF PRESENT ILLNESS: Donna Matthews is a 40 y.o. female seen in the multidisciplinary breast clinic for a new diagnosis of left breast cancer. She underwent screening mammogram which revealed left breast distortion and calcifications. 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 There was questionable concern for invasive disease. She comes today to discuss the options for treatment of her disease.   PREVIOUS RADIATION THERAPY: No   PAST MEDICAL HISTORY:  Past Medical History:  Diagnosis Date  . Anemia   . Ectopic pregnancy   . Reflux   . STD (sexually transmitted disease)    Chlamydia       PAST SURGICAL HISTORY: Past Surgical History:  Procedure Laterality Date  . DILATION AND CURETTAGE OF UTERUS       FAMILY HISTORY:  Family History  Problem Relation Age of Onset  . Diabetes Mother   . Hypertension Mother   . Heart disease Father   . Diabetes Sister   . Hypertension Sister      SOCIAL HISTORY:  reports that she has never smoked. She has never used smokeless tobacco. She reports that she does not drink alcohol or use drugs. The patient is single and resides in [place]. 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   ALLERGIES: Bee venom and Penicillins   MEDICATIONS:  Current Outpatient Prescriptions  Medication Sig Dispense Refill  . IRON PO Take 1 tablet by mouth daily.     . Multiple Vitamin (MULTIVITAMIN) tablet Take  1 tablet by mouth daily.    Marland Kitchen nystatin-triamcinolone ointment (MYCOLOG) Apply 1 application topically 2 (two) times daily. (Patient not taking: Reported on 11/14/2016) 60 Matthews 0  . Vitamin D, Ergocalciferol, (DRISDOL) 50000 units CAPS capsule Take 1 capsule (50,000 Units total) by mouth every 7 (seven) days. 12 capsule 0   No current facility-administered medications for this encounter.      REVIEW OF SYSTEMS: On review of systems, the patient reports that she is doing well overall. She denies any chest pain, shortness of breath, cough, fevers, chills, night sweats, unintended weight changes. She denies any bowel or bladder disturbances, and denies abdominal pain, nausea or vomiting. She denies any new musculoskeletal or joint aches or pains. A complete review of systems is obtained and is otherwise negative.     PHYSICAL EXAM:  Wt Readings from Last 3 Encounters:  11/14/16 167 lb 1.6 oz (75.8 kg)  10/12/16 171 lb (77.6 kg)  09/19/16 184 lb (83.5 kg)   Temp Readings from Last 3 Encounters:  11/14/16 98.5 F (36.9 C) (Oral)  08/24/16 98.5 F (36.9 C) (Oral)  08/17/16 99.4 F (37.4 C) (Oral)   BP Readings from Last 3 Encounters:  11/14/16 137/69  10/12/16 126/80  09/19/16 130/80   Pulse Readings from Last 3 Encounters:  11/14/16 75  08/24/16 74  08/17/16 79     Pain scale 0/10 In general this is a well appearing African American female in no acute distress. She  is alert and oriented x4 and appropriate throughout the examination. HEENT reveals that the patient is normocephalic, atraumatic. EOMs are intact. PERRLA. Skin is intact without any evidence of gross lesions. Cardiovascular exam reveals a regular rate and rhythm, no clicks rubs or murmurs are auscultated. Chest is clear to auscultation bilaterally. Lymphatic assessment is performed and does not reveal any adenopathy in the cervical, supraclavicular, axillary, or inguinal chains. Bilateral breast exam reveals post biopsy changes  at the site of her previous biopsy site. No palpables mass is noted but there is induration deep to the biopsy site. The right breast is negative for masses, and neither breast has nipple bleeding, or discharge. Abdomen has active bowel sounds in all quadrants and is intact. The abdomen is soft, non tender, non distended. Lower extremities are negative for pretibial pitting edema, deep calf tenderness, cyanosis or clubbing.   ECOG = 0  0 - Asymptomatic (Fully active, able to carry on all predisease activities without restriction)  1 - Symptomatic but completely ambulatory (Restricted in physically strenuous activity but ambulatory and able to carry out work of a light or sedentary nature. For example, light housework, office work)  2 - Symptomatic, <50% in bed during the day (Ambulatory and capable of all self care but unable to carry out any work activities. Up and about more than 50% of waking hours)  3 - Symptomatic, >50% in bed, but not bedbound (Capable of only limited self-care, confined to bed or chair 50% or more of waking hours)  4 - Bedbound (Completely disabled. Cannot carry on any self-care. Totally confined to bed or chair)  5 - Death   Eustace Pen MM, Creech RH, Tormey DC, et al. [phone]). "Toxicity and response criteria of the [facility]". [place] Oncol. 5 (6): 649-55    LABORATORY DATA:  Lab Results  Component Value Date   WBC 4.7 11/14/2016   HGB 8.2 (L) 11/14/2016   HCT 24.7 (L) 11/14/2016   MCV 73.5 (L) 11/14/2016   PLT 277 11/14/2016   Lab Results  Component Value Date   NA 141 11/14/2016   K 3.9 11/14/2016   CL 107 10/12/2016   CO2 25 11/14/2016   Lab Results  Component Value Date   ALT 10 11/14/2016   AST 11 11/14/2016   ALKPHOS 64 11/14/2016   BILITOT 0.42 11/14/2016      RADIOGRAPHY: Mm Digital Diagnostic Unilat L  Result Date: 11/07/2016 CLINICAL DATA:  Patient returns today to evaluate left breast calcifications identified  on recent screening mammogram. EXAM: DIGITAL DIAGNOSTIC LEFT MAMMOGRAM COMPARISON:  Previous exams including recent screening mammogram dated 10/25/2016. ACR Breast Density Category c: The breast tissue is heterogeneously dense, which may obscure small masses. FINDINGS: Grouped pleomorphic calcifications are confirmed within the UPPER INNER quadrant of the left breast, with linear orientation, spanning approximately 1.2 cm. There are several additional loosely grouped punctate calcifications within the lower inner quadrant of the left breast which have a benign appearance, some of which have a layering appearance suggesting benign milk of calcium. IMPRESSION: Grouped pleomorphic calcifications within the UPPER INNER quadrant of the LEFT breast, with linear distribution, spanning approximately 1.2 cm. This is a suspicious finding for which stereotactic guided core biopsy is recommended. RECOMMENDATION: Stereotactic guided core biopsy for the calcifications in the UPPER INNER quadrant of the LEFT breast. Stereotactic biopsy will be performed later today. I have discussed the findings and recommendations with the patient. Results were also provided in writing at the conclusion  of the visit. If applicable, a reminder letter will be sent to the patient regarding the next appointment. BI-RADS CATEGORY  4: Suspicious. Electronically Signed   By: Franki Cabot M.D.   On: 11/07/2016 10:23   Mm Screening Breast Tomo Bilateral  Result Date: 10/30/2016 CLINICAL DATA:  Screening. EXAM: 2D DIGITAL SCREENING BILATERAL MAMMOGRAM WITH CAD AND ADJUNCT TOMO COMPARISON:  Previous exam(s). ACR Breast Density Category c: The breast tissue is heterogeneously dense, which may obscure small masses. FINDINGS: In the left breast, calcifications warrant further evaluation. In the right breast, no findings suspicious for malignancy. Images were processed with CAD. IMPRESSION: Further evaluation is suggested for calcifications in the left  breast. RECOMMENDATION: Diagnostic mammogram of the left breast. (Code:[id number]-[id number]) The patient will be contacted regarding the findings, and additional imaging will be scheduled. BI-RADS CATEGORY  0: Incomplete. Need additional imaging evaluation and/or prior mammograms for comparison. Electronically Signed   By: Lovey Newcomer M.D.   On: 10/30/2016 15:06   Mm Clip Placement Left  Result Date: 11/09/2016 CLINICAL DATA:  Status post stereotactic biopsy of left breast calcifications. EXAM: DIAGNOSTIC LEFT MAMMOGRAM POST STEREOTACTIC BIOPSY COMPARISON:  Previous exam(s). FINDINGS: Mammographic images were obtained following stereotactic guided biopsy of calcifications in the upper inner quadrant of the left breast. Mammographic images showed there is a ribbon shaped clip in appropriate position. IMPRESSION: Status post stereotactic biopsy of the left breast with pathology pending. Final Assessment: Post Procedure Mammograms for Marker Placement Electronically Signed   By: Lillia Mountain M.D.   On: 11/09/2016 13:45   Mm Lt Breast Bx W Loc Dev 1st Lesion Image Bx Spec Stereo Guide  Addendum Date: 11/12/2016   ADDENDUM REPORT: 11/09/2016 14:15 ADDENDUM: Pathology revealed GRADE II-III DUCTAL CARCINOMA IN SITU WITH NECROSIS AND CALCIFICATIONS, SUSPICIOUS FOR MICROSCOPIC INVASION of the upper inner quadrant of the Left breast. This was found to be concordant by Dr. Lillia Mountain. Pathology results were discussed with the patient by telephone. The patient reported doing well after the biopsy with tenderness at the site. Post biopsy instructions and care were reviewed and questions were answered. The patient was encouraged to call [facility] for any additional concerns. The patient was referred to [facility] on November 14, 2016. Pathology results reported by Terie Purser, RN on 11/09/2016. Electronically Signed   By: Lillia Mountain M.D.   On: 11/09/2016 14:15   Result Date: 11/12/2016 CLINICAL DATA:  Suspicious left breast calcifications. EXAM: LEFT BREAST STEREOTACTIC CORE NEEDLE BIOPSY COMPARISON:  Previous exams. FINDINGS: The patient and I discussed the procedure of stereotactic-guided biopsy including benefits and alternatives. We discussed the high likelihood of a successful procedure. We discussed the risks of the procedure including infection, bleeding, tissue injury, clip migration, and inadequate sampling. Informed written consent was given. The usual time out protocol was performed immediately prior to the procedure. Using sterile technique and 1% lidocaine and 1% lidocaine with epinephrine as local anesthetic, under stereotactic guidance, a 9 gauge vacuum assist device was used to perform core needle biopsy of calcifications in the upper-inner quadrant of the left breast using a superior to inferior approach. Specimen radiograph was performed showing calcifications are present in the tissue samples. Specimens with calcifications are identified for pathology. At the conclusion of the procedure, a ribbon shaped tissue marker clip was deployed into the biopsy cavity. Follow-up 2-view mammogram was performed and dictated separately. IMPRESSION: Stereotactic-guided biopsy of the left  breast. No apparent complications. Electronically Signed: By: Lillia Mountain M.D. On: 11/07/2016 16:09       IMPRESSION/PLAN: 1. ER/PR positive high grade DCIS. Dr. Lisbeth Renshaw discusses the pathology findings and reviews the nature of in situ breast disease. The consensus from the breast conference include breast conservation with lumpectomy with sentinel mapping due to the high grade component of the pathology. Provided that there is no invasive component larger than 1 cm, and that chemotherapy is not indicated, the patient's course would then be followed by external radiotherapy to the breast followed by antiestrogen therapy. We discussed the risks,  benefits, short, and long term effects of radiotherapy, and the patient is interested in proceeding. Dr. Lisbeth Renshaw discusses the delivery and logistics of radiotherapy and would recommend a course of 33 fractions of radiotherapy over 6 1/2 weeks. We will see her back about 2 weeks after surgery to move forward with the simulation and planning process and anticipate starting radiotherapy about 4 weeks after surgery.  2. Possible genetic predisposition to malignancy. The patient will meet with genetics to discuss the role for genetic testing.  The above documentation reflects my direct findings during this shared patient visit. Please see the separate note by Dr. Lisbeth Renshaw on this date for the remainder of the patient's plan of care.    Carola Rhine, PAC

## 2016-11-14 NOTE — Progress Notes (Signed)
[place] NOTE  Patient Care Team: [name], PA as PCP - General (Family Medicine) [name], MD as Consulting Physician (General Surgery) Nicholas Lose, MD as Consulting Physician (Hematology and Oncology) Kyung Rudd, MD as Consulting Physician (Radiation Oncology)  CHIEF COMPLAINTS/PURPOSE OF CONSULTATION:  Newly diagnosed breast cancer  HISTORY OF PRESENTING ILLNESS:  Donna Matthews 40 y.o. female is here because of recent diagnosis of left breast DCIS. She had a screening mammogram that revealed left breast calcifications. By mammogram and ultrasound measured 1.2 cm. Biopsy revealed intermediate to high-grade DCIS with calcifications and necrosis. There was questionable evidence of early invasion. The tumor cells were ER/PR positive. She was presented this morning at the multidisciplinary tumor board and she is here today to discuss the treatment plan.  I reviewed her records extensively and collaborated the history with the patient.  SUMMARY OF ONCOLOGIC HISTORY:   Malignant neoplasm of upper-inner quadrant of left female breast ([place])   11/07/2016 Initial Diagnosis    Left breast biopsy UIQ: DCIS with necrosis and calcifications grade 2-3, suspicion for microscopic invasion, ER 95%, PR 95%, Tis N0 stage 0; screening tomo detected left breast calcifications 1.2 cm      MEDICAL HISTORY:  Past Medical History:  Diagnosis Date  . Anemia   . Ectopic pregnancy   . Reflux   . STD (sexually transmitted disease)    Chlamydia    SURGICAL HISTORY: Past Surgical History:  Procedure Laterality Date  . DILATION AND CURETTAGE OF UTERUS      SOCIAL HISTORY: Social History   Social History  . Marital status: Single    Spouse name: N/A  . Number of children: N/A  . Years of education: N/A   Occupational History  . Not on file.   Social History Main Topics  . Smoking status: Never Smoker  . Smokeless tobacco: Never Used  . Alcohol use No  .  Drug use: No  . Sexual activity: Yes    Birth control/ protection: None   Other Topics Concern  . Not on file   Social History Narrative  . No narrative on file    FAMILY HISTORY: Family History  Problem Relation Age of Onset  . Diabetes Mother   . Hypertension Mother   . Heart disease Father   . Diabetes Sister   . Hypertension Sister     ALLERGIES:  is allergic to bee venom and penicillins.  MEDICATIONS:  Current Outpatient Prescriptions  Medication Sig Dispense Refill  . IRON PO Take 1 tablet by mouth daily.     . Multiple Vitamin (MULTIVITAMIN) tablet Take 1 tablet by mouth daily.    . Vitamin D, Ergocalciferol, (DRISDOL) 50000 units CAPS capsule Take 1 capsule (50,000 Units total) by mouth every 7 (seven) days. 12 capsule 0  . nystatin-triamcinolone ointment (MYCOLOG) Apply 1 application topically 2 (two) times daily. (Patient not taking: Reported on 11/14/2016) 60 g 0   No current facility-administered medications for this visit.     REVIEW OF SYSTEMS:   Constitutional: Denies fevers, chills or abnormal night sweats Eyes: Denies blurriness of vision, double vision or watery eyes Ears, nose, mouth, throat, and face: Denies mucositis or sore throat Respiratory: Denies cough, dyspnea or wheezes Cardiovascular: Denies palpitation, chest discomfort or lower extremity swelling Gastrointestinal:  Denies nausea, heartburn or change in bowel habits Skin: Denies abnormal skin rashes Lymphatics: Denies new lymphadenopathy or easy bruising Neurological:Denies numbness, tingling or new weaknesses Behavioral/Psych: Mood is stable, no  new changes  Breast:  Denies any palpable lumps or discharge All other systems were reviewed with the patient and are negative.  PHYSICAL EXAMINATION: ECOG PERFORMANCE STATUS: 0 - Asymptomatic  Vitals:   11/14/16 1250  BP: 137/69  Pulse: 75  Resp: 18  Temp: 98.5 F (36.9 C)   Filed Weights   11/14/16 1250  Weight: 167 lb 1.6 oz (75.8  kg)    GENERAL:alert, no distress and comfortable SKIN: skin color, texture, turgor are normal, no rashes or significant lesions EYES: normal, conjunctiva are pink and non-injected, sclera clear OROPHARYNX:no exudate, no erythema and lips, buccal mucosa, and tongue normal  NECK: supple, thyroid normal size, non-tender, without nodularity LYMPH:  no palpable lymphadenopathy in the cervical, axillary or inguinal LUNGS: clear to auscultation and percussion with normal breathing effort HEART: regular rate & rhythm and no murmurs and no lower extremity edema ABDOMEN:abdomen soft, non-tender and normal bowel sounds Musculoskeletal:no cyanosis of digits and no clubbing  PSYCH: alert & oriented x 3 with fluent speech NEURO: no focal motor/sensory deficits BREAST: No palpable nodules in breast. No palpable axillary or supraclavicular lymphadenopathy (exam performed in the presence of a chaperone)   LABORATORY DATA:  I have reviewed the data as listed Lab Results  Component Value Date   WBC 4.7 11/14/2016   HGB 8.2 (L) 11/14/2016   HCT 24.7 (L) 11/14/2016   MCV 73.5 (L) 11/14/2016   PLT 277 11/14/2016   Lab Results  Component Value Date   NA 141 11/14/2016   K 3.9 11/14/2016   CL 107 10/12/2016   CO2 25 11/14/2016    RADIOGRAPHIC STUDIES: I have personally reviewed the radiological reports and agreed with the findings in the report.  ASSESSMENT AND PLAN:  Malignant neoplasm of upper-inner quadrant of left female breast ([place]) Left breast biopsy UIQ 11/07/2016: DCIS with necrosis and calcifications grade 2-3, suspicion for microscopic invasion, ER 95%, PR 95%, Tis N0 stage 0; screening tomo detected left breast calcifications 1.2 cm  Pathology review: I discussed with the patient the difference between DCIS and invasive breast cancer. It is considered a precancerous lesion. DCIS is classified as a 0. It is generally detected through mammograms as calcifications. We discussed the  significance of grades and its impact on prognosis. We also discussed the importance of ER and PR receptors and their implications to adjuvant treatment options. Prognosis of DCIS dependence on grade, comedo necrosis. It is anticipated that if not treated, 20-30% of DCIS can develop into invasive breast cancer.  Recommendation: 1. Breast conserving surgery 2. Followed by adjuvant radiation therapy 3. Followed by antiestrogen therapy with tamoxifen 5 years  Tamoxifen counseling: We discussed the risks and benefits of tamoxifen. These include but not limited to insomnia, hot flashes, mood changes, vaginal dryness, and weight gain. Although rare, serious side effects including endometrial cancer, risk of blood clots were also discussed. We strongly believe that the benefits far outweigh the risks. Patient understands these risks and consented to starting treatment. Planned treatment duration is 5 years.  Return to clinic after surgery to discuss the final pathology report and come up with an adjuvant treatment plan.     All questions were answered. The patient knows to call the clinic with any problems, questions or concerns.    Rulon Eisenmenger, MD 11/14/16

## 2016-11-14 NOTE — Patient Instructions (Signed)

## 2016-11-14 NOTE — Therapy (Signed)
[place], [place], [address] Phone: [phone]   Fax:  [phone]  Physical Therapy Evaluation  Patient Details  Name: Donna Matthews MRN: [id number] Date of Birth: 12-28-1975 Referring Provider: Dr. [name]  Encounter Date: 11/14/2016      PT End of Session - 11/14/16 1427    Visit Number 1   Number of Visits 1   PT Start Time 0254   PT Stop Time 1407  Also saw pt from 1425 to 1437 for a total of 27 minutes   PT Time Calculation (min) 15 min   Activity Tolerance Patient tolerated treatment well   Behavior During Therapy [facility] for tasks assessed/performed      Past Medical History:  Diagnosis Date  . Anemia   . Ectopic pregnancy   . Reflux   . STD (sexually transmitted disease)    Chlamydia    Past Surgical History:  Procedure Laterality Date  . DILATION AND CURETTAGE OF UTERUS      There were no vitals filed for this visit.       Subjective Assessment - 11/14/16 1410    Subjective Patient reports she is here today for a baseline assessment of her newly diagnosed left breast cancer.   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 It is located in the upper inner quadrant and measures 1.2 cm. It is ER/PR positive.   Patient Stated Goals Reduce lymphedema risk and learn post op shoulder ROM HEP   Currently in Pain? No/denies            [facility] PT Assessment - 11/14/16 0001      Assessment   Medical Diagnosis Left breast cancer   Referring Provider Dr. Marcello Moores Corn[place]   Onset Date/Surgical Date 11/08/16   Hand Dominance Right   Prior Therapy none     Precautions   Precautions Other (comment)   Precaution Comments active cancer     Restrictions   Weight Bearing Restrictions No     Balance Screen   Has the patient fallen in the past 6 months No   Has the patient  had a decrease in activity level because of a fear of falling?  No   Is the patient reluctant to leave their home because of a fear of falling?  No     Home Ecologist residence   Living Arrangements Spouse/significant other  Lives with her partner   Available Help at Discharge Family  5 family members present today     Prior Function   Level of Independence Independent   Vocation Full time employment   Warehouse manager; desk job   Leisure She does not exercise     Cognition   Overall Cognitive Status Within Functional Limits for tasks assessed     Posture/Postural Control   Posture/Postural Control Postural limitations   Postural Limitations Rounded Shoulders;Forward head     ROM / Strength   AROM / PROM / Strength AROM;Strength     AROM   AROM Assessment Site Shoulder;Cervical   Right/Left Shoulder Right;Left   Right Shoulder Extension 55 Degrees   Right Shoulder Flexion 157 Degrees   Right Shoulder ABduction 163 Degrees   Right Shoulder Internal Rotation 65 Degrees   Right Shoulder External Rotation 87 Degrees   Left Shoulder Extension 60 Degrees   Left Shoulder Flexion  148 Degrees   Left Shoulder ABduction 165 Degrees   Left Shoulder Internal Rotation 68 Degrees   Left Shoulder External Rotation 79 Degrees   Cervical Flexion WNL   Cervical Extension WNL   Cervical - Right Side Bend WNL   Cervical - Left Side Bend WNL   Cervical - Right Rotation WNL   Cervical - Left Rotation WNL     Strength   Overall Strength Within functional limits for tasks performed           LYMPHEDEMA/ONCOLOGY QUESTIONNAIRE - 11/14/16 1425      Type   Cancer Type Left breast cancer     Lymphedema Assessments   Lymphedema Assessments Upper extremities     Right Upper Extremity Lymphedema   10 cm Proximal to Olecranon Process 30.7 cm   Olecranon Process 26.1 cm   10 cm Proximal to Ulnar Styloid Process 24.5 cm   Just Proximal to  Ulnar Styloid Process 17 cm   Across Hand at [employer] 19 cm   At [place] of 2nd Digit 5.9 cm     Left Upper Extremity Lymphedema   10 cm Proximal to Olecranon Process 31.7 cm   Olecranon Process 26 cm   10 cm Proximal to Ulnar Styloid Process 22.8 cm   Just Proximal to Ulnar Styloid Process 16.5 cm   Across Hand at [employer] 18.9 cm   At [place] of 2nd Digit 5.9 cm        Patient was instructed today in a home exercise program today for post op shoulder range of motion. These included active assist shoulder flexion in sitting, scapular retraction, wall walking with shoulder abduction, and hands behind head external rotation.  She was encouraged to do these twice a day, holding 3 seconds and repeating 5 times when permitted by her physician.          PT Education - 11/14/16 1426    Education provided Yes   Education Details Lymphedema risk reduction and post op shoulder ROM HEP   Person(s) Educated Patient;Other (comment)  5 family members   Methods Explanation;Demonstration;Handout   Comprehension Verbalized understanding;Returned demonstration              Breast Clinic Goals - 11/14/16 1430      Patient will be able to verbalize understanding of pertinent lymphedema risk reduction practices relevant to her diagnosis specifically related to skin care.   Time 1   Period Days   Status Achieved     Patient will be able to return demonstrate and/or verbalize understanding of the post-op home exercise program related to regaining shoulder range of motion.   Time 1   Period Days   Status Achieved     Patient will be able to verbalize understanding of the importance of attending the postoperative After Breast Cancer Class for further lymphedema risk reduction education and therapeutic exercise.   Time 1   Period Days   Status Achieved              Plan - 11/14/16 1427    Clinical Impression Statement Patient was diagnosed on 11/08/16 with left  intermediate to high grade DCIS breast cancer. It is located in the upper inner quadrant and measures 1.2 cm. It is ER/PR positive. Her multidisciplinary medical team met prior to her assessments to determine a recommended treatment plan.  She is planning to have an MRI, left lumpectomy with a sentinel node biopsy, radiation, and anti-estrogen therapy.  She may  benefit from post op PT to regain shoulder ROM and reduce lymphedema risk.  Due to her lack of comorbidities, her eval is of low complexity.   Rehab Potential Excellent   Clinical Impairments Affecting Rehab Potential none   PT Frequency One time visit   PT Treatment/Interventions Patient/family education;Therapeutic exercise   PT Next Visit Plan Will f/u after surgery to determine PT needs   PT Home Exercise Plan Post op shoulder ROM HEP   Consulted and Agree with Plan of Care Patient;Family member/caregiver   Family Member Consulted 5 family members      Patient will benefit from skilled therapeutic intervention in order to improve the following deficits and impairments:  Postural dysfunction, Decreased knowledge of precautions, Pain, Impaired UE functional use, Decreased range of motion  Visit Diagnosis: Carcinoma of upper-inner quadrant of left breast in female, estrogen receptor positive ([place]) - Plan: PT plan of care cert/re-cert  Abnormal posture - Plan: PT plan of care cert/re-cert   Patient will follow up at outpatient cancer rehab if needed following surgery.  If the patient requires physical therapy at that time, a specific plan will be dictated and sent to the referring physician for approval. The patient was educated today on appropriate basic range of motion exercises to begin post operatively and the importance of attending the After Breast Cancer class following surgery.  Patient was educated today on lymphedema risk reduction practices as it pertains to recommendations that will benefit the patient immediately following  surgery.  She verbalized good understanding.  No additional physical therapy is indicated at this time.      Problem List Patient Active Problem List   Diagnosis Date Noted  . Malignant neoplasm of upper-inner quadrant of left female breast ([place]) 11/09/2016  . Fibroids, submucosal 05/01/2013  . Dysmenorrhea 05/01/2013  . Anemia 05/01/2013  . Menorrhagia 04/01/2013  . Obesity (BMI 30.0-34.9) 10/31/2012   Annia Friendly, PT 11/14/16 3:28 PM  [place], [place], [address] Phone: [phone]   Fax:  [phone]  Name: AISLINN FELIZ MRN: [id number] Date of Birth: Apr 20, 1976

## 2016-11-14 NOTE — H&P (Signed)
Donna Matthews. Faas 11/14/2016 8:00 AM Location: [facility] Patient #: [id number] DOB: May 12, 1976 Undefined / Language: Cleophus Molt / Race: Black or African American Female  History of Present Illness Donna Moores A. Amadeo Coke MD; 11/14/2016 3:19 PM) Patient words: Pt sent at the request of Dr Donna Matthews for left breats microcalcifications detected on screening mammogram core bx DCIS and possible microinvasion. Pt is sore from her biopsy. She has some bruising. No other breast problems.            ADDITIONAL INFORMATION: PROGNOSTIC INDICATORS Results: IMMUNOHISTOCHEMICAL AND MORPHOMETRIC ANALYSIS PERFORMED MANUALLY Estrogen Receptor: 95%, POSITIVE, STRONG STAINING INTENSITY Progesterone Receptor: 95%, POSITIVE, STRONG STAINING INTENSITY REFERENCE RANGE ESTROGEN RECEPTOR NEGATIVE 0% POSITIVE =>1% REFERENCE RANGE PROGESTERONE RECEPTOR NEGATIVE 0% POSITIVE =>1% All controls stained appropriately Donna Cutter MD Pathologist, Electronic Signature ( Signed 11/13/2016) FINAL DIAGNOSIS Diagnosis Breast, left, needle core biopsy, UIQ DUCTAL CARCINOMA IN SITU WITH NECROSIS AND CALCIFICATIONS, GRADE 2-3 SUSPICIOUS FOR MICROSCOPIC INVASION Microscopic Comment ER and PR has been ordered. The [place] has been informed (11/08/2016). 1 of 2 FINAL for Donna Matthews, Donna Matthews ([id number]) Microscopic Comment(continued) Donna Lanius MD Pathologist, Electronic Signature (Case signed 11/08/2016) Specimen Gross and Clinical Information Specimen Comment In formalin 3:45; calcifications Specimen(s) Obtained: Breast, left, needle core biopsy, UIQ Specimen Clinical Information R/O DCIS Gross Received in formalin in an Affirm coretainer (TIF: 3:45 pm), and labeled with the patient's name and "left breast calcs, upper inner" is a 2.5 x 1.8 x 0.3 cm aggregate of clotted blood and yellow fatty cores designated as calcs submitted in block 1A-B. The remaining 3.0 x 2.8 x 0.3 cm of  clotted blood and          CLINICAL DATA: Screening. EXAM: 2D DIGITAL SCREENING BILATERAL MAMMOGRAM WITH CAD AND ADJUNCT TOMO COMPARISON: Previous exam(s). ACR Breast Density Category c: The breast tissue is heterogeneously dense, which may obscure small masses. FINDINGS: In the left breast, calcifications warrant further evaluation. In the right breast, no findings suspicious for malignancy. Images were processed with CAD. IMPRESSION: Further evaluation is suggested for calcifications in the left breast. RECOMMENDATION: Diagnostic mammogram of the left breast. (Code:[id number]) The patient will be contacted regarding the findings, and additional imaging will be scheduled. BI-RADS CATEGORY 0: Incomplete. Need additional imaging evaluation and/or prior mammograms for comparison. Electronically Signed By: Donna Matthews Matthews.D. On: 10/30/2016 15:06 .  The patient is a 40 year old female.   Other Problems Donna Pummel, RN; 11/14/2016 8:00 AM) Back Pain Breast Cancer Chest pain Gastroesophageal Reflux Disease Lump In Breast  Past Surgical History Donna Pummel, RN; 11/14/2016 8:00 AM) Breast Biopsy Left. Oral Surgery  Diagnostic Studies History Donna Pummel, RN; 11/14/2016 8:00 AM) Colonoscopy never Mammogram within last year Pap Smear 1-5 years ago  Medication History Donna Pummel, RN; 11/14/2016 8:01 AM) Medications Reconciled  Social History Donna Pummel, RN; 11/14/2016 8:00 AM) Alcohol use Occasional alcohol use. Caffeine use Carbonated beverages, Coffee, Tea. No drug use Tobacco use Never smoker.  Family History Donna Pummel, RN; 11/14/2016 8:00 AM) Alcohol Abuse Brother, Father. Arthritis Family Members In General, Mother. Bleeding disorder Mother. Cancer Family Members In General. Cerebrovascular Accident Family Members In [place] Members In General. Diabetes Mellitus Mother, Sister. Heart  Disease Father. Heart disease in female family member before age 9 Hypertension Family Members In General, Mother, Sister. Ischemic Bowel Disease Family Members In General, Mother. Migraine Headache Family Members In General. Respiratory Condition Family Members In General.  Pregnancy / Birth History Donna Spillers  [place], RN; 11/14/2016 8:00 AM) Age at menarche 26 years. Contraceptive History Oral contraceptives. Gravida 2 Maternal age 70-25 Regular periods     Review of Systems Donna Spillers Ledford RN; 11/14/2016 8:00 AM) General Present- Fatigue and Weight Loss. Not Present- Appetite Loss, Chills, Fever, Night Sweats and Weight Gain. Skin Not Present- Change in Wart/Mole, Dryness, Hives, Jaundice, New Lesions, Non-Healing Wounds, Rash and Ulcer. HEENT Present- Wears glasses/contact lenses. Not Present- Earache, Hearing Loss, Hoarseness, Nose Bleed, Oral Ulcers, Ringing in the Ears, Seasonal Allergies, Sinus Pain, Sore Throat, Visual Disturbances and Yellow Eyes. Respiratory Present- Difficulty Breathing. Not Present- Bloody sputum, Chronic Cough, Snoring and Wheezing. Breast Present- Nipple Discharge. Not Present- Breast Mass, Breast Pain and Skin Changes. Cardiovascular Not Present- Chest Pain, Difficulty Breathing Lying Down, Leg Cramps, Palpitations, Rapid Heart Rate, Shortness of Breath and Swelling of Extremities. Gastrointestinal Not Present- Abdominal Pain, Bloating, Bloody Stool, Change in Bowel Habits, Chronic diarrhea, Constipation, Difficulty Swallowing, Excessive gas, Gets full quickly at meals, Hemorrhoids, Indigestion, Nausea, Rectal Pain and Vomiting. Female Genitourinary Not Present- Frequency, Nocturia, Painful Urination, Pelvic Pain and Urgency. Musculoskeletal Not Present- Back Pain, Joint Pain, Joint Stiffness, Muscle Pain, Muscle Weakness and Swelling of Extremities. Neurological Not Present- Decreased Memory, Fainting, Headaches, Numbness, Seizures, Tingling, Tremor,  Trouble walking and Weakness. Psychiatric Not Present- Anxiety, Bipolar, Change in Sleep Pattern, Depression, Fearful and Frequent crying. Endocrine Present- Hair Changes. Not Present- Cold Intolerance, Excessive Hunger, Heat Intolerance, Hot flashes and New Diabetes. Hematology Present- Easy Bruising. Not Present- Blood Thinners, Excessive bleeding, Gland problems, HIV and Persistent Infections.   Physical Exam (Donna Opfer A. Zamere Pasternak MD; 11/14/2016 3:20 PM)  General Mental Status-Alert. General Appearance-Consistent with stated age. Hydration-Well hydrated. Voice-Normal.  Head and Neck Head-normocephalic, atraumatic with no lesions or palpable masses. Trachea-midline. Thyroid Gland Characteristics - normal size and consistency.  Eye Eyeball - Bilateral-Extraocular movements intact. Sclera/Conjunctiva - Bilateral-No scleral icterus.  Chest and Lung Exam Chest and lung exam reveals -quiet, even and easy respiratory effort with no use of accessory muscles and on auscultation, normal breath sounds, no adventitious sounds and normal vocal resonance. Inspection Chest Wall - Normal. Back - normal.  Breast Note: bruising left breats at 6 oclock  right breast normal  Cardiovascular Cardiovascular examination reveals -normal heart sounds, regular rate and rhythm with no murmurs and normal pedal pulses bilaterally.  Neurologic Neurologic evaluation reveals -alert and oriented x 3 with no impairment of recent or remote memory. Mental Status-Normal.  Musculoskeletal Normal Exam - Left-Upper Extremity Strength Normal and Lower Extremity Strength Normal. Normal Exam - Right-Upper Extremity Strength Normal and Lower Extremity Strength Normal.  Lymphatic Head & Neck  General Head & Neck Lymphatics: Bilateral - Description - Normal. Axillary  General Axillary Region: Bilateral - Description - Normal. Tenderness - Non Tender.    Assessment & Plan (Sowmya Partridge  A. 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 Pt agrees to proceed. Risk of sentinel lymph node mapping include bleeding, infection, lymphedema, shoulder pain. stiffness, dye allergy. cosmetic deformity , blood clots, death, need for more surgery. Pt agres to proceed.  Current Plans You are being scheduled for surgery - Our schedulers will call  you.  You should hear from our office's scheduling department within 5 working days about the location, date, and time of surgery. We try to make accommodations for patient's preferences in scheduling surgery, but sometimes the OR schedule or the surgeon's schedule prevents [place] from making those accommodations.  If you have not heard from our office [phone] in 5 working days, call the office and ask for your surgeon's nurse.  If you have other questions about your diagnosis, plan, or surgery, call the office and ask for your surgeon's nurse.  Pt Education - [facility] Breast Cancer Information Given - [facility] "Breast Journey" Package Pt Education - Pamphlet Given - Breast Biopsy: discussed with patient and provided information. We discussed the staging and pathophysiology of breast cancer. We discussed all of the different options for treatment for breast cancer including surgery, chemotherapy, radiation therapy, Herceptin, and antiestrogen therapy. We discussed a sentinel lymph node biopsy as she does not appear to having lymph node involvement right now. We discussed the performance of that with injection of radioactive tracer and blue dye. We discussed that she would have  an incision underneath her axillary hairline. We discussed that there is a bout a 10-20% chance of having a positive node with a sentinel lymph node biopsy and we will await the permanent pathology to make any other first further decisions in terms of her treatment. One of these options might be to return to the operating room to perform an axillary lymph node dissection. We discussed about a 1-2% risk lifetime of chronic shoulder pain as well as lymphedema associated with a sentinel lymph node biopsy. We discussed the options for treatment of the breast cancer which included lumpectomy versus a mastectomy. We discussed the performance of the lumpectomy with a wire placement. We discussed a 10-20% chance of a positive margin requiring reexcision in the operating room. We also discussed that she may need radiation therapy or antiestrogen therapy or both if she undergoes lumpectomy. We discussed the mastectomy and the postoperative care for that as well. We discussed that there is no difference in her survival whether she undergoes lumpectomy with radiation therapy or antiestrogen therapy versus a mastectomy. There is a slight difference in the local recurrence rate being 3-5% with lumpectomy and about 1% with a mastectomy. We discussed the risks of operation including bleeding, infection, possible reoperation. She understands her further therapy will be based on what her stages at the time of her operation.  Pt Education - [facility] breast cancer surgery: discussed with patient and provided information. Pt Education - [facility] Breast Biopsy HCI: discussed with patient and provided information. Pt Education - ABC (After Breast Cancer) Class Info: discussed with patient and provided information.

## 2016-11-15 NOTE — Progress Notes (Signed)
ONCBCN DISTRESS SCREENING 11/14/2016  Screening Type Initial Screening  Distress experienced in past week (1-10) 5  Emotional problem type Nervousness/Anxiety  Physical Problem type Loss of appetitie  Referral to support programs Yes   Met with patient in Breast Multidisciplinary Clinic to introduce Support Center team/resources, reviewing distress screen per protocol.  The patient scored a 5 on the Psychosocial Distress Thermometer which indicates moderate distress. Also assessed for distress and other psychosocial needs.  Pt attended clinic with four other female family members. Pt shared that she felt better after receiving information and answers to her questions, though still slightly overwhelmed. Pt said that she has a strong support network at home.   Barrie Johnson, Counseling Intern  Supervisor, Lisa Lundeen, Chaplain  

## 2016-11-20 ENCOUNTER — Telehealth: Payer: Self-pay | Admitting: *Deleted

## 2016-11-20 NOTE — Telephone Encounter (Signed)
  Oncology Nurse Navigator Documentation  Navigator Location: [facility] (11/20/16 0900)   )Navigator Encounter Type: Telephone;MDC Follow-up (11/20/16 0900) Telephone: Outgoing Call;Clinic/MDC Follow-up (11/20/16 0900)                                                  Time Spent with Patient: 15 (11/20/16 0900)

## 2016-11-21 ENCOUNTER — Encounter: Payer: Self-pay | Admitting: Genetic Counselor

## 2016-11-21 ENCOUNTER — Ambulatory Visit (HOSPITAL_COMMUNITY): Admission: RE | Admit: 2016-11-21 | Payer: Managed Care, Other (non HMO) | Source: Ambulatory Visit

## 2016-11-21 ENCOUNTER — Other Ambulatory Visit: Payer: Managed Care, Other (non HMO)

## 2016-11-21 ENCOUNTER — Ambulatory Visit (HOSPITAL_BASED_OUTPATIENT_CLINIC_OR_DEPARTMENT_OTHER): Payer: Managed Care, Other (non HMO) | Admitting: Genetic Counselor

## 2016-11-21 ENCOUNTER — Other Ambulatory Visit: Payer: Self-pay | Admitting: Surgery

## 2016-11-21 DIAGNOSIS — Z170 Estrogen receptor positive status [ER+]: Secondary | ICD-10-CM

## 2016-11-21 DIAGNOSIS — Z808 Family history of malignant neoplasm of other organs or systems: Secondary | ICD-10-CM | POA: Diagnosis not present

## 2016-11-21 DIAGNOSIS — C50212 Malignant neoplasm of upper-inner quadrant of left female breast: Secondary | ICD-10-CM

## 2016-11-21 DIAGNOSIS — D0512 Intraductal carcinoma in situ of left breast: Secondary | ICD-10-CM

## 2016-11-21 DIAGNOSIS — Z800 Family history of malignant neoplasm of digestive organs: Secondary | ICD-10-CM

## 2016-11-21 NOTE — Progress Notes (Signed)
REFERRING PROVIDER: [name], PA [place], [place] [address]   Nicholas Lose, MD  [name], MD  PRIMARY PROVIDER:  [name], [place]  PRIMARY REASON FOR VISIT:  1. Family history of melanoma   2. Family history of stomach cancer      HISTORY OF PRESENT ILLNESS:   Donna Matthews, a 40 y.o. female, was seen for a [place] cancer genetics consultation at the request of Dr. Lindi Adie due to a personal and family history of cancer.  Donna Matthews presents to clinic today to discuss the possibility of a hereditary predisposition to cancer, genetic testing, and to further clarify her future cancer risks, as well as potential cancer risks for family members.   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 This will be treated with lumpectomy and radiation, depending on the genetic testing results.Marland Kitchen    CANCER HISTORY:    Malignant neoplasm of upper-inner quadrant of left female breast ([place])   11/07/2016 Initial Diagnosis    Left breast biopsy UIQ: DCIS with necrosis and calcifications grade 2-3, suspicion for microscopic invasion, ER 95%, PR 95%, Tis N0 stage 0; screening tomo detected left breast calcifications 1.2 cm        HORMONAL RISK FACTORS:  Menarche was at age 35.  First live birth at age N/A.  OCP use for approximately 5-6 years.  Ovaries intact: yes.  Hysterectomy: no.  Menopausal status: premenopausal.  HRT use: 0 years. Colonoscopy: no; not examined. Mammogram within the last year: yes. Number of breast biopsies: 1. Up to date with pelvic exams:  yes. Any excessive radiation exposure in the past:  no  Past Medical History:  Diagnosis Date  . Anemia   . Ectopic pregnancy   . Family history of melanoma   . Family history of stomach cancer   . Reflux   . STD (sexually transmitted disease)    Chlamydia    Past Surgical History:  Procedure Laterality Date  . DILATION AND CURETTAGE OF UTERUS       Social History   Social History  . Marital status: Single    Spouse name: N/A  . Number of children: N/A  . Years of education: N/A   Social History Main Topics  . Smoking status: Never Smoker  . Smokeless tobacco: Never Used  . Alcohol use No  . Drug use: No  . Sexual activity: Yes    Birth control/ protection: None   Other Topics Concern  . None   Social History Narrative  . None     FAMILY HISTORY:  We obtained a detailed, 4-generation family history.  Significant diagnoses are listed below: Family History  Problem Relation Age of Onset  . Diabetes Mother   . Hypertension Mother   . Heart disease Father   . Diabetes Sister   . Hypertension Sister   . Lung cancer Maternal Aunt   . Melanoma Maternal Uncle   . Lung cancer Maternal Uncle   . Stomach cancer Other     paternal great aunt  . Stomach cancer Cousin     father's maternal cousin  . Cancer Other     2 maternal great aunts with unknown cancer    The patient does not have any children, and is the only child to both her parents.  She has two paternal half sisters who are cancer free and she has three maternal half sisters  and one maternal half brother who are cancer free.  The patient's father died at 49 from a heart attack.  Her mother is alive and cancer free. She has four brothers and two sisters.  One brother died of lung cancer, one died of melanoma and one sister was recently diagnosed with lung cancer.  The patient's maternal grandparents died of conditions unrelated to cancer.  The grandmother had two sisters that had an unknown cancer.  The patient's father was an only child to his parents.  He had two paternal brothers and a paternal sister who are cancer free.  The patient's grandmother had a sister who had stomach cancer as did this sister's daughter.There is no other reported family history of cancer.   Donna Matthews is unaware of previous family history of genetic testing for hereditary cancer  risks. Patient's maternal ancestors are of African American descent, and paternal ancestors are of African American descent. There is no reported Ashkenazi Jewish ancestry. There is no known consanguinity.  GENETIC COUNSELING ASSESSMENT: Donna Matthews is a 40 y.o. female with a personal history of breast cancer and family history of melanoma and stomach cancer which is somewhat suggestive of a hereditary cancer syndrome and predisposition to cancer. We, therefore, discussed and recommended the following at today's visit.   DISCUSSION: We discussed that about 5-10% of breast cancer is hereditary with most cases due to BRCA mutations.  Other genes outside of BRCA mutations that are associated with breast cancer include ATM, CHEK2 and PALB2.  We reviewed the characteristics, features and inheritance patterns of hereditary cancer syndromes. We also discussed genetic testing, including the appropriate family members to test, the process of testing, insurance coverage and turn-around-time for results. We discussed the implications of a negative, positive and/or variant of uncertain significant result. We recommended Donna Matthews pursue genetic testing for the Common hereditary gene panel. The Hereditary Gene Panel offered by [facility] includes sequencing and/or deletion duplication testing of the following 42 genes: APC, ATM, AXIN2, BARD1, BMPR1A, BRCA1, BRCA2, BRIP1, CDH1, CDKN2A, CHEK2, DICER1, EPCAM, GREM1, KIT, MEN1, MLH1, MSH2, MSH6, MUTYH, NBN, NF1, PALB2, PDGFRA, PMS2, POLD1, POLE, PTEN, RAD50, RAD51C, RAD51D, SDHA, SDHB, SDHC, SDHD, SMAD4, SMARCA4. STK11, TP53, TSC1, TSC2, and VHL.     Based on Donna Matthews personal and family history of cancer, she meets medical criteria for genetic testing. Despite that she meets criteria, she may still have an out of pocket cost. We discussed that if her out of pocket cost for testing is over $100, the laboratory will call and confirm whether she wants to proceed with testing.   If the out of pocket cost of testing is less than $100 she will be billed by the genetic testing laboratory.   PLAN: After considering the risks, benefits, and limitations, Donna Matthews  provided informed consent to pursue genetic testing and the blood sample was sent to [facility] for analysis of the Common Hereditary gene panel. Results should be available within approximately 2-3 weeks' time, at which point they will be disclosed by telephone to Donna Matthews, as will any additional recommendations warranted by these results. Donna Matthews will receive a summary of her genetic counseling visit and a copy of her results once available. This information will also be available in Epic. We encouraged Donna Matthews to remain in contact with cancer genetics annually so that we can continuously update the family history and inform her of any changes in cancer genetics and testing that may be of benefit for  her family. Donna Matthews questions were answered to her satisfaction today. Our contact information was provided should additional questions or concerns arise.  Lastly, we encouraged Donna Matthews to remain in contact with cancer genetics annually so that we can continuously update the family history and inform her of any changes in cancer genetics and testing that may be of benefit for this family.   Ms.  Matthews questions were answered to her satisfaction today. Our contact information was provided should additional questions or concerns arise. Thank you for the referral and allowing [place] to share in the care of your patient.   Orvill Coulthard P. Florene Glen, [place], [facility] Certified Genetic Counselor [email or web address] phone: [phone]  The patient was seen for a total of 35 minutes in face-to-face genetic counseling.  This patient was discussed with Drs. Magrinat, Lindi Adie and/or Burr Medico who agrees with the above.    _______________________________________________________________________ For Office Staff:  Number of people  involved in session: 2 Was an Intern/ student involved with case: no

## 2016-11-22 ENCOUNTER — Encounter: Payer: Self-pay | Admitting: Radiation Oncology

## 2016-11-23 ENCOUNTER — Encounter: Payer: Self-pay | Admitting: Radiation Oncology

## 2016-11-23 ENCOUNTER — Inpatient Hospital Stay: Admission: RE | Admit: 2016-11-23 | Payer: Managed Care, Other (non HMO) | Source: Ambulatory Visit

## 2016-11-26 ENCOUNTER — Ambulatory Visit (HOSPITAL_COMMUNITY): Admission: RE | Admit: 2016-11-26 | Payer: Managed Care, Other (non HMO) | Source: Ambulatory Visit | Admitting: Surgery

## 2016-12-04 ENCOUNTER — Ambulatory Visit (HOSPITAL_COMMUNITY)
Admission: RE | Admit: 2016-12-04 | Discharge: 2016-12-04 | Disposition: A | Discharge status: Home / Self Care | Payer: Managed Care, Other (non HMO) | Source: Ambulatory Visit | Attending: Surgery | Admitting: Surgery

## 2016-12-04 DIAGNOSIS — Z170 Estrogen receptor positive status [ER+]: Secondary | ICD-10-CM | POA: Diagnosis present

## 2016-12-04 DIAGNOSIS — C50212 Malignant neoplasm of upper-inner quadrant of left female breast: Secondary | ICD-10-CM | POA: Diagnosis present

## 2016-12-04 MED ORDER — GADOBENATE DIMEGLUMINE 529 MG/ML IV SOLN
15.0000 mL | Freq: Once | INTRAVENOUS | Status: AC | PRN
  Administered 2016-12-04: 15 mL via INTRAVENOUS

## 2016-12-05 ENCOUNTER — Encounter: Payer: Self-pay | Admitting: Genetic Counselor

## 2016-12-05 ENCOUNTER — Telehealth: Payer: Self-pay | Admitting: Genetic Counselor

## 2016-12-05 DIAGNOSIS — Z1379 Encounter for other screening for genetic and chromosomal anomalies: Secondary | ICD-10-CM | POA: Insufficient documentation

## 2016-12-05 NOTE — Telephone Encounter (Signed)
Revealed negative genetic testing on the common hereditary cancer panel.  I released a copy of the results to her via the portal and will let her care team know.

## 2016-12-12 ENCOUNTER — Ambulatory Visit: Payer: Managed Care, Other (non HMO)

## 2016-12-12 ENCOUNTER — Ambulatory Visit: Payer: Self-pay | Admitting: Genetic Counselor

## 2016-12-12 ENCOUNTER — Ambulatory Visit: Payer: Managed Care, Other (non HMO) | Admitting: Radiation Oncology

## 2016-12-12 DIAGNOSIS — Z808 Family history of malignant neoplasm of other organs or systems: Secondary | ICD-10-CM

## 2016-12-12 DIAGNOSIS — Z170 Estrogen receptor positive status [ER+]: Secondary | ICD-10-CM

## 2016-12-12 DIAGNOSIS — Z800 Family history of malignant neoplasm of digestive organs: Secondary | ICD-10-CM

## 2016-12-12 DIAGNOSIS — Z1379 Encounter for other screening for genetic and chromosomal anomalies: Secondary | ICD-10-CM

## 2016-12-12 DIAGNOSIS — C50212 Malignant neoplasm of upper-inner quadrant of left female breast: Secondary | ICD-10-CM

## 2016-12-12 NOTE — Progress Notes (Signed)
HPI: Ms. Botello was previously seen in the [place] clinic due to a personal and family history of cancer and concerns regarding a hereditary predisposition to cancer. Please refer to our prior cancer genetics clinic note for more information regarding Ms. Clapper's medical, social and family histories, and our assessment and recommendations, at the time. Ms. Oscar recent genetic test results were disclosed to her, as were recommendations warranted by these results. These results and recommendations are discussed in more detail below.  CANCER HISTORY:    Malignant neoplasm of upper-inner quadrant of left female breast ([place])   11/07/2016 Initial Diagnosis    Left breast biopsy UIQ: DCIS with necrosis and calcifications grade 2-3, suspicion for microscopic invasion, ER 95%, PR 95%, Tis N0 stage 0; screening tomo detected left breast calcifications 1.2 cm      12/02/2016 Genetic Testing    Negative genetic testing on the Common HEreditary cancer panel.  The Hereditary Gene Panel offered by [facility] includes sequencing and/or deletion duplication testing of the following 43 genes: APC, ATM, AXIN2, BARD1, BMPR1A, BRCA1, BRCA2, BRIP1, CDH1, CDKN2A (p14ARF), CDKN2A (p16INK4a), CHEK2, DICER1, EPCAM (Deletion/duplication testing only), GREM1 (promoter region deletion/duplication testing only), KIT, MEN1, MLH1, MSH2, MSH6, MUTYH, NBN, NF1, PALB2, PDGFRA, PMS2, POLD1, POLE, PTEN, RAD50, RAD51C, RAD51D, SDHB, SDHC, SDHD, SMAD4, SMARCA4. STK11, TP53, TSC1, TSC2, and VHL.  The following gene was evaluated for sequence changes only: SDHA and HOXB13 c.251G>A.  The report date is December 02, 2016.       FAMILY HISTORY:  We obtained a detailed, 4-generation family history.  Significant diagnoses are listed below: Family History  Problem Relation Age of Onset  . Diabetes Mother   . Hypertension Mother   . Heart disease Father   . Diabetes Sister   . Hypertension Sister   . Lung cancer  Maternal Aunt   . Melanoma Maternal Uncle   . Lung cancer Maternal Uncle   . Stomach cancer Other     paternal great aunt  . Stomach cancer Cousin     father's maternal cousin  . Cancer Other     2 maternal great aunts with unknown cancer    The patient does not have any children, and is the only child to both her parents.  She has two paternal half sisters who are cancer free and she has three maternal half sisters and one maternal half brother who are cancer free.  The patient's father died at 90 from a heart attack.  Her mother is alive and cancer free. She has four brothers and two sisters.  One brother died of lung cancer, one died of melanoma and one sister was recently diagnosed with lung cancer.  The patient's maternal grandparents died of conditions unrelated to cancer.  The grandmother had two sisters that had an unknown cancer.  The patient's father was an only child to his parents.  He had two paternal brothers and a paternal sister who are cancer free.  The patient's grandmother had a sister who had stomach cancer as did this sister's daughter.There is no other reported family history of cancer.   Ms. Cookson is unaware of previous family history of genetic testing for hereditary cancer risks. Patient's maternal ancestors are of African American descent, and paternal ancestors are of African American descent. There is no reported Ashkenazi Jewish ancestry. There is no known consanguinity.  GENETIC TEST RESULTS: Genetic testing reported out on December 02, 2016 through the Common Hereditary cancer panel found no deleterious mutations.  The Hereditary Gene Panel offered by [facility] includes sequencing and/or deletion duplication testing of the following 43 genes: APC, ATM, AXIN2, BARD1, BMPR1A, BRCA1, BRCA2, BRIP1, CDH1, CDKN2A (p14ARF), CDKN2A (p16INK4a), CHEK2, DICER1, EPCAM (Deletion/duplication testing only), GREM1 (promoter region deletion/duplication testing only), KIT, MEN1, MLH1,  MSH2, MSH6, MUTYH, NBN, NF1, PALB2, PDGFRA, PMS2, POLD1, POLE, PTEN, RAD50, RAD51C, RAD51D, SDHB, SDHC, SDHD, SMAD4, SMARCA4. STK11, TP53, TSC1, TSC2, and VHL.  The following gene was evaluated for sequence changes only: SDHA and HOXB13 c.251G>A variant only.  The test report has been scanned into EPIC and is located under the Molecular Pathology section of the Results Review tab.   We discussed with Ms. Sipe that since the current genetic testing is not perfect, it is possible there may be a gene mutation in one of these genes that current testing cannot detect, but that chance is small. We also discussed, that it is possible that another gene that has not yet been discovered, or that we have not yet tested, is responsible for the cancer diagnoses in the family, and it is, therefore, important to remain in touch with cancer genetics in the future so that we can continue to offer Ms. Riesen the most up to date genetic testing.   CANCER SCREENING RECOMMENDATIONS: This result is reassuring and indicates that Ms. Brierley likely does not have an increased risk for a future cancer due to a mutation in one of these genes. This normal test also suggests that Ms. Tinkle's cancer was most likely not due to an inherited predisposition associated with one of these genes.  Most cancers happen by chance and this negative test suggests that her cancer falls into this category.  We, therefore, recommended she continue to follow the cancer management and screening guidelines provided by her oncology and primary healthcare provider.   RECOMMENDATIONS FOR FAMILY MEMBERS: Women in this family might be at some increased risk of developing cancer, over the general population risk, simply due to the family history of cancer. We recommended women in this family have a yearly mammogram beginning at age 69, or 50 years younger than the earliest onset of cancer, an annual clinical breast exam, and perform monthly breast self-exams.  Women in this family should also have a gynecological exam as recommended by their primary provider. All family members should have a colonoscopy by age 69.  FOLLOW-UP: Lastly, we discussed with Ms. Cortina that cancer genetics is a rapidly advancing field and it is possible that new genetic tests will be appropriate for her and/or her family members in the future. We encouraged her to remain in contact with cancer genetics on an annual basis so we can update her personal and family histories and let her know of advances in cancer genetics that may benefit this family.   Our contact number was provided. Ms. Graybill questions were answered to her satisfaction, and she knows she is welcome to call us at anytime with additional questions or concerns.   Roma Kayser, MS, [facility] Certified Genetic Counselor [email or web address]

## 2016-12-13 ENCOUNTER — Encounter (HOSPITAL_COMMUNITY)
Admission: RE | Admit: 2016-12-13 | Discharge: 2016-12-13 | Disposition: A | Discharge status: Home / Self Care | Payer: Managed Care, Other (non HMO) | Source: Ambulatory Visit | Attending: Surgery | Admitting: Surgery

## 2016-12-13 ENCOUNTER — Encounter (HOSPITAL_COMMUNITY): Payer: Self-pay

## 2016-12-13 DIAGNOSIS — Z01812 Encounter for preprocedural laboratory examination: Secondary | ICD-10-CM | POA: Diagnosis present

## 2016-12-13 DIAGNOSIS — D0512 Intraductal carcinoma in situ of left breast: Secondary | ICD-10-CM

## 2016-12-13 HISTORY — DX: Gastro-esophageal reflux disease without esophagitis: K21.9

## 2016-12-13 HISTORY — DX: Benign neoplasm of connective and other soft tissue, unspecified: D21.9

## 2016-12-13 LAB — CBC
HEMATOCRIT: 27.7 % — AB (ref 36.0–46.0)
HEMOGLOBIN: 8.7 g/dL — AB (ref 12.0–15.0)
MCH: 24 pg — ABNORMAL LOW (ref 26.0–34.0)
MCHC: 31.4 g/dL (ref 30.0–36.0)
MCV: 76.5 fL — ABNORMAL LOW (ref 78.0–100.0)
Platelets: 303 10*3/uL (ref 150–400)
RBC: 3.62 MIL/uL — AB (ref 3.87–5.11)
RDW: 21.4 % — ABNORMAL HIGH (ref 11.5–15.5)
WBC: 5.6 10*3/uL (ref 4.0–10.5)

## 2016-12-13 LAB — HCG, SERUM, QUALITATIVE: PREG SERUM: NEGATIVE

## 2016-12-13 NOTE — Pre-Procedure Instructions (Signed)
    Donna Matthews  12/13/2016      [id number] [name], [place] [address] PhoneSE:[phone] FaxXO:[phone]    Your procedure is scheduled on Tues., Jan 9  Report to [facility] Admitting at 11:15 A.M.  Call this number if you have problems the morning of surgery:  [phone]   Remember:  Do not eat food or drink liquids after midnight on Mon. Jan 8   Take these medicines the morning of surgery with A SIP OF WATER : tylenol if needed              Stop aspirin, advil, motrin, ibuprofen, aleve,BCPowders, Goody's, vitamins/herbal medicines   Do not wear jewelry, make-up or nail polish.  Do not wear lotions, powders, or perfumes, or deoderant.  Do not shave 48 hours prior to surgery.  Men may shave face and neck.  Do not bring valuables to the hospital.  [facility] is not responsible for any belongings or valuables.  Contacts, dentures or bridgework may not be worn into surgery.  Leave your suitcase in the car.  After surgery it may be brought to your room.  For patients admitted to the hospital, discharge time will be determined by your treatment team.  Patients discharged the day of surgery will not be allowed to drive home.   Name and phone number of your driver:    Special instructions:  Review preparing for surgery  Please read over the following fact sheets that you were given. Coughing and Deep Breathing

## 2016-12-13 NOTE — Progress Notes (Signed)
PCP: Dr. Marilynne Drivers @ [place]

## 2016-12-14 NOTE — Progress Notes (Addendum)
Anesthesia Chart Review:  Pt is a 41 year old female scheduled for L breast lumpectomy with radioactive seed and sentinel lymph node biopsy on 12/18/2016 with [name], MD  - PCP is Marilynne Drivers, PA  PMH includes:  Fibroids, anemia, GERD. Never smoker. BMI 29  BP 119/72   Pulse 76   Temp 36.9 C (Oral)   Resp 18   Ht 5\' 4"  (1.626 m)   Wt 171 lb 2 oz (77.6 kg)   LMP 12/07/2016   SpO2 100%   BMI 29.37 kg/m    Medications include: iron  Preoperative labs reviewed.  H/H 8.7/24.7. This is consistent with prior results in Epic. Notes from PCP indicate pt has iron deficiency anemia and has received iron treatments in the past.  Per note 10/12/16 (on paper chart), pt also on oral iron. Anemia due to fibroids. Last CBC at PCP's office showed hgb was 11.3 on 10/12/16. Dr. Dalbert Batman is aware of PAT lab results. Defer decision to get T&S or not to Dr. Dalbert Batman.   If no changes, I anticipate pt can proceed with surgery as scheduled.   Willeen Cass, FNP-BC [facility] Short Stay Surgical Center/Anesthesiology Phone: [phone] 12/17/2016 2:20 PM

## 2016-12-17 ENCOUNTER — Ambulatory Visit
Admission: RE | Admit: 2016-12-17 | Discharge: 2016-12-17 | Disposition: A | Discharge status: Home / Self Care | Payer: Managed Care, Other (non HMO) | Source: Ambulatory Visit | Attending: Surgery | Admitting: Surgery

## 2016-12-17 DIAGNOSIS — D0512 Intraductal carcinoma in situ of left breast: Secondary | ICD-10-CM

## 2016-12-18 ENCOUNTER — Ambulatory Visit (HOSPITAL_COMMUNITY)
Admission: RE | Admit: 2016-12-18 | Discharge: 2016-12-18 | Disposition: A | Discharge status: Home / Self Care | Payer: Managed Care, Other (non HMO) | Source: Ambulatory Visit | Attending: Surgery | Admitting: Surgery

## 2016-12-18 ENCOUNTER — Encounter (HOSPITAL_COMMUNITY): Payer: Self-pay | Admitting: *Deleted

## 2016-12-18 ENCOUNTER — Ambulatory Visit
Admission: RE | Admit: 2016-12-18 | Discharge: 2016-12-18 | Disposition: A | Discharge status: Home / Self Care | Payer: Managed Care, Other (non HMO) | Source: Ambulatory Visit | Attending: Surgery | Admitting: Surgery

## 2016-12-18 ENCOUNTER — Ambulatory Visit (HOSPITAL_COMMUNITY): Payer: Managed Care, Other (non HMO) | Admitting: Emergency Medicine

## 2016-12-18 ENCOUNTER — Encounter (HOSPITAL_COMMUNITY)
Admission: RE | Disposition: A | Discharge status: Home / Self Care | Payer: Self-pay | Source: Ambulatory Visit | Attending: Surgery

## 2016-12-18 ENCOUNTER — Ambulatory Visit (HOSPITAL_COMMUNITY): Payer: Managed Care, Other (non HMO) | Admitting: Certified Registered"

## 2016-12-18 DIAGNOSIS — C50912 Malignant neoplasm of unspecified site of left female breast: Secondary | ICD-10-CM | POA: Diagnosis present

## 2016-12-18 DIAGNOSIS — D0512 Intraductal carcinoma in situ of left breast: Secondary | ICD-10-CM

## 2016-12-18 HISTORY — PX: BREAST LUMPECTOMY WITH RADIOACTIVE SEED AND SENTINEL LYMPH NODE BIOPSY: SHX6550

## 2016-12-18 SURGERY — BREAST LUMPECTOMY WITH RADIOACTIVE SEED AND SENTINEL LYMPH NODE BIOPSY
Anesthesia: General | Site: Breast | Laterality: Left

## 2016-12-18 MED ORDER — MIDAZOLAM HCL 2 MG/2ML IJ SOLN
INTRAMUSCULAR | Status: AC
  Filled 2016-12-18: qty 2

## 2016-12-18 MED ORDER — LACTATED RINGERS IV SOLN
INTRAVENOUS | Status: DC
  Administered 2016-12-18: 12:00:00 via INTRAVENOUS

## 2016-12-18 MED ORDER — FENTANYL CITRATE (PF) 100 MCG/2ML IJ SOLN
INTRAMUSCULAR | Status: DC | PRN
  Administered 2016-12-18 (×3): 25 ug via INTRAVENOUS
  Administered 2016-12-18 (×2): 50 ug via INTRAVENOUS
  Administered 2016-12-18: 25 ug via INTRAVENOUS

## 2016-12-18 MED ORDER — HEMOSTATIC AGENTS (NO CHARGE) OPTIME
TOPICAL | Status: DC | PRN
  Administered 2016-12-18: 1 via TOPICAL

## 2016-12-18 MED ORDER — PHENYLEPHRINE 40 MCG/ML (10ML) SYRINGE FOR IV PUSH (FOR BLOOD PRESSURE SUPPORT)
PREFILLED_SYRINGE | INTRAVENOUS | Status: AC
Start: 2016-12-18 — End: 2016-12-18
  Filled 2016-12-18: qty 10

## 2016-12-18 MED ORDER — CHLORHEXIDINE GLUCONATE CLOTH 2 % EX PADS
6.0000 | MEDICATED_PAD | Freq: Once | CUTANEOUS | Status: DC
Start: 2016-12-18 — End: 2016-12-18

## 2016-12-18 MED ORDER — FENTANYL CITRATE (PF) 100 MCG/2ML IJ SOLN
INTRAMUSCULAR | Status: AC
Start: 2016-12-18 — End: 2016-12-18
  Administered 2016-12-18: 100 ug via INTRAVENOUS
  Filled 2016-12-18: qty 2

## 2016-12-18 MED ORDER — MIDAZOLAM HCL 2 MG/2ML IJ SOLN
INTRAMUSCULAR | Status: AC
  Administered 2016-12-18: 2 mg via INTRAVENOUS
  Filled 2016-12-18: qty 2

## 2016-12-18 MED ORDER — OXYCODONE-ACETAMINOPHEN 5-325 MG PO TABS: 1.0000 | ORAL_TABLET | ORAL | 0 refills | Status: DC | PRN

## 2016-12-18 MED ORDER — PROPOFOL 10 MG/ML IV BOLUS
INTRAVENOUS | Status: DC | PRN
  Administered 2016-12-18: 170 mg via INTRAVENOUS

## 2016-12-18 MED ORDER — LIDOCAINE 2% (20 MG/ML) 5 ML SYRINGE
INTRAMUSCULAR | Status: DC | PRN
  Administered 2016-12-18: 100 mg via INTRAVENOUS

## 2016-12-18 MED ORDER — CHLORHEXIDINE GLUCONATE CLOTH 2 % EX PADS: 6.0000 | MEDICATED_PAD | Freq: Once | CUTANEOUS | Status: DC

## 2016-12-18 MED ORDER — TECHNETIUM TC 99M SULFUR COLLOID FILTERED
1.0000 | Freq: Once | INTRAVENOUS | Status: AC | PRN
  Administered 2016-12-18: 1 via INTRADERMAL

## 2016-12-18 MED ORDER — MIDAZOLAM HCL 5 MG/5ML IJ SOLN
INTRAMUSCULAR | Status: DC | PRN
  Administered 2016-12-18 (×2): 1 mg via INTRAVENOUS

## 2016-12-18 MED ORDER — BUPIVACAINE-EPINEPHRINE (PF) 0.5% -1:200000 IJ SOLN
INTRAMUSCULAR | Status: DC | PRN
  Administered 2016-12-18: 30 mL via PERINEURAL

## 2016-12-18 MED ORDER — PROPOFOL 10 MG/ML IV BOLUS
INTRAVENOUS | Status: AC
Start: 2016-12-18 — End: 2016-12-18
  Filled 2016-12-18: qty 20

## 2016-12-18 MED ORDER — ONDANSETRON HCL 4 MG/2ML IJ SOLN
4.0000 mg | Freq: Once | INTRAMUSCULAR | Status: AC
  Administered 2016-12-18: 4 mg via INTRAVENOUS

## 2016-12-18 MED ORDER — CLINDAMYCIN PHOSPHATE 600 MG/50ML IV SOLN
600.0000 mg | Freq: Once | INTRAVENOUS | Status: AC
  Administered 2016-12-18: 600 mg via INTRAVENOUS
  Filled 2016-12-18: qty 50

## 2016-12-18 MED ORDER — ONDANSETRON HCL 4 MG/2ML IJ SOLN
INTRAMUSCULAR | Status: DC | PRN
  Administered 2016-12-18: 4 mg via INTRAVENOUS

## 2016-12-18 MED ORDER — MIDAZOLAM HCL 2 MG/2ML IJ SOLN
2.0000 mg | Freq: Once | INTRAMUSCULAR | Status: AC
  Administered 2016-12-18: 2 mg via INTRAVENOUS

## 2016-12-18 MED ORDER — DEXAMETHASONE SODIUM PHOSPHATE 10 MG/ML IJ SOLN
INTRAMUSCULAR | Status: DC | PRN
  Administered 2016-12-18: 10 mg via INTRAVENOUS

## 2016-12-18 MED ORDER — FENTANYL CITRATE (PF) 100 MCG/2ML IJ SOLN
100.0000 ug | Freq: Once | INTRAMUSCULAR | Status: AC
  Administered 2016-12-18: 100 ug via INTRAVENOUS

## 2016-12-18 MED ORDER — ONDANSETRON HCL 4 MG/2ML IJ SOLN
INTRAMUSCULAR | Status: AC
  Filled 2016-12-18: qty 2

## 2016-12-18 MED ORDER — 0.9 % SODIUM CHLORIDE (POUR BTL) OPTIME
TOPICAL | Status: DC | PRN
  Administered 2016-12-18: 1000 mL

## 2016-12-18 MED ORDER — BUPIVACAINE HCL (PF) 0.25 % IJ SOLN
INTRAMUSCULAR | Status: AC
  Filled 2016-12-18: qty 30

## 2016-12-18 MED ORDER — BUPIVACAINE HCL 0.25 % IJ SOLN
INTRAMUSCULAR | Status: DC | PRN
  Administered 2016-12-18: 14 mL

## 2016-12-18 MED ORDER — FENTANYL CITRATE (PF) 100 MCG/2ML IJ SOLN
INTRAMUSCULAR | Status: AC
  Filled 2016-12-18: qty 4

## 2016-12-18 SURGICAL SUPPLY — 51 items
ADH SKN CLS APL DERMABOND .7 (GAUZE/BANDAGES/DRESSINGS) ×1
APPLIER CLIP 9.375 MED OPEN (MISCELLANEOUS) ×2
APR CLP MED 9.3 20 MLT OPN (MISCELLANEOUS) ×1
BINDER BREAST LRG (GAUZE/BANDAGES/DRESSINGS) IMPLANT
BINDER BREAST XLRG (GAUZE/BANDAGES/DRESSINGS) ×1 IMPLANT
BLADE SURG 15 STRL LF DISP TIS (BLADE) ×1 IMPLANT
BLADE SURG 15 STRL SS (BLADE) ×4
CANISTER SUCTION 2500CC (MISCELLANEOUS) ×2 IMPLANT
CHLORAPREP W/TINT 26ML (MISCELLANEOUS) ×2 IMPLANT
CLIP APPLIE 9.375 MED OPEN (MISCELLANEOUS) ×1 IMPLANT
CONT SPEC 4OZ CLIKSEAL STRL BL (MISCELLANEOUS) ×2 IMPLANT
COVER PROBE W GEL 5X96 (DRAPES) ×2 IMPLANT
COVER SURGICAL LIGHT HANDLE (MISCELLANEOUS) ×2 IMPLANT
DERMABOND ADVANCED (GAUZE/BANDAGES/DRESSINGS) ×1
DERMABOND ADVANCED .7 DNX12 (GAUZE/BANDAGES/DRESSINGS) ×1 IMPLANT
DEVICE DUBIN SPECIMEN MAMMOGRA (MISCELLANEOUS) ×2 IMPLANT
DRAPE CHEST BREAST 15X10 FENES (DRAPES) ×2 IMPLANT
DRAPE UTILITY XL STRL (DRAPES) ×3 IMPLANT
ELECT CAUTERY BLADE 6.4 (BLADE) ×3 IMPLANT
ELECT REM PT RETURN 9FT ADLT (ELECTROSURGICAL) ×2
ELECTRODE REM PT RTRN 9FT ADLT (ELECTROSURGICAL) ×1 IMPLANT
GLOVE BIO SURGEON STRL SZ8 (GLOVE) ×2 IMPLANT
GLOVE BIOGEL PI IND STRL 8 (GLOVE) ×1 IMPLANT
GLOVE BIOGEL PI INDICATOR 8 (GLOVE) ×2
GOWN STRL REUS W/ TWL LRG LVL3 (GOWN DISPOSABLE) ×1 IMPLANT
GOWN STRL REUS W/ TWL XL LVL3 (GOWN DISPOSABLE) ×1 IMPLANT
GOWN STRL REUS W/TWL LRG LVL3 (GOWN DISPOSABLE) ×2
GOWN STRL REUS W/TWL XL LVL3 (GOWN DISPOSABLE) ×2
HEMOSTAT SNOW SURGICEL 2X4 (HEMOSTASIS) ×1 IMPLANT
KIT BASIN OR (CUSTOM PROCEDURE TRAY) ×2 IMPLANT
KIT MARKER MARGIN INK (KITS) ×2 IMPLANT
LIGHT WAVEGUIDE WIDE FLAT (MISCELLANEOUS) ×2 IMPLANT
NDL 18GX1X1/2 (RX/OR ONLY) (NEEDLE) IMPLANT
NDL FILTER BLUNT 18X1 1/2 (NEEDLE) IMPLANT
NDL HYPO 25X1 1.5 SAFETY (NEEDLE) ×1 IMPLANT
NEEDLE 18GX1X1/2 (RX/OR ONLY) (NEEDLE) IMPLANT
NEEDLE FILTER BLUNT 18X 1/2SAF (NEEDLE)
NEEDLE FILTER BLUNT 18X1 1/2 (NEEDLE) IMPLANT
NEEDLE HYPO 25X1 1.5 SAFETY (NEEDLE) ×2 IMPLANT
NS IRRIG 1000ML POUR BTL (IV SOLUTION) ×2 IMPLANT
PACK SURGICAL SETUP 50X90 (CUSTOM PROCEDURE TRAY) ×2 IMPLANT
PENCIL BUTTON HOLSTER BLD 10FT (ELECTRODE) ×2 IMPLANT
SPONGE LAP 18X18 X RAY DECT (DISPOSABLE) ×2 IMPLANT
SUT MNCRL AB 4-0 PS2 18 (SUTURE) ×3 IMPLANT
SUT VIC AB 3-0 SH 18 (SUTURE) ×3 IMPLANT
SYR BULB 3OZ (MISCELLANEOUS) ×2 IMPLANT
SYR CONTROL 10ML LL (SYRINGE) ×2 IMPLANT
TOWEL OR 17X24 6PK STRL BLUE (TOWEL DISPOSABLE) ×2 IMPLANT
TOWEL OR 17X26 10 PK STRL BLUE (TOWEL DISPOSABLE) ×2 IMPLANT
TUBE CONNECTING 12X1/4 (SUCTIONS) ×2 IMPLANT
YANKAUER SUCT BULB TIP NO VENT (SUCTIONS) ×2 IMPLANT

## 2016-12-18 NOTE — Anesthesia Preprocedure Evaluation (Addendum)
Anesthesia Evaluation  Patient identified by MRN, date of birth, ID band Patient awake    Reviewed: Allergy & Precautions, NPO status , Patient's Chart, lab work & pertinent test results  Airway Mallampati: II  TM Distance: >3 FB Neck ROM: Full    Dental  (+) Teeth Intact, Dental Advisory Given   Pulmonary neg pulmonary ROS,    Pulmonary exam normal breath sounds clear to auscultation       Cardiovascular Exercise Tolerance: Good negative cardio ROS Normal cardiovascular exam Rhythm:Regular Rate:Normal     Neuro/Psych negative neurological ROS     GI/Hepatic Neg liver ROS, GERD  ,  Endo/Other  negative endocrine ROS  Renal/GU negative Renal ROS     Musculoskeletal negative musculoskeletal ROS (+)   Abdominal   Peds  Hematology  (+) Blood dyscrasia, anemia ,   Anesthesia Other Findings Day of surgery medications reviewed with the patient.  Left breast cancer  Reproductive/Obstetrics Fibroids                              Anesthesia Physical Anesthesia Plan  ASA: II  Anesthesia Plan: General   Post-op Pain Management: GA combined w/ Regional for post-op pain   Induction: Intravenous  Airway Management Planned: LMA  Additional Equipment:   Intra-op Plan:   Post-operative Plan: Extubation in OR  Informed Consent: I have reviewed the patients History and Physical, chart, labs and discussed the procedure including the risks, benefits and alternatives for the proposed anesthesia with the patient or authorized representative who has indicated his/her understanding and acceptance.   Dental advisory given  Plan Discussed with: CRNA  Anesthesia Plan Comments: (Risks/benefits of general anesthesia discussed with patient including risk of damage to teeth, lips, gum, and tongue, nausea/vomiting, allergic reactions to medications, and the possibility of heart attack, stroke and death.   All patient questions answered.  Patient wishes to proceed.)        Anesthesia Quick Evaluation

## 2016-12-18 NOTE — Anesthesia Procedure Notes (Signed)
Procedure Name: LMA Insertion Date/Time: 12/18/2016 1:17 PM Performed by: Merrilyn Puma B Pre-anesthesia Checklist: Patient identified, Emergency Drugs available, Suction available, Patient being monitored and Timeout performed Patient Re-evaluated:Patient Re-evaluated prior to inductionOxygen Delivery Method: Circle system utilized Preoxygenation: Pre-oxygenation with 100% oxygen Intubation Type: IV induction Ventilation: Mask ventilation without difficulty LMA: LMA inserted LMA Size: 4.0 Number of attempts: 1 Placement Confirmation: positive ETCO2,  CO2 detector and breath sounds checked- equal and bilateral Tube secured with: Tape Dental Injury: Teeth and Oropharynx as per pre-operative assessment

## 2016-12-18 NOTE — Anesthesia Postprocedure Evaluation (Signed)
Anesthesia Post Note  Patient: Donna Matthews  Procedure(s) Performed: Procedure(s) (LRB): RADIOACTIVE SEED GUIDED LEFT BREAST LUMPECTOMY WITH LEFT AXILLARY SENTINEL LYMPH NODE BIOPSY (Left)  Patient location during evaluation: PACU Anesthesia Type: General Level of consciousness: sedated Pain management: satisfactory to patient Vital Signs Assessment: post-procedure vital signs reviewed and stable Respiratory status: spontaneous breathing Cardiovascular status: stable Anesthetic complications: no       Last Vitals:  Vitals:   12/18/16 1515 12/18/16 1531  BP: 132/76 125/78  Pulse: 79 75  Resp: 17 18  Temp: 36.7 C     Last Pain:  Vitals:   12/18/16 1531  TempSrc:   PainSc: 0-No pain                 Jozlin Bently EDWARD

## 2016-12-18 NOTE — Discharge Instructions (Signed)
[facility] °Office Phone Number [phone] ° °BREAST BIOPSY/ PARTIAL MASTECTOMY: POST OP INSTRUCTIONS ° °Always review your discharge instruction sheet given to you by the facility where your surgery was performed. ° °IF YOU HAVE DISABILITY OR FAMILY LEAVE FORMS, YOU MUST BRING THEM TO THE OFFICE FOR PROCESSING.  DO NOT GIVE THEM TO YOUR DOCTOR. ° °1. A prescription for pain medication may be given to you upon discharge.  Take your pain medication as prescribed, if needed.  If narcotic pain medicine is not needed, then you may take acetaminophen (Tylenol) or ibuprofen (Advil) as needed. °2. Take your usually prescribed medications unless otherwise directed °3. If you need a refill on your pain medication, please contact your pharmacy.  They will contact our office to request authorization.  Prescriptions will not be filled after 5pm or on week-ends. °4. You should eat very light the first 24 hours after surgery, such as soup, crackers, pudding, etc.  Resume your normal diet the day after surgery. °5. Most patients will experience some swelling and bruising in the breast.  Ice packs and a good support bra will help.  Swelling and bruising can take several days to resolve.  °6. It is common to experience some constipation if taking pain medication after surgery.  Increasing fluid intake and taking a stool softener will usually help or prevent this problem from occurring.  A mild laxative (Milk of Magnesia or Miralax) should be taken according to package directions if there are no bowel movements after 48 hours. °7. Unless discharge instructions indicate otherwise, you may remove your bandages 24-48 hours after surgery, and you may shower at that time.  You may have steri-strips (small skin tapes) in place directly over the incision.  These strips should be left on the skin for 7-10 days.  If your surgeon used skin glue on the incision, you may shower in 24 hours.  The glue will flake off over the  next 2-3 weeks.  Any sutures or staples will be removed at the office during your follow-up visit. °8. ACTIVITIES:  You may resume regular daily activities (gradually increasing) beginning the next day.  Wearing a good support bra or sports bra minimizes pain and swelling.  You may have sexual intercourse when it is comfortable. °a. You may drive when you no longer are taking prescription pain medication, you can comfortably wear a seatbelt, and you can safely maneuver your car and apply brakes. °b. RETURN TO WORK:  ______________________________________________________________________________________ °9. You should see your doctor in the office for a follow-up appointment approximately two weeks after your surgery.  Your doctor’s nurse will typically make your follow-up appointment when she calls you with your pathology report.  Expect your pathology report 2-3 business days after your surgery.  You may call to check if you do not hear from us after three days. °10. OTHER INSTRUCTIONS: _______________________________________________________________________________________________ _____________________________________________________________________________________________________________________________________ °_____________________________________________________________________________________________________________________________________ °_____________________________________________________________________________________________________________________________________ ° °WHEN TO CALL YOUR DOCTOR: °1. Fever over 101.0 °2. Nausea and/or vomiting. °3. Extreme swelling or bruising. °4. Continued bleeding from incision. °5. Increased pain, redness, or drainage from the incision. ° °The clinic staff is available to answer your questions during regular business hours.  Please don’t hesitate to call and ask to speak to one of the nurses for clinical concerns.  If you have a medical emergency, go to the nearest  emergency room or call 911.  A surgeon from [facility] is always on call at the hospital. ° °For further questions, please visit [email or web address]  °

## 2016-12-18 NOTE — Interval H&P Note (Signed)
History and Physical Interval Note:  12/18/2016 12:55 PM  Donna Matthews  has presented today for surgery, with the diagnosis of LEFT BREAST DUCTAL CARCINOMA IN SITU  The various methods of treatment have been discussed with the patient and family. After consideration of risks, benefits and other options for treatment, the patient has consented to  Procedure(s): BREAST LUMPECTOMY WITH RADIOACTIVE SEED AND SENTINEL LYMPH NODE BIOPSY (Left) as a surgical intervention .  The patient's history has been reviewed, patient examined, no change in status, stable for surgery.  I have reviewed the patient's chart and labs.  Questions were answered to the patient's satisfaction.     [name] A.

## 2016-12-18 NOTE — Anesthesia Procedure Notes (Signed)
Anesthesia Regional Block:  Pectoralis block  Pre-Anesthetic Checklist: ,, timeout performed, Correct Patient, Correct Site, Correct Laterality, Correct Procedure, Correct Position, site marked, Risks and benefits discussed,  Surgical consent,  Pre-op evaluation,  At surgeon's request and post-op pain management  Laterality: Left  Prep: chloraprep       Needles:  Injection technique: Single-shot  Needle Type: Echogenic Needle     Needle Length: 9cm 9 cm Needle Gauge: 21 and 21 G    Additional Needles:  Procedures: ultrasound guided (picture in chart) Pectoralis block Narrative:  Start time: 12/18/2016 12:00 PM End time: 12/18/2016 12:10 PM Injection made incrementally with aspirations every 5 mL.  Performed by: Personally  Anesthesiologist: Catalina Gravel  Additional Notes: No pain on injection. No increased resistance to injection. Injection made in 5cc increments.  Good needle visualization.  Patient tolerated procedure well.

## 2016-12-18 NOTE — H&P (Signed)
Pre-op/Pre-procedure Orders Open     [facility]  [name], MD  General Surgery   Neoplasm of left breast, primary tumor staging category Tis: ductal carcinoma in situ (DCIS)  Dx   Additional Documentation   Encounter Info:   Billing Info,   History,   Allergies,   Detailed Report     All Notes   H&P by [name], MD a 3:10 PM   Author: [name], MD Author Type: Physician Filed: 74 3:22 PM  Note Status: Signed Cosign: Cosign Not Required Encounter Date: 13:10 PM  Editor: [name], MD (Physician)    Donna Matthews 8:00 AM Location: [facility] Patient #: [id number] DOB: September 18, 1976 Undefined / Language: Cleophus Molt / Race: Black or African American Female  History of Present Illness Marcello Moores A. Jaeley Wiker MD;  3:19 PM) Patient words: Pt sent at the request of Dr Lisbeth Renshaw for left breats microcalcifications detected on screening mammogram core bx DCIS and possible microinvasion. Pt is sore from her biopsy. She has some bruising. No other breast problems.            ADDITIONAL INFORMATION: PROGNOSTIC INDICATORS Results: IMMUNOHISTOCHEMICAL AND MORPHOMETRIC ANALYSIS PERFORMED MANUALLY Estrogen Receptor: 95%, POSITIVE, STRONG STAINING INTENSITY Progesterone Receptor: 95%, POSITIVE, STRONG STAINING INTENSITY REFERENCE RANGE ESTROGEN RECEPTOR NEGATIVE 0% POSITIVE =>1% REFERENCE RANGE PROGESTERONE RECEPTOR NEGATIVE 0% POSITIVE =>1% All controls stained appropriately Enid Cutter MD Pathologist, Electronic Signature ( Signed 11/13/2016) FINAL DIAGNOSIS Diagnosis Breast, left, needle core biopsy, UIQ DUCTAL CARCINOMA IN SITU WITH NECROSIS AND CALCIFICATIONS, GRADE 2-3 SUSPICIOUS FOR MICROSCOPIC INVASION Microscopic Comment ER and PR has been ordered. The [facility] has been informed (11/08/2016). 1 of 2 FINAL for Donna Matthews, Donna Matthews ([id number]) Microscopic Comment(continued) Casimer Lanius  MD Pathologist, Electronic Signature (Case signed 11/08/2016) Specimen Gross and Clinical Information Specimen Comment In formalin 3:45; calcifications Specimen(s) Obtained: Breast, left, needle core biopsy, UIQ Specimen Clinical Information R/O DCIS Gross Received in formalin in an Affirm coretainer (TIF: 3:45 pm), and labeled with the patient's name and "left breast calcs, upper inner" is a 2.5 x 1.8 x 0.3 cm aggregate of clotted blood and yellow fatty cores designated as calcs submitted in block 1A-B. The remaining 3.0 x 2.8 x 0.3 cm of clotted blood and          CLINICAL DATA: Screening. EXAM: 2D DIGITAL SCREENING BILATERAL MAMMOGRAM WITH CAD AND ADJUNCT TOMO COMPARISON: Previous exam(s). ACR Breast Density Category c: The breast tissue is heterogeneously dense, which may obscure small masses. FINDINGS: In the left breast, calcifications warrant further evaluation. In the right breast, no findings suspicious for malignancy. Images were processed with CAD. IMPRESSION: Further evaluation is suggested for calcifications in the left breast. RECOMMENDATION: Diagnostic mammogram of the left breast. (Code:[id number]) The patient will be contacted regarding the findings, and additional imaging will be scheduled. BI-RADS CATEGORY 0: Incomplete. Need additional imaging evaluation and/or prior mammograms for comparison. Electronically Signed By: [name] On: 10/30/2016 15:06 .  The patient is a 41 year old female.   Other Problems Tawni Pummel, RN; 1 Back Pain Breast Cancer Chest pain Gastroesophageal Reflux Disease Lump In Breast  Past Surgical History Tawni Pummel, RN;  8:00 AM) Breast Biopsy Left. Oral Surgery  Diagnostic Studies History Tawni Pummel, RN;  8:00 AM) Colonoscopy never Mammogram within last year Pap Smear 1-5 years ago  Medication History Tawni Pummel, RN;  8:01 AM) Medications  Reconciled  Social History Tawni Pummel, RN;  8:00 AM) Alcohol use Occasional  alcohol use. Caffeine use Carbonated beverages, Coffee, Tea. No drug use Tobacco use Never smoker.  Family History Tawni Pummel, RN;  8:00 AM) Alcohol Abuse Brother, Father. Arthritis Family Members In General, Mother. Bleeding disorder Mother. Cancer Family Members In General. Cerebrovascular Accident Family Members In [place] Members In General. Diabetes Mellitus Mother, Sister. Heart Disease Father. Heart disease in female family member before age 55 Hypertension Family Members In General, Mother, Sister. Ischemic Bowel Disease Family Members In General, Mother. Migraine Headache Family Members In General. Respiratory Condition Family Members In General.  Pregnancy / Birth History Tawni Pummel, RN;  8:00 AM) Age at menarche 60 years. Contraceptive History Oral contraceptives. Gravida 2 Maternal age 20-25 Regular periods     Review of Systems Sunday Spillers Ledford RN; 8:00 AM) General Present- Fatigue and Weight Loss. Not Present- Appetite Loss, Chills, Fever, Night Sweats and Weight Gain. Skin Not Present- Change in Wart/Mole, Dryness, Hives, Jaundice, New Lesions, Non-Healing Wounds, Rash and Ulcer. HEENT Present- Wears glasses/contact lenses. Not Present- Earache, Hearing Loss, Hoarseness, Nose Bleed, Oral Ulcers, Ringing in the Ears, Seasonal Allergies, Sinus Pain, Sore Throat, Visual Disturbances and Yellow Eyes. Respiratory Present- Difficulty Breathing. Not Present- Bloody sputum, Chronic Cough, Snoring and Wheezing. Breast Present- Nipple Discharge. Not Present- Breast Mass, Breast Pain and Skin Changes. Cardiovascular Not Present- Chest Pain, Difficulty Breathing Lying Down, Leg Cramps, Palpitations, Rapid Heart Rate, Shortness of Breath and Swelling of Extremities. Gastrointestinal Not Present- Abdominal Pain, Bloating, Bloody Stool,  Change in Bowel Habits, Chronic diarrhea, Constipation, Difficulty Swallowing, Excessive gas, Gets full quickly at meals, Hemorrhoids, Indigestion, Nausea, Rectal Pain and Vomiting. Female Genitourinary Not Present- Frequency, Nocturia, Painful Urination, Pelvic Pain and Urgency. Musculoskeletal Not Present- Back Pain, Joint Pain, Joint Stiffness, Muscle Pain, Muscle Weakness and Swelling of Extremities. Neurological Not Present- Decreased Memory, Fainting, Headaches, Numbness, Seizures, Tingling, Tremor, Trouble walking and Weakness. Psychiatric Not Present- Anxiety, Bipolar, Change in Sleep Pattern, Depression, Fearful and Frequent crying. Endocrine Present- Hair Changes. Not Present- Cold Intolerance, Excessive Hunger, Heat Intolerance, Hot flashes and New Diabetes. Hematology Present- Easy Bruising. Not Present- Blood Thinners, Excessive bleeding, Gland problems, HIV and Persistent Infections.   Physical Exam (Storm Dulski A. Prestyn Mahn MD; :20 PM)  General Mental Status-Alert. General Appearance-Consistent with stated age. Hydration-Well hydrated. Voice-Normal.  Head and Neck Head-normocephalic, atraumatic with no lesions or palpable masses. Trachea-midline. Thyroid Gland Characteristics - normal size and consistency.  Eye Eyeball - Bilateral-Extraocular movements intact. Sclera/Conjunctiva - Bilateral-No scleral icterus.  Chest and Lung Exam Chest and lung exam reveals -quiet, even and easy respiratory effort with no use of accessory muscles and on auscultation, normal breath sounds, no adventitious sounds and normal vocal resonance. Inspection Chest Wall - Normal. Back - normal.  Breast Note: bruising left breats at 6 oclock  right breast normal  Cardiovascular Cardiovascular examination reveals -normal heart sounds, regular rate and rhythm with no murmurs and normal pedal pulses bilaterally.  Neurologic Neurologic evaluation reveals -alert and  oriented x 3 with no impairment of recent or remote memory. Mental Status-Normal.  Musculoskeletal Normal Exam - Left-Upper Extremity Strength Normal and Lower Extremity Strength Normal. Normal Exam - Right-Upper Extremity Strength Normal and Lower Extremity Strength Normal.  Lymphatic Head & Neck  General Head & Neck Lymphatics: Bilateral - Description - Normal. Axillary  General Axillary Region: Bilateral - Description - Normal. Tenderness - Non Tender.    Assessment & Plan (Tersea Aulds A. 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 Pt agrees to proceed. Risk of sentinel lymph node mapping include bleeding, infection, lymphedema, shoulder pain. stiffness, dye allergy. cosmetic deformity , blood clots, death, need for more surgery. Pt agres to proceed.  Current Plans You are being scheduled for surgery - Our schedulers will call you.  You should hear from our office's scheduling department within 5 working days about the location, date, and time of surgery. We try to make accommodations for patient's preferences in scheduling surgery, but sometimes the OR schedule or the surgeon's schedule prevents [place] from making those accommodations.  If you have not heard from our office [phone]) in 5 working days, call the office and ask for your surgeon's nurse.  If you have other questions about your diagnosis, plan, or surgery, call the office and ask for your surgeon's nurse.  Pt Education - [facility] Breast Cancer Information Given - [employer] "Breast Journey" Package Pt Education - Pamphlet Given - Breast  Biopsy: discussed with patient and provided information. We discussed the staging and pathophysiology of breast cancer. We discussed all of the different options for treatment for breast cancer including surgery, chemotherapy, radiation therapy, Herceptin, and antiestrogen therapy. We discussed a sentinel lymph node biopsy as she does not appear to having lymph node involvement right now. We discussed the performance of that with injection of radioactive tracer and blue dye. We discussed that she would have an incision underneath her axillary hairline. We discussed that there is a bout a 10-20% chance of having a positive node with a sentinel lymph node biopsy and we will await the permanent pathology to make any other first further decisions in terms of her treatment. One of these options might be to return to the operating room to perform an axillary lymph node dissection. We discussed about a 1-2% risk lifetime of chronic shoulder pain as well as lymphedema associated with a sentinel lymph node biopsy. We discussed the options for treatment of the breast cancer which included lumpectomy versus a mastectomy. We discussed the performance of the lumpectomy with a wire placement. We discussed a 10-20% chance of a positive margin requiring reexcision in the operating room. We also discussed that she may need radiation therapy or antiestrogen therapy or both if she undergoes lumpectomy. We discussed the mastectomy and the postoperative care for that as well. We discussed that there is no difference in her survival whether she undergoes lumpectomy with radiation therapy or antiestrogen therapy versus a mastectomy. There is a slight difference in the local recurrence rate being 3-5% with lumpectomy and about 1% with a mastectomy. We discussed the risks of operation including bleeding, infection, possible reoperation. She understands her further therapy will be based on what her stages at the time of her  operation.  Pt Education - flb breast cancer surgery: discussed with patient and provided information. Pt Education - [facility] Breast Biopsy HCI: discussed with patient and provided information. Pt Education - ABC (After Breast Cancer) Class Info: discussed with patient and provided information.

## 2016-12-18 NOTE — Transfer of Care (Signed)
Immediate Anesthesia Transfer of Care Note  Patient: Donna Matthews  Procedure(s) Performed: Procedure(s): RADIOACTIVE SEED GUIDED LEFT BREAST LUMPECTOMY WITH LEFT AXILLARY SENTINEL LYMPH NODE BIOPSY (Left)  Patient Location: PACU  Anesthesia Type:General and regional for postop pain  Level of Consciousness: awake, alert  and oriented  Airway & Oxygen Therapy: Patient Spontanous Breathing and Patient connected to nasal cannula oxygen  Post-op Assessment: Report given to RN, Post -op Vital signs reviewed and stable and Patient moving all extremities X 4  Post vital signs: Reviewed and stable  Last Vitals:  Vitals:   12/18/16 1220 12/18/16 1429  BP: 137/68   Pulse: 96   Resp: 17 (P) 13  Temp:  (P) 36.6 C    Last Pain:  Vitals:   12/18/16 1429  TempSrc:   PainSc: (P) 0-No pain      Patients Stated Pain Goal: 5 ([id number])  Complications: No apparent anesthesia complications

## 2016-12-18 NOTE — Op Note (Signed)
Preoperative diagnosis: Left breast DCIS with microinvasion  Postop diagnosis: Same  Procedure: Left breast seed localized partial mastectomy with left axillary sentinel lymph node mapping of the axillary lymph nodes  Surgeon: [name] M.D.  Anesthesia: LMA with pectoral block and 0.25% Sensorcaine local with epinephrine  EBL: Minimal  Specimen: Left breast mass with CT including specimen. Four  left axillary sentinel nodes that were hot.  Indications for procedure: The patient presents for left breast lumpectomy after initial workup and diagnosis showed left breast DCIS with possible microinvasion. She was seen in the multidisciplinary breast clinic. Risks, benefits and alternatives to surgery were discussed with the patient.The procedure has been discussed with the patient. Alternatives to surgery have been discussed with the patient.  Risks of surgery include bleeding,  Infection,  Seroma formation, death,  and the need for further surgery.   The patient understands and wishes to proceed.Sentinel lymph node mapping and dissection has been discussed with the patient.  Risk of bleeding,  Infection,  Seroma formation,  Additional procedures,,  Shoulder weakness ,  Shoulder stiffness,  Nerve and blood vessel injury and reaction to the mapping dyes have been discussed.  Alternatives to surgery have been discussed with the patient.  The patient agrees to proceed.  Description of procedure: The patient was met in the holding area. She was placed by radiology earlier this week. She underwent injection the left breast with technetium soft colloid. Left breast was marked as the correct side. Questions were answered. She was taken back to the operating room and placed upon the OR table. After induction of general anesthesia the left breast is prepped and draped in sterile fashion. Timeout was done. She received a block in the holding area by anesthesia. Neoprobe was used to see was localized left  breast upper central portion the breast. Transverse incision was made over this after infiltration of local anesthetic. All tissue around the seating clip were excised and radiograph revealed both to be in the specimen. These are sent to pathology. The cavity is irrigated and clips are placed. He was closed with 3-0 Vicryl for Monocryl after ensuring hemostasis.  The probe settings were changed technetium. Left axilla was then addressed. Hotspot identified in transverse incision incision made in the left axilla. At this point I'm dissection was carried down for hot sentinel nodes identified in the deep axillary basin. These were removed. Background counts approached baseline. Cavities made hemostatic with cautery and Surgicel snow was placed. This was closed with 3-0 Vicryl and 4-0 Monocryl. All final counts found to be correct. A breast binder was  placed. The patient was awoke extubated taken to recovery in satisfactory condition.

## 2016-12-19 ENCOUNTER — Encounter (HOSPITAL_COMMUNITY): Payer: Self-pay | Admitting: Surgery

## 2016-12-24 NOTE — Assessment & Plan Note (Signed)
12/18/16: Left Lumpectomy: HG DCIS 1.2 cm, Margins Neg, 0/7 LN Neg; TisN0 (Stage 1 A) ER 95%, PR 95%  Pathology counseling: I discussed the final pathology report of the patient provided  a copy of this report. I discussed the margins as well as lymph node surgeries. We also discussed the final staging along with previously performed ER/PR testing.  Treatment Plan:  1. Adjuvant radiation therapy 2. Followed by antiestrogen therapy with tamoxifen 5 years ; RTC in 3 months to start Tamoxifen

## 2016-12-25 ENCOUNTER — Ambulatory Visit (HOSPITAL_BASED_OUTPATIENT_CLINIC_OR_DEPARTMENT_OTHER): Payer: Managed Care, Other (non HMO) | Admitting: Hematology and Oncology

## 2016-12-25 ENCOUNTER — Encounter: Payer: Self-pay | Admitting: Hematology and Oncology

## 2016-12-25 DIAGNOSIS — C50212 Malignant neoplasm of upper-inner quadrant of left female breast: Secondary | ICD-10-CM

## 2016-12-25 DIAGNOSIS — Z170 Estrogen receptor positive status [ER+]: Secondary | ICD-10-CM | POA: Diagnosis not present

## 2016-12-25 MED ORDER — TAMOXIFEN CITRATE 20 MG PO TABS: 20.0000 mg | ORAL_TABLET | Freq: Every day | ORAL | 3 refills | Status: DC

## 2016-12-25 NOTE — Progress Notes (Signed)
Patient Care Team: Donna Huxley, PA as PCP - General (Family Medicine) [name], MD as Consulting Physician (General Surgery) Nicholas Lose, MD as Consulting Physician (Hematology and Oncology) Kyung Rudd, MD as Consulting Physician (Radiation Oncology)  DIAGNOSIS:  Encounter Diagnosis  Name Primary?  . 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  The Hereditary Gene Panel offered by [facility] includes sequencing and/or deletion duplication testing of the following 43 genes: APC, ATM, AXIN2, BARD1, BMPR1A, BRCA1, BRCA2, BRIP1, CDH1, CDKN2A (p14ARF), CDKN2A (p16INK4a), CHEK2, DICER1, EPCAM (Deletion/duplication testing only), GREM1 (promoter region deletion/duplication testing only), KIT, MEN1, MLH1, MSH2, MSH6, MUTYH, NBN, NF1, PALB2, PDGFRA, PMS2, POLD1, POLE, PTEN, RAD50, RAD51C, RAD51D, SDHB, SDHC, SDHD, SMAD4, SMARCA4. STK11, TP53, TSC1, TSC2, and VHL.  The following gene was evaluated for sequence changes only: SDHA and HOXB13 c.251G>A.  The report date is December 02, 2016.      12/18/2016 Surgery    Left Lumpectomy: HG DCIS 1.2 cm, Margins Neg, 0/7 LN Neg; TisN0 (Stage 1 A)       CHIEF COMPLIANT: Follow-up after recent lumpectomy  INTERVAL HISTORY: Donna Matthews is a 41 year old with above-mentioned history of left breast DCIS who underwent lumpectomy and is here for discussion of pathology. She is healing quite well from the recent surgery. She has mild discomfort and has not required  any pain medications for the past several days. She has an open wound in the axilla which is draining. She is following with surgery.  REVIEW OF SYSTEMS:   Constitutional: Denies fevers, chills or abnormal weight loss Eyes: Denies blurriness of vision Ears, nose, mouth, throat, and face: Denies mucositis or sore throat Respiratory: Denies cough, dyspnea or wheezes Cardiovascular: Denies palpitation, chest discomfort Gastrointestinal:  Denies nausea, heartburn or change in bowel habits Skin: Denies abnormal skin rashes Lymphatics: Denies new lymphadenopathy or easy bruising Neurological:Denies numbness, tingling or new weaknesses Behavioral/Psych: Mood is stable, no new changes  Extremities: No lower extremity edema Breast:  Recent left lumpectomy, open wound in the left axilla which is being followed by surgery All other systems were reviewed with the patient and are negative.  I have reviewed the past medical history, past surgical history, social history and family history with the patient and they are unchanged from previous note.  ALLERGIES:  is allergic to bee venom and penicillins.  MEDICATIONS:  Current Outpatient Prescriptions  Medication Sig Dispense Refill  . acetaminophen (TYLENOL) 500 MG tablet Take 1,000 mg by mouth every 6 (six) hours as needed for mild pain.    . IRON PO Take 1 tablet by mouth daily.     . Multiple Vitamin (MULTIVITAMIN) tablet Take 1 tablet by mouth daily.    Marland Kitchen nystatin-triamcinolone ointment (MYCOLOG) Apply 1 application topically 2 (two) times daily. (Patient not taking: Reported on 11/14/2016) 60 g 0  . oxyCODONE-acetaminophen (ROXICET) 5-325 MG tablet Take 1-2 tablets by mouth every 4 (four) hours as needed. 20 tablet 0  . Vitamin D, Ergocalciferol, (DRISDOL) 50000 units CAPS  capsule Take 1 capsule (50,000 Units total) by mouth every 7 (seven) days. (Patient taking differently: Take 50,000 Units by mouth every 7 (seven) days. Sunday) 12 capsule 0    No current facility-administered medications for this visit.     PHYSICAL EXAMINATION: ECOG PERFORMANCE STATUS: 1 - Symptomatic but completely ambulatory  Vitals:   12/25/16 1519  BP: 125/72  Pulse: 89  Resp: 18  Temp: 98.1 F (36.7 C)   Filed Weights   12/25/16 1519  Weight: 169 lb 14.4 oz (77.1 kg)    GENERAL:alert, no distress and comfortable SKIN: skin color, texture, turgor are normal, no rashes or significant lesions EYES: normal, Conjunctiva are pink and non-injected, sclera clear OROPHARYNX:no exudate, no erythema and lips, buccal mucosa, and tongue normal  NECK: supple, thyroid normal size, non-tender, without nodularity LYMPH:  no palpable lymphadenopathy in the cervical, axillary or inguinal LUNGS: clear to auscultation and percussion with normal breathing effort HEART: regular rate & rhythm and no murmurs and no lower extremity edema ABDOMEN:abdomen soft, non-tender and normal bowel sounds MUSCULOSKELETAL:no cyanosis of digits and no clubbing  NEURO: alert & oriented x 3 with fluent speech, no focal motor/sensory deficits EXTREMITIES: No lower extremity edema  LABORATORY DATA:  I have reviewed the data as listed   Chemistry      Component Value Date/Time   NA 141 11/14/2016 1221   K 3.9 11/14/2016 1221   CL 107 10/12/2016 1009   CO2 25 11/14/2016 1221   BUN 8.3 11/14/2016 1221   CREATININE 0.7 11/14/2016 1221      Component Value Date/Time   CALCIUM 8.9 11/14/2016 1221   ALKPHOS 64 11/14/2016 1221   AST 11 11/14/2016 1221   ALT 10 11/14/2016 1221   BILITOT 0.42 11/14/2016 1221       Lab Results  Component Value Date   WBC 5.6 12/13/2016   HGB 8.7 (L) 12/13/2016   HCT 27.7 (L) 12/13/2016   MCV 76.5 (L) 12/13/2016   PLT 303 12/13/2016   NEUTROABS 2.4 11/14/2016    ASSESSMENT & PLAN:  Malignant neoplasm of upper-inner quadrant of left female breast ([facility]) 12/18/16: Left Lumpectomy: HG DCIS 1.2 cm, Margins Neg, 0/7 LN Neg; TisN0 (Stage 1 A)  ER 95%, PR 95%  Pathology counseling: I discussed the final pathology report of the patient provided  a copy of this report. I discussed the margins as well as lymph node surgeries. We also discussed the final staging along with previously performed ER/PR testing. Patient has had wound healing issues and is currently on antibiotic.  Treatment Plan:  1. Adjuvant radiation therapy 2. Followed by antiestrogen therapy with tamoxifen 5 years, will start this after radiation is complete approximately April 1  Tamoxifen counseling:We discussed the risks and benefits of tamoxifen. These include but not limited to insomnia, hot flashes, mood changes, vaginal dryness, and weight gain. Although rare, serious side effects including endometrial cancer, risk of blood clots were also discussed. We strongly believe that the benefits far outweigh the risks. Patient understands these risks and consented to starting treatment. Planned treatment duration is 5 years.  RTC in one month after starting tamoxifen approximately early May 2008   I spent 25 minutes talking to the patient of which more than half was spent in counseling and coordination of care.  No orders of the defined types were placed in this encounter.  The patient has a good understanding of the overall plan. she agrees with it. she will call with any problems that  may develop before the next visit here.   Rulon Eisenmenger, MD 12/25/16

## 2017-01-01 ENCOUNTER — Encounter: Payer: Self-pay | Admitting: Radiation Oncology

## 2017-01-01 NOTE — Progress Notes (Addendum)
Location of Breast Cancer: Left Breast  Upper Inner Quadrant   Histology per Pathology Report: Diagnosis 11/07/2016: Breast, left, needle core biopsy, UIQ DUCTAL CARCINOMA IN SITU WITH NECROSIS AND CALCIFICATIONS, GRADE 2-3 SUSPICIOUS FOR MICROSCOPIC INVASION  Receptor Status: ER(95%+), PR (95%+), Her2-neu (), Ki-()  Did patient present with symptoms (if so, please note symptoms) or was this found on screening mammography?: Screening  mammogram  Past/Anticipated interventions by surgeon, if TWS:[id number] : 12/18/2016 : Dr. [name], MD Procedure: Left breast seed localized partial mastectomy with left axillary sentinel lymph node mapping of the axillary lymph nodes. 1. Breast, lumpectomy, Left - HIGH GRADE DUCTAL CARCINOMA IN SITU WITH NECROSIS AND CALCIFICATIONS, SPANNING 1.2 CM. - RESECTION MARGINS ARE NEGATIVE FOR CARCINOMA. - BIOPSY SITE. - SEE ONCOLOGY TABLE. 2. Lymph node, sentinel, biopsy, Left Axillary - ONE OF ONE LYMPH NODES NEGATIVE FOR CARCINOMA (0/1). 3. Lymph node, sentinel, biopsy, Left - ONE OF ONE LYMPH NODES NEGATIVE FOR CARCINOMA (0/1). 4. Lymph node, sentinel, biopsy, Left - ONE OF ONE LYMPH NODES NEGATIVE FOR CARCINOMA (0/1). 5. Lymph node, sentinel, biopsy, Left - ONE OF ONE LYMPH NODES NEGATIVE FOR CARCINOMA (0/1). 6. Lymph node, sentinel, biopsy, Left - ONE OF ONE LYMPH NODES NEGATIVE FOR CARCINOMA (0/1). 7. Lymph node, sentinel, biopsy, Left - ONE OF ONE LYMPH NODES NEGATIVE FOR CARCINOMA (0/1). 8. Lymph node, sentinel, biopsy, Left - ONE OF ONE LYMPH NODES NEGATIVE FOR CARCINOMA (0/1).  Past/Anticipated interventions by medical oncology, if any: Chemotherapy :,  Dr. Lindi Adie note 12/25/16, , .has wound healing under axilla, Tamoxifen to start after radiation follow up appt.  5/1/218 12/02/16 Genetic testing neg  Gernetic testing neg  Lymphedema issues, if any:  NO  Pain issues, if any: numbness  under left axilla,  Drainage at incision axilla and also  on breast incision  , slight swelling,   SAFETY ISSUES: No  Prior radiation? NO  Pacemaker/ICD? No  Possible current pregnancy? NO, last menses 12/07/16 for 7 days  Is the patient on methotrexate?   Current Complaints / other details:  Seen in breast clinic with Bryson Ha 11/14/16, G2P) 1 miscarriage 1 tubal pregnancy Maternal Aunt  lung cancer, was a smoker,  Had radiation here  Last year Marsa Aris) Maternal; Uncle melanoma, and 2nd uncle lung cancer, both smokers, paternal great aunt and her daughter  Had stomach cancer    BP 124/75 (BP Location: Right Arm, Patient Position: Sitting, Cuff Size: Normal)   Pulse 66   Temp 98.4 F (36.9 C) (Oral)   Resp 16   Ht 5' 4"  (1.626 m)   Wt 170 lb 6.4 oz (77.3 kg)   LMP 12/07/2016   BMI 29.25 kg/m   Wt Readings from Last 3 Encounters:  01/02/17 170 lb 6.4 oz (77.3 kg)  12/25/16 169 lb 14.4 oz (77.1 kg)  12/13/16 171 lb 2 oz (77.6 kg)   Rebecca Eaton, RN 01/01/2017,1:23 PM

## 2017-01-02 ENCOUNTER — Encounter: Payer: Self-pay | Admitting: Radiation Oncology

## 2017-01-02 ENCOUNTER — Ambulatory Visit
Admission: RE | Admit: 2017-01-02 | Discharge: 2017-01-02 | Disposition: A | Discharge status: Home / Self Care | Payer: Managed Care, Other (non HMO) | Source: Ambulatory Visit | Attending: Radiation Oncology | Admitting: Radiation Oncology

## 2017-01-02 VITALS — BP 124/75 | HR 66 | Temp 98.4°F | Resp 16 | Ht 64.0 in | Wt 170.4 lb

## 2017-01-02 DIAGNOSIS — Z8249 Family history of ischemic heart disease and other diseases of the circulatory system: Secondary | ICD-10-CM | POA: Diagnosis not present

## 2017-01-02 DIAGNOSIS — K219 Gastro-esophageal reflux disease without esophagitis: Secondary | ICD-10-CM | POA: Diagnosis not present

## 2017-01-02 DIAGNOSIS — Z880 Allergy status to penicillin: Secondary | ICD-10-CM | POA: Insufficient documentation

## 2017-01-02 DIAGNOSIS — Z170 Estrogen receptor positive status [ER+]: Secondary | ICD-10-CM | POA: Diagnosis not present

## 2017-01-02 DIAGNOSIS — C50212 Malignant neoplasm of upper-inner quadrant of left female breast: Secondary | ICD-10-CM

## 2017-01-02 DIAGNOSIS — D0512 Intraductal carcinoma in situ of left breast: Secondary | ICD-10-CM | POA: Insufficient documentation

## 2017-01-02 DIAGNOSIS — Z833 Family history of diabetes mellitus: Secondary | ICD-10-CM | POA: Insufficient documentation

## 2017-01-02 DIAGNOSIS — Z510 Encounter for antineoplastic radiation therapy: Secondary | ICD-10-CM | POA: Diagnosis present

## 2017-01-02 HISTORY — DX: Malignant neoplasm of unspecified site of unspecified female breast: C50.919

## 2017-01-02 NOTE — Progress Notes (Signed)
Radiation Oncology         [phone] ________________________________  Name: Donna Matthews MRN: [id number]  Date: 01/02/2017  DOB: 06-Jan-1976  JO:ACZYSA, Donna Darnell Level, PA  Nicholas Lose, MD     REFERRING PHYSICIAN: Nicholas Lose, MD  DIAGNOSIS: There were no encounter diagnoses.  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 female originally seen in the multidisciplinary breast clinic on 11/14/16 for a new diagnosis of left breast cancer. She underwent screening mammogram which revealed left breast distortion and calcifications. She then underwent diagnostic imaging which revealed a 1.2 cm area of calcifications and biopsy on 11/07/16 which revealed intermediate to high grade, ER/PR positive, ductal carcinoma in situ with necrosis. There was questionable concern for invasive disease. She was subsequently referred to radiation oncology on 11/14/16 to discuss the options for treatment of her disease.  The patient was seen by genetic counseling on 11/21/16. Genetic testing was negative for any mutations. Bilateral breast MRI on 12/04/16 noted a 0.8 cm area of DCIS in the upper inner left breast and no evidence of malignancy. She underwent a left lumpectomy and left axillary sentinel lymph node sampling on 12/18/16 by Dr. Brantley Stage. Lumpectomy revealed high grade DCIS with necrosis and calcifications meauring 1.2 cm and the resection margins were negative for carcinoma. Seven biopsied left axillary sentinel lymph nodes were negative for carcinoma.  The patient saw Dr. Lindi Adie on 12/25/16 who discussed antiestrogen therapy with Tamoxifen for 5 years after adjuvant radiation therapy. The patient presents today to further discuss radiation for the management of her disease. The patient did have an infection after surgery, but this has since cleared.  PREVIOUS RADIATION THERAPY: No   PAST MEDICAL HISTORY:  Past Medical History:    Diagnosis Date  . Anemia   . Breast cancer ([place])   . Ectopic pregnancy   . Family history of melanoma   . Family history of stomach cancer   . Fibroids   . GERD (gastroesophageal reflux disease)   . Reflux   . STD (sexually transmitted disease)    Chlamydia       PAST SURGICAL HISTORY: Past Surgical History:  Procedure Laterality Date  . BREAST LUMPECTOMY WITH RADIOACTIVE SEED AND SENTINEL LYMPH NODE BIOPSY Left 12/18/2016   Procedure: RADIOACTIVE SEED GUIDED LEFT BREAST LUMPECTOMY WITH LEFT AXILLARY SENTINEL LYMPH NODE BIOPSY;  Surgeon: [name], MD;  Location: [facility];  Service: General;  Laterality: Left;  . DILATION AND CURETTAGE OF UTERUS       FAMILY HISTORY:  Family History  Problem Relation Age of Onset  . Diabetes Mother   . Hypertension Mother   . Heart disease Father   . Diabetes Sister   . Hypertension Sister   . Lung cancer Maternal Aunt   . Melanoma Maternal Uncle   . Lung cancer Maternal Uncle   . Stomach cancer Other     paternal great aunt  . Stomach cancer Cousin     father's maternal cousin  . Cancer Other     2 maternal great aunts with unknown cancer     SOCIAL HISTORY:  reports that she has never smoked. She has never used smokeless tobacco. She reports that she does not drink alcohol or use drugs. The patient is single and resides in [place]. 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   ALLERGIES: Bee venom and Penicillins   MEDICATIONS:  Current Outpatient Prescriptions  Medication Sig Dispense Refill  . acetaminophen (TYLENOL) 500 MG tablet Take 1,000 mg by mouth every 6 (six) hours as needed for mild pain.    . cephALEXin (KEFLEX) 250 MG capsule Take 250 mg by mouth. 40tablets    . IRON PO Take 1 tablet by mouth daily.     . Multiple Vitamin (MULTIVITAMIN) tablet Take 1 tablet by mouth daily.    Marland Kitchen nystatin-triamcinolone ointment (MYCOLOG) Apply 1 application topically 2 (two) times daily. (Patient not taking: Reported on  11/14/2016) 60 g 0  . oxyCODONE-acetaminophen (PERCOCET/ROXICET) 5-325 MG tablet Take 1 tablet by mouth.    Derrill Memo ON 03/10/2017] tamoxifen (NOLVADEX) 20 MG tablet Take 1 tablet (20 mg total) by mouth daily. 90 tablet 3  . Vitamin D, Ergocalciferol, (DRISDOL) 50000 units CAPS capsule Take 1 capsule (50,000 Units total) by mouth every 7 (seven) days. (Patient taking differently: Take 50,000 Units by mouth every 7 (seven) days. Sunday) 12 capsule 0   No current facility-administered medications for this encounter.      REVIEW OF SYSTEMS: On review of systems, the patient reports that she is doing well overall since surgery, and since recovering from her infection. She denies any chest pain, shortness of breath, cough, fevers, chills, night sweats, unintended weight changes. She denies any bowel or bladder disturbances, and denies abdominal pain, nausea or vomiting. She denies any new musculoskeletal or joint aches or pains. A complete review of systems is obtained and is otherwise negative.   PHYSICAL EXAM:  Wt Readings from Last 3 Encounters:  12/25/16 169 lb 14.4 oz (77.1 kg)  12/13/16 171 lb 2 oz (77.6 kg)  11/14/16 167 lb 1.6 oz (75.8 kg)   Temp Readings from Last 3 Encounters:  12/25/16 98.1 F (36.7 C) (Oral)  12/18/16 98.1 F (36.7 C)  12/13/16 98.5 F (36.9 C) (Oral)   BP Readings from Last 3 Encounters:  12/25/16 125/72  12/18/16 125/78  12/13/16 119/72   Pulse Readings from Last 3 Encounters:  12/25/16 89  12/18/16 75  12/13/16 76   In general this is a well appearing African-American female in no acute distress. She's alert and oriented x4 and appropriate throughout the examination. Cardiopulmonary assessment is negative for acute distress and she exhibits normal effort. Breast exam was notable for a healing left lumpectomy scar with intact dermabond. Along the medial margin of the incision there is slight separation of the derma bond and some small amount of serous fluid  noted on her clothing. Axillary incision healing well, no evidence of cellulitic change or separation. No edema of the left upper chest wall or left extremities.   LABORATORY DATA:  Lab Results  Component Value Date   WBC 5.6 12/13/2016   HGB 8.7 (L) 12/13/2016   HCT 27.7 (L) 12/13/2016   MCV 76.5 (L) 12/13/2016   PLT 303 12/13/2016   Lab Results  Component Value Date   NA 141 11/14/2016   K 3.9 11/14/2016   CL 107 10/12/2016   CO2 25 11/14/2016   Lab Results  Component Value Date   ALT 10 11/14/2016   AST 11 11/14/2016   ALKPHOS 64 11/14/2016   BILITOT 0.42 11/14/2016      RADIOGRAPHY: Mr Breast Bilateral W Wo Contrast  Result Date: 12/04/2016 CLINICAL DATA:  Patient had a recent biopsy of left breast calcifications which revealed ductal carcinoma in situ with an area suspicious for microscopic invasion. LABS:  No labs needed at time of imaging  EXAM: BILATERAL BREAST MRI WITH AND WITHOUT CONTRAST TECHNIQUE: Multiplanar, multisequence MR images of both breasts were obtained prior to and following the intravenous administration of 15 ml of MultiHance. THREE-DIMENSIONAL MR IMAGE RENDERING ON INDEPENDENT WORKSTATION: Three-dimensional MR images were rendered by post-processing of the original MR data on an independent workstation. The three-dimensional MR images were interpreted, and findings are reported in the following complete MRI report for this study. Three dimensional images were evaluated at the independent DynaCad workstation COMPARISON:  Previous exam(s). FINDINGS: Breast composition: c. Heterogeneous fibroglandular tissue. Background parenchymal enhancement: Marked Right breast: No mass or abnormal enhancement. Left breast: Focal susceptibility artifact is noted in the upper inner left breast reflecting the biopsy clip. There is a rim of adjacent enhancement spanning 8 mm, with portion showing moderate initial enhancement and mild washout kinetics. There are no other areas of  abnormal enhancement. There are no discrete masses. Lymph nodes: No abnormal appearing lymph nodes. Ancillary findings:  None. IMPRESSION: 1. 8 mm area of DCIS in the upper inner left breast. 2. No other evidence of malignancy in the left breast. 3. No evidence of malignancy in the right breast. 4. No enlarged or abnormal axillary lymph nodes. RECOMMENDATION: Treatment as planned for the known left breast carcinoma. BI-RADS CATEGORY  6: Known biopsy-proven malignancy. Electronically Signed   By: Lajean Manes M.D.   On: 12/04/2016 16:11   Mm Breast Surgical Specimen  Result Date: 12/18/2016 CLINICAL DATA:  Post left breast lumpectomy for DCIS. EXAM: SPECIMEN RADIOGRAPH OF THE LEFT BREAST COMPARISON:  Previous exam(s). FINDINGS: Status post excision of the left breast. The radioactive seed and biopsy marker clip are present, completely intact, and were marked for pathology. IMPRESSION: Specimen radiograph of the left breast. Electronically Signed   By: Fidela Salisbury M.D.   On: 12/18/2016 13:51   Mm Lt Radioactive Seed Loc Mammo Guide  Result Date: 12/17/2016 CLINICAL DATA:  41 year old female for seed localization of left breast biopsy site demonstrating ductal carcinoma in situ EXAM: MAMMOGRAPHIC GUIDED RADIOACTIVE SEED LOCALIZATION OF THE LEFT BREAST COMPARISON:  Previous exam(s). FINDINGS: Patient presents for radioactive seed localization prior to left lumpectomy. I met with the patient and we discussed the procedure of seed localization including benefits and alternatives. We discussed the high likelihood of a successful procedure. We discussed the risks of the procedure including infection, bleeding, tissue injury and further surgery. We discussed the low dose of radioactivity involved in the procedure. Informed, written consent was given. The usual time-out protocol was performed immediately prior to the procedure. Using mammographic guidance, sterile technique, 1% lidocaine and an I-125  radioactive seed, the ribbon shaped biopsy marker in the superior left breast was localized using a superior to inferior approach. The follow-up mammogram images confirm the seed in the expected location and were marked for Dr. Brantley Stage. Follow-up survey of the patient confirms presence of the radioactive seed. Order number of I-125 seed:  [id number]. Total activity:  0.252 mCi  Reference Date: 11/26/2016 The patient tolerated the procedure well and was released from the [facility]. She was given instructions regarding seed removal. IMPRESSION: Radioactive seed localization of the left breast. No apparent complications. Electronically Signed   By: Pamelia Hoit M.D.   On: 12/17/2016 15:28       IMPRESSION/PLAN: 1. ER/PR positive, high grade DCIS of the left breast.Dr. Lisbeth Renshaw met with the patient today to review her pathology results, and regarding her diagnosis and options for treatment.  We discussed the role of radiation in  decreasing local failures in patients who undergo lumpectomy. We discussed the process of simulation and the placement tattoos. We discussed 6 1/2 weeks of treatment as an outpatient. We discussed the possibility of asymptomatic lung damage as well as the use of cardiac sparing with deep inspiration breath hold. We discussed the low likelihood of secondary malignancies. We discussed the possible side effects including but not limited to skin redness, fatigue, permanent skin darkening, and breast swelling. The patient signed a consent form and CT simulation is scheduled on 01/10/17.   The above documentation reflects my direct findings during this shared patient visit. Please see the separate note by Dr. Lisbeth Renshaw on this date for the remainder of the patient's plan of care   In a visit lasting 25 minutes, greater than 50% of the time was spent face to face discussing the logistics of radiotherapy, and coordinating the patient's care.    Carola Rhine, PAC  This document serves as a  record of services personally performed by Shona Simpson, PA-C and Kyung Rudd, MD. It was created on their behalf by Darcus Austin, a trained medical scribe. The creation of this record is based on the scribe's personal observations and the providers' statements to them. This document has been checked and approved by the attending provider.

## 2017-01-09 ENCOUNTER — Other Ambulatory Visit: Payer: Self-pay | Admitting: Women's Health

## 2017-01-10 ENCOUNTER — Ambulatory Visit
Admission: RE | Admit: 2017-01-10 | Discharge: 2017-01-10 | Disposition: A | Discharge status: Home / Self Care | Payer: Managed Care, Other (non HMO) | Source: Ambulatory Visit | Attending: Radiation Oncology | Admitting: Radiation Oncology

## 2017-01-10 DIAGNOSIS — C50212 Malignant neoplasm of upper-inner quadrant of left female breast: Secondary | ICD-10-CM

## 2017-01-10 DIAGNOSIS — Z510 Encounter for antineoplastic radiation therapy: Secondary | ICD-10-CM | POA: Diagnosis not present

## 2017-01-10 DIAGNOSIS — Z170 Estrogen receptor positive status [ER+]: Principal | ICD-10-CM

## 2017-01-10 NOTE — Progress Notes (Signed)
  Radiation Oncology         [phone]) [phone] ________________________________  Name: Donna Matthews MRN: [id number]  Date: 01/10/2017  DOB: [birth year]  Optical Surface Tracking Plan:  Since intensity modulated radiotherapy (IMRT) and 3D conformal radiation treatment methods are predicated on accurate and precise positioning for treatment, intrafraction motion monitoring is medically necessary to ensure accurate and safe treatment delivery.  The ability to quantify intrafraction motion without excessive ionizing radiation dose can only be performed with optical surface tracking. Accordingly, surface imaging offers the opportunity to obtain 3D measurements of patient position throughout IMRT and 3D treatments without excessive radiation exposure.  I am ordering optical surface tracking for this patient's upcoming course of radiotherapy. ________________________________  Kyung Rudd, MD 01/10/2017 7:55 PM    Reference:   Ursula Alert, J, et al. Surface imaging-based analysis of intrafraction motion for breast radiotherapy patients.Journal of [place], n. 6, nov. 2014. ISSN [id number].   Available at: <http://www.jacmp.org/index.php/jacmp/article/view/4957>.

## 2017-01-10 NOTE — Progress Notes (Signed)
  Radiation Oncology         [phone]) [phone] ________________________________  Name: LATIKA LEEZER MRN: [id number]  Date: 01/10/2017  DOB: 20-Mar-1976   DIAGNOSIS:     ICD-9-CM ICD-10-CM   1. Malignant neoplasm of upper-inner quadrant of left breast in female, estrogen receptor positive ([place]) 174.2 C50.212    V86.0 Z17.0     SIMULATION AND TREATMENT PLANNING NOTE  The patient presented for simulation prior to beginning her course of radiation treatment for her diagnosis of left-sided breast cancer. The patient was placed in a supine position on a breast board. A customized vac-lock bag was constructed and this complex treatment device will be used on a daily basis during her treatment. In this fashion, a CT scan was obtained through the chest area and an isocenter was placed near the chest wall within the breast.  The patient will be planned to receive a course of radiation initially to a dose of 42.5 Gy. This will consist of a whole breast radiotherapy technique. To accomplish this, 2 customized blocks have been designed which will correspond to medial and lateral whole breast tangent fields. This treatment will be accomplished at 2.5 Gy per fraction. A forward planning technique will also be evaluated to determine if this approach improves the plan. It is anticipated that the patient will then receive a 7.5 Gy boost to the seroma cavity which has been contoured. This will be accomplished at 2.5 Gy per fraction.   This initial treatment will consist of a 3-D conformal technique. The seroma has been contoured as the primary target structure. Additionally, dose volume histograms of both this target as well as the lungs and heart will also be evaluated. Such an approach is necessary to ensure that the target area is adequately covered while the nearby critical  normal structures are adequately spared.  Plan:  The final anticipated total dose therefore will correspond to 50 Gy.  Special treatment  procedure was performed today due to the extra time and effort required by myself to plan and prepare this patient for deep inspiration breath hold technique.  I have determined cardiac sparing to be of benefit to this patient to prevent long term cardiac damage due to radiation of the heart.  Bellows were placed on the patient's abdomen. To facilitate cardiac sparing, the patient was coached by the radiation therapists on breath hold techniques and breathing practice was performed. Practice waveforms were obtained. The patient was then scanned while maintaining breath hold in the treatment position.  This image was then transferred over to the imaging specialist. The imaging specialist then created a fusion of the free breathing and breath hold scans using the chest wall as the stable structure. I personally reviewed the fusion in axial, coronal and sagittal image planes.  Excellent cardiac sparing was obtained.  I felt the patient is an appropriate candidate for breath hold and the patient will be treated as such.  The image fusion was then reviewed with the patient to reinforce the necessity of reproducible breath hold.      _______________________________   Jodelle Gross, MD, PhD

## 2017-01-11 DIAGNOSIS — Z510 Encounter for antineoplastic radiation therapy: Secondary | ICD-10-CM | POA: Diagnosis not present

## 2017-01-17 ENCOUNTER — Ambulatory Visit
Admission: RE | Admit: 2017-01-17 | Discharge: 2017-01-17 | Disposition: A | Discharge status: Home / Self Care | Payer: Managed Care, Other (non HMO) | Source: Ambulatory Visit | Attending: Radiation Oncology | Admitting: Radiation Oncology

## 2017-01-17 DIAGNOSIS — Z510 Encounter for antineoplastic radiation therapy: Secondary | ICD-10-CM | POA: Diagnosis not present

## 2017-01-21 ENCOUNTER — Ambulatory Visit
Admission: RE | Admit: 2017-01-21 | Discharge: 2017-01-21 | Disposition: A | Discharge status: Home / Self Care | Payer: Managed Care, Other (non HMO) | Source: Ambulatory Visit | Attending: Radiation Oncology | Admitting: Radiation Oncology

## 2017-01-21 DIAGNOSIS — Z510 Encounter for antineoplastic radiation therapy: Secondary | ICD-10-CM | POA: Diagnosis not present

## 2017-01-21 DIAGNOSIS — C50212 Malignant neoplasm of upper-inner quadrant of left female breast: Secondary | ICD-10-CM

## 2017-01-21 MED ORDER — RADIAPLEXRX EX GEL
Freq: Once | CUTANEOUS | Status: AC
  Administered 2017-01-21: 12:00:00 via TOPICAL

## 2017-01-21 MED ORDER — ALRA NON-METALLIC DEODORANT (RAD-ONC)
1.0000 "application " | Freq: Once | TOPICAL | Status: AC
  Administered 2017-01-21: 1 via TOPICAL

## 2017-01-22 ENCOUNTER — Ambulatory Visit
Admission: RE | Admit: 2017-01-22 | Discharge: 2017-01-22 | Disposition: A | Discharge status: Home / Self Care | Payer: Managed Care, Other (non HMO) | Source: Ambulatory Visit | Attending: Radiation Oncology | Admitting: Radiation Oncology

## 2017-01-22 DIAGNOSIS — Z510 Encounter for antineoplastic radiation therapy: Secondary | ICD-10-CM | POA: Diagnosis not present

## 2017-01-23 ENCOUNTER — Ambulatory Visit
Admission: RE | Admit: 2017-01-23 | Discharge: 2017-01-23 | Disposition: A | Discharge status: Home / Self Care | Payer: Managed Care, Other (non HMO) | Source: Ambulatory Visit | Attending: Radiation Oncology | Admitting: Radiation Oncology

## 2017-01-23 DIAGNOSIS — Z510 Encounter for antineoplastic radiation therapy: Secondary | ICD-10-CM | POA: Diagnosis not present

## 2017-01-24 ENCOUNTER — Ambulatory Visit
Admission: RE | Admit: 2017-01-24 | Discharge: 2017-01-24 | Disposition: A | Discharge status: Home / Self Care | Payer: Managed Care, Other (non HMO) | Source: Ambulatory Visit | Attending: Radiation Oncology | Admitting: Radiation Oncology

## 2017-01-24 ENCOUNTER — Encounter: Payer: Self-pay | Admitting: Radiation Oncology

## 2017-01-24 DIAGNOSIS — Z510 Encounter for antineoplastic radiation therapy: Secondary | ICD-10-CM | POA: Diagnosis not present

## 2017-01-25 ENCOUNTER — Encounter: Payer: Self-pay | Admitting: Radiation Oncology

## 2017-01-25 ENCOUNTER — Ambulatory Visit
Admission: RE | Admit: 2017-01-25 | Discharge: 2017-01-25 | Disposition: A | Discharge status: Home / Self Care | Payer: Managed Care, Other (non HMO) | Source: Ambulatory Visit | Attending: Radiation Oncology | Admitting: Radiation Oncology

## 2017-01-25 VITALS — BP 121/76 | HR 74 | Temp 98.5°F | Resp 18 | Ht 64.0 in | Wt 168.0 lb

## 2017-01-25 DIAGNOSIS — Z510 Encounter for antineoplastic radiation therapy: Secondary | ICD-10-CM | POA: Diagnosis not present

## 2017-01-25 DIAGNOSIS — C50212 Malignant neoplasm of upper-inner quadrant of left female breast: Secondary | ICD-10-CM

## 2017-01-25 DIAGNOSIS — Z170 Estrogen receptor positive status [ER+]: Principal | ICD-10-CM

## 2017-01-25 NOTE — Progress Notes (Signed)
Donna Matthews has received 5/28 radiations treatments to left breast. Skin without change and intact, surgical site healing, using Radiaplex gel bid and deodorant daily.  Denies fatigue.  Appetite is good. Wt Readings from Last 3 Encounters:  01/25/17 168 lb (76.2 kg)  01/02/17 170 lb 6.4 oz (77.3 kg)  12/25/16 169 lb 14.4 oz (77.1 kg)  BP 121/76   Pulse 74   Temp 98.5 F (36.9 C) (Oral)   Resp 18   Ht 5\' 4"  (1.626 m)   Wt 168 lb (76.2 kg)   LMP 01/07/2017   SpO2 100%   BMI 28.84 kg/m

## 2017-01-26 ENCOUNTER — Encounter: Payer: Self-pay | Admitting: Radiation Oncology

## 2017-01-27 NOTE — Progress Notes (Signed)
Department of Radiation Oncology  Phone:  [phone] Fax:        [phone]  Weekly Treatment Note    Name: Donna Matthews Date: 01/27/2017 MRN: [id number] DOB: [birth year]   Diagnosis:     ICD-9-CM ICD-10-CM   1. Malignant neoplasm of upper-inner quadrant of left breast in female, estrogen receptor positive ([place]) 174.2 C50.212    V86.0 Z17.0      Current dose: 9 Gy  Current fraction: 5   MEDICATIONS: Current Outpatient Prescriptions  Medication Sig Dispense Refill  . acetaminophen (TYLENOL) 500 MG tablet Take 1,000 mg by mouth every 6 (six) hours as needed for mild pain.    . hyaluronate sodium (RADIAPLEXRX) GEL Apply 1 application topically 2 (two) times daily.    . IRON PO Take 1 tablet by mouth daily.     . Multiple Vitamin (MULTIVITAMIN) tablet Take 1 tablet by mouth daily.    . non-metallic deodorant Jethro Poling) MISC Apply 1 application topically daily as needed.    . nystatin-triamcinolone ointment (MYCOLOG) Apply 1 application topically 2 (two) times daily. 60 g 0  . oxyCODONE-acetaminophen (PERCOCET/ROXICET) 5-325 MG tablet Take 1 tablet by mouth.    Derrill Memo ON 03/10/2017] tamoxifen (NOLVADEX) 20 MG tablet Take 1 tablet (20 mg total) by mouth daily. (Patient not taking: Reported on 01/25/2017) 90 tablet 3  . Vitamin D, Ergocalciferol, (DRISDOL) 50000 units CAPS capsule Take 1 capsule (50,000 Units total) by mouth every 7 (seven) days. (Patient not taking: Reported on 01/25/2017) 12 capsule 0   No current facility-administered medications for this encounter.      ALLERGIES: Bee venom and Penicillins   LABORATORY DATA:  Lab Results  Component Value Date   WBC 5.6 12/13/2016   HGB 8.7 (L) 12/13/2016   HCT 27.7 (L) 12/13/2016   MCV 76.5 (L) 12/13/2016   PLT 303 12/13/2016   Lab Results  Component Value Date   NA 141 11/14/2016   K 3.9 11/14/2016   CL 107 10/12/2016   CO2 25 11/14/2016   Lab Results  Component Value Date   ALT 10 11/14/2016   AST 11  11/14/2016   ALKPHOS 64 11/14/2016   BILITOT 0.42 11/14/2016     NARRATIVE: Donna Matthews was seen today for weekly treatment management. The chart was checked and the patient's films were reviewed.  The patient is doing well in her first week of treatment. No unexpected difficulties.  [name] has received 5/28 radiations treatments to left breast. Skin without change and intact, surgical site healing, using Radiaplex gel bid and deodorant daily.  Denies fatigue.  Appetite is good. Wt Readings from Last 3 Encounters:  01/25/17 168 lb (76.2 kg)  01/02/17 170 lb 6.4 oz (77.3 kg)  12/25/16 169 lb 14.4 oz (77.1 kg)  BP 121/76   Pulse 74   Temp 98.5 F (36.9 C) (Oral)   Resp 18   Ht 5\' 4"  (1.626 m)   Wt 168 lb (76.2 kg)   LMP 01/07/2017   SpO2 100%   BMI 28.84 kg/m   PHYSICAL EXAMINATION: height is 5\' 4"  (1.626 m) and weight is 168 lb (76.2 kg). Her oral temperature is 98.5 F (36.9 C). Her blood pressure is 121/76 and her pulse is 74. Her respiration is 18 and oxygen saturation is 100%.        ASSESSMENT: The patient is doing satisfactorily with treatment.  PLAN: We will continue with the patient's radiation treatment as planned.

## 2017-01-28 ENCOUNTER — Ambulatory Visit
Admission: RE | Admit: 2017-01-28 | Discharge: 2017-01-28 | Disposition: A | Discharge status: Home / Self Care | Payer: Managed Care, Other (non HMO) | Source: Ambulatory Visit | Attending: Radiation Oncology | Admitting: Radiation Oncology

## 2017-01-28 DIAGNOSIS — Z510 Encounter for antineoplastic radiation therapy: Secondary | ICD-10-CM | POA: Diagnosis not present

## 2017-01-29 ENCOUNTER — Ambulatory Visit
Admission: RE | Admit: 2017-01-29 | Discharge: 2017-01-29 | Disposition: A | Discharge status: Home / Self Care | Payer: Managed Care, Other (non HMO) | Source: Ambulatory Visit | Attending: Radiation Oncology | Admitting: Radiation Oncology

## 2017-01-29 DIAGNOSIS — Z510 Encounter for antineoplastic radiation therapy: Secondary | ICD-10-CM | POA: Diagnosis not present

## 2017-01-30 ENCOUNTER — Ambulatory Visit
Admission: RE | Admit: 2017-01-30 | Discharge: 2017-01-30 | Disposition: A | Discharge status: Home / Self Care | Payer: Managed Care, Other (non HMO) | Source: Ambulatory Visit | Attending: Radiation Oncology | Admitting: Radiation Oncology

## 2017-01-30 DIAGNOSIS — Z510 Encounter for antineoplastic radiation therapy: Secondary | ICD-10-CM | POA: Diagnosis not present

## 2017-01-31 ENCOUNTER — Ambulatory Visit
Admission: RE | Admit: 2017-01-31 | Discharge: 2017-01-31 | Disposition: A | Discharge status: Home / Self Care | Payer: Managed Care, Other (non HMO) | Source: Ambulatory Visit | Attending: Radiation Oncology | Admitting: Radiation Oncology

## 2017-01-31 DIAGNOSIS — Z510 Encounter for antineoplastic radiation therapy: Secondary | ICD-10-CM | POA: Diagnosis not present

## 2017-02-01 ENCOUNTER — Encounter: Payer: Self-pay | Admitting: Radiation Oncology

## 2017-02-01 ENCOUNTER — Ambulatory Visit
Admission: RE | Admit: 2017-02-01 | Discharge: 2017-02-01 | Disposition: A | Discharge status: Home / Self Care | Payer: Managed Care, Other (non HMO) | Source: Ambulatory Visit | Attending: Radiation Oncology | Admitting: Radiation Oncology

## 2017-02-01 VITALS — BP 111/68 | HR 66 | Temp 98.3°F | Resp 20 | Wt 169.6 lb

## 2017-02-01 DIAGNOSIS — Z170 Estrogen receptor positive status [ER+]: Principal | ICD-10-CM

## 2017-02-01 DIAGNOSIS — C50212 Malignant neoplasm of upper-inner quadrant of left female breast: Secondary | ICD-10-CM

## 2017-02-01 DIAGNOSIS — Z510 Encounter for antineoplastic radiation therapy: Secondary | ICD-10-CM | POA: Diagnosis not present

## 2017-02-01 NOTE — Progress Notes (Signed)
Weekly rad tx sleft breast, mild erythmea, slight swelling, skin intact, no pain, uses radiaplex bid, appetite good 8:48 AM BP 111/68 (BP Location: Right Arm, Patient Position: Sitting, Cuff Size: Normal)   Pulse 66   Temp 98.3 F (36.8 C) (Oral)   Resp 20   Wt 169 lb 9.6 oz (76.9 kg)   LMP 01/07/2017   BMI 29.11 kg/m   Wt Readings from Last 3 Encounters:  02/01/17 169 lb 9.6 oz (76.9 kg)  01/25/17 168 lb (76.2 kg)  01/02/17 170 lb 6.4 oz (77.3 kg)

## 2017-02-03 NOTE — Progress Notes (Signed)
Department of Radiation Oncology  Phone:  [phone] Fax:        [phone]  Weekly Treatment Note    Name: Donna Matthews Date: 02/03/2017 MRN: [id number] DOB: 1976/09/16   Diagnosis:     ICD-9-CM ICD-10-CM   1. Malignant neoplasm of upper-inner quadrant of left breast in female, estrogen receptor positive ([place]) 174.2 C50.212    V86.0 Z17.0      Current dose: 18 Gy  Current fraction: 10   MEDICATIONS: Current Outpatient Prescriptions  Medication Sig Dispense Refill  . acetaminophen (TYLENOL) 500 MG tablet Take 1,000 mg by mouth every 6 (six) hours as needed for mild pain.    . hyaluronate sodium (RADIAPLEXRX) GEL Apply 1 application topically 2 (two) times daily.    . IRON PO Take 1 tablet by mouth daily.     . Multiple Vitamin (MULTIVITAMIN) tablet Take 1 tablet by mouth daily.    . naproxen sodium (ANAPROX) 220 MG tablet Take 220 mg by mouth as needed.    . non-metallic deodorant Jethro Poling) MISC Apply 1 application topically daily as needed.    . nystatin-triamcinolone ointment (MYCOLOG) Apply 1 application topically 2 (two) times daily. 60 g 0  . oxyCODONE-acetaminophen (PERCOCET/ROXICET) 5-325 MG tablet Take 1 tablet by mouth.    Derrill Memo ON 03/10/2017] tamoxifen (NOLVADEX) 20 MG tablet Take 1 tablet (20 mg total) by mouth daily. (Patient not taking: Reported on 01/25/2017) 90 tablet 3  . Vitamin D, Ergocalciferol, (DRISDOL) 50000 units CAPS capsule Take 1 capsule (50,000 Units total) by mouth every 7 (seven) days. (Patient not taking: Reported on 01/25/2017) 12 capsule 0   No current facility-administered medications for this encounter.      ALLERGIES: Bee venom and Penicillins   LABORATORY DATA:  Lab Results  Component Value Date   WBC 5.6 12/13/2016   HGB 8.7 (L) 12/13/2016   HCT 27.7 (L) 12/13/2016   MCV 76.5 (L) 12/13/2016   PLT 303 12/13/2016   Lab Results  Component Value Date   NA 141 11/14/2016   K 3.9 11/14/2016   CL 107 10/12/2016   CO2 25  11/14/2016   Lab Results  Component Value Date   ALT 10 11/14/2016   AST 11 11/14/2016   ALKPHOS 64 11/14/2016   BILITOT 0.42 11/14/2016     NARRATIVE: Donna Matthews was seen today for weekly treatment management. The chart was checked and the patient's films were reviewed.  The patient is doing very well after completing her second week of treatment. No unexpected difficulties.  Weekly rad tx sleft breast, mild erythmea, slight swelling, skin intact, no pain, uses radiaplex bid, appetite good 2:55 PM BP 111/68 (BP Location: Right Arm, Patient Position: Sitting, Cuff Size: Normal)   Pulse 66   Temp 98.3 F (36.8 C) (Oral)   Resp 20   Wt 169 lb 9.6 oz (76.9 kg)   LMP 01/07/2017   BMI 29.11 kg/m   Wt Readings from Last 3 Encounters:  02/01/17 169 lb 9.6 oz (76.9 kg)  01/25/17 168 lb (76.2 kg)  01/02/17 170 lb 6.4 oz (77.3 kg)    PHYSICAL EXAMINATION: weight is 169 lb 9.6 oz (76.9 kg). Her oral temperature is 98.3 F (36.8 C). Her blood pressure is 111/68 and her pulse is 66. Her respiration is 20.        ASSESSMENT: The patient is doing satisfactorily with treatment.  PLAN: We will continue with the patient's radiation treatment as planned.

## 2017-02-03 NOTE — Addendum Note (Signed)
Encounter addended by: Kyung Rudd, MD on: 02/03/2017  2:56 PM<BR>    Actions taken: Sign clinical note

## 2017-02-04 ENCOUNTER — Ambulatory Visit
Admission: RE | Admit: 2017-02-04 | Discharge: 2017-02-04 | Disposition: A | Discharge status: Home / Self Care | Payer: Managed Care, Other (non HMO) | Source: Ambulatory Visit | Attending: Radiation Oncology | Admitting: Radiation Oncology

## 2017-02-04 ENCOUNTER — Encounter: Payer: Self-pay | Admitting: Radiation Oncology

## 2017-02-04 DIAGNOSIS — Z510 Encounter for antineoplastic radiation therapy: Secondary | ICD-10-CM | POA: Diagnosis not present

## 2017-02-05 ENCOUNTER — Ambulatory Visit
Admission: RE | Admit: 2017-02-05 | Discharge: 2017-02-05 | Disposition: A | Discharge status: Home / Self Care | Payer: Managed Care, Other (non HMO) | Source: Ambulatory Visit | Attending: Radiation Oncology | Admitting: Radiation Oncology

## 2017-02-05 DIAGNOSIS — Z510 Encounter for antineoplastic radiation therapy: Secondary | ICD-10-CM | POA: Diagnosis not present

## 2017-02-06 ENCOUNTER — Ambulatory Visit
Admission: RE | Admit: 2017-02-06 | Discharge: 2017-02-06 | Disposition: A | Discharge status: Home / Self Care | Payer: Managed Care, Other (non HMO) | Source: Ambulatory Visit | Attending: Radiation Oncology | Admitting: Radiation Oncology

## 2017-02-06 DIAGNOSIS — Z510 Encounter for antineoplastic radiation therapy: Secondary | ICD-10-CM | POA: Diagnosis not present

## 2017-02-07 ENCOUNTER — Ambulatory Visit
Admission: RE | Admit: 2017-02-07 | Discharge: 2017-02-07 | Disposition: A | Discharge status: Home / Self Care | Payer: Managed Care, Other (non HMO) | Source: Ambulatory Visit | Attending: Radiation Oncology | Admitting: Radiation Oncology

## 2017-02-07 DIAGNOSIS — Z510 Encounter for antineoplastic radiation therapy: Secondary | ICD-10-CM | POA: Diagnosis not present

## 2017-02-08 ENCOUNTER — Ambulatory Visit
Admission: RE | Admit: 2017-02-08 | Discharge: 2017-02-08 | Disposition: A | Discharge status: Home / Self Care | Payer: Managed Care, Other (non HMO) | Source: Ambulatory Visit | Attending: Radiation Oncology | Admitting: Radiation Oncology

## 2017-02-08 ENCOUNTER — Encounter: Payer: Self-pay | Admitting: Radiation Oncology

## 2017-02-08 VITALS — BP 119/71 | HR 76 | Temp 98.4°F | Resp 20 | Wt 172.2 lb

## 2017-02-08 DIAGNOSIS — Z510 Encounter for antineoplastic radiation therapy: Secondary | ICD-10-CM | POA: Diagnosis not present

## 2017-02-08 DIAGNOSIS — C50212 Malignant neoplasm of upper-inner quadrant of left female breast: Secondary | ICD-10-CM

## 2017-02-08 DIAGNOSIS — Z170 Estrogen receptor positive status [ER+]: Principal | ICD-10-CM

## 2017-02-08 NOTE — Progress Notes (Signed)
Weekly rad txs left breast, mild discoloration,tanning, skin intact, using radiaplex bid, has mild headaches stated, sharp pains occasionally throughout breast BP 119/71 (BP Location: Left Arm, Patient Position: Sitting, Cuff Size: Normal)   Pulse 76   Temp 98.4 F (36.9 C) (Oral)   Resp 20   Wt 172 lb 3.2 oz (78.1 kg)   BMI 29.56 kg/m   Wt Readings from Last 3 Encounters:  02/08/17 172 lb 3.2 oz (78.1 kg)  02/01/17 169 lb 9.6 oz (76.9 kg)  01/25/17 168 lb (76.2 kg)

## 2017-02-08 NOTE — Progress Notes (Signed)
Department of Radiation Oncology  Phone:  [phone] Fax:        [phone]  Weekly Treatment Note    Name: Donna Matthews Date: 02/08/2017 MRN: [id number] DOB: 12/07/1976   Diagnosis:     ICD-9-CM ICD-10-CM   1. Malignant neoplasm of upper-inner quadrant of left breast in female, estrogen receptor positive ([place]) 174.2 C50.212    V86.0 Z17.0      Current dose: 27 Gy  Current fraction: 15   MEDICATIONS: Current Outpatient Prescriptions  Medication Sig Dispense Refill  . acetaminophen (TYLENOL) 500 MG tablet Take 1,000 mg by mouth every 6 (six) hours as needed for mild pain.    . hyaluronate sodium (RADIAPLEXRX) GEL Apply 1 application topically 2 (two) times daily.    . IRON PO Take 1 tablet by mouth daily.     . Multiple Vitamin (MULTIVITAMIN) tablet Take 1 tablet by mouth daily.    . naproxen sodium (ANAPROX) 220 MG tablet Take 220 mg by mouth as needed.    . non-metallic deodorant Jethro Poling) MISC Apply 1 application topically daily as needed.    . nystatin-triamcinolone ointment (MYCOLOG) Apply 1 application topically 2 (two) times daily. 60 g 0  . oxyCODONE-acetaminophen (PERCOCET/ROXICET) 5-325 MG tablet Take 1 tablet by mouth.    Derrill Memo ON 03/10/2017] tamoxifen (NOLVADEX) 20 MG tablet Take 1 tablet (20 mg total) by mouth daily. (Patient not taking: Reported on 01/25/2017) 90 tablet 3  . Vitamin D, Ergocalciferol, (DRISDOL) 50000 units CAPS capsule Take 1 capsule (50,000 Units total) by mouth every 7 (seven) days. (Patient not taking: Reported on 01/25/2017) 12 capsule 0   No current facility-administered medications for this encounter.      ALLERGIES: Bee venom and Penicillins   LABORATORY DATA:  Lab Results  Component Value Date   WBC 5.6 12/13/2016   HGB 8.7 (L) 12/13/2016   HCT 27.7 (L) 12/13/2016   MCV 76.5 (L) 12/13/2016   PLT 303 12/13/2016   Lab Results  Component Value Date   NA 141 11/14/2016   K 3.9 11/14/2016   CL 107 10/12/2016   CO2 25  11/14/2016   Lab Results  Component Value Date   ALT 10 11/14/2016   AST 11 11/14/2016   ALKPHOS 64 11/14/2016   BILITOT 0.42 11/14/2016     NARRATIVE: Donna Matthews was seen today for weekly treatment management. The chart was checked and the patient's films were reviewed.The patient states that she continues to do very well. No major difficulties in terms of skin irritation.    Weekly rad txs left breast, mild discoloration,tanning, skin intact, using radiaplex bid, has mild headaches stated, sharp pains occasionally throughout breast BP 119/71 (BP Location: Left Arm, Patient Position: Sitting, Cuff Size: Normal)   Pulse 76   Temp 98.4 F (36.9 C) (Oral)   Resp 20   Wt 172 lb 3.2 oz (78.1 kg)   BMI 29.56 kg/m   Wt Readings from Last 3 Encounters:  02/08/17 172 lb 3.2 oz (78.1 kg)  02/01/17 169 lb 9.6 oz (76.9 kg)  01/25/17 168 lb (76.2 kg)    PHYSICAL EXAMINATION: weight is 172 lb 3.2 oz (78.1 kg). Her oral temperature is 98.4 F (36.9 C). Her blood pressure is 119/71 and her pulse is 76. Her respiration is 20.      hyperpigmentation present.   ASSESSMENT: The patient is doing satisfactorily with treatment.  PLAN: We will continue with the patient's radiation treatment as planned.

## 2017-02-11 ENCOUNTER — Ambulatory Visit
Admission: RE | Admit: 2017-02-11 | Discharge: 2017-02-11 | Disposition: A | Discharge status: Home / Self Care | Payer: Managed Care, Other (non HMO) | Source: Ambulatory Visit | Attending: Radiation Oncology | Admitting: Radiation Oncology

## 2017-02-11 DIAGNOSIS — Z510 Encounter for antineoplastic radiation therapy: Secondary | ICD-10-CM | POA: Diagnosis not present

## 2017-02-12 ENCOUNTER — Ambulatory Visit
Admission: RE | Admit: 2017-02-12 | Discharge: 2017-02-12 | Disposition: A | Discharge status: Home / Self Care | Payer: Managed Care, Other (non HMO) | Source: Ambulatory Visit | Attending: Radiation Oncology | Admitting: Radiation Oncology

## 2017-02-12 DIAGNOSIS — Z510 Encounter for antineoplastic radiation therapy: Secondary | ICD-10-CM | POA: Diagnosis not present

## 2017-02-13 ENCOUNTER — Ambulatory Visit
Admission: RE | Admit: 2017-02-13 | Discharge: 2017-02-13 | Disposition: A | Discharge status: Home / Self Care | Payer: Managed Care, Other (non HMO) | Source: Ambulatory Visit | Attending: Radiation Oncology | Admitting: Radiation Oncology

## 2017-02-13 ENCOUNTER — Encounter: Payer: Self-pay | Admitting: Radiation Oncology

## 2017-02-13 DIAGNOSIS — Z510 Encounter for antineoplastic radiation therapy: Secondary | ICD-10-CM | POA: Diagnosis not present

## 2017-02-14 ENCOUNTER — Ambulatory Visit
Admission: RE | Admit: 2017-02-14 | Discharge: 2017-02-14 | Disposition: A | Discharge status: Home / Self Care | Payer: Managed Care, Other (non HMO) | Source: Ambulatory Visit | Attending: Radiation Oncology | Admitting: Radiation Oncology

## 2017-02-14 DIAGNOSIS — C50212 Malignant neoplasm of upper-inner quadrant of left female breast: Secondary | ICD-10-CM

## 2017-02-14 DIAGNOSIS — Z170 Estrogen receptor positive status [ER+]: Principal | ICD-10-CM

## 2017-02-14 DIAGNOSIS — Z510 Encounter for antineoplastic radiation therapy: Secondary | ICD-10-CM | POA: Diagnosis not present

## 2017-02-15 ENCOUNTER — Ambulatory Visit
Admission: RE | Admit: 2017-02-15 | Discharge: 2017-02-15 | Disposition: A | Discharge status: Home / Self Care | Payer: Managed Care, Other (non HMO) | Source: Ambulatory Visit | Attending: Radiation Oncology | Admitting: Radiation Oncology

## 2017-02-15 ENCOUNTER — Encounter: Payer: Self-pay | Admitting: Radiation Oncology

## 2017-02-15 DIAGNOSIS — Z510 Encounter for antineoplastic radiation therapy: Secondary | ICD-10-CM | POA: Diagnosis not present

## 2017-02-18 ENCOUNTER — Ambulatory Visit
Admission: RE | Admit: 2017-02-18 | Discharge: 2017-02-18 | Disposition: A | Discharge status: Home / Self Care | Payer: Managed Care, Other (non HMO) | Source: Ambulatory Visit | Attending: Radiation Oncology | Admitting: Radiation Oncology

## 2017-02-18 DIAGNOSIS — Z510 Encounter for antineoplastic radiation therapy: Secondary | ICD-10-CM | POA: Diagnosis not present

## 2017-02-19 ENCOUNTER — Ambulatory Visit
Admission: RE | Admit: 2017-02-19 | Discharge: 2017-02-19 | Disposition: A | Discharge status: Home / Self Care | Payer: Managed Care, Other (non HMO) | Source: Ambulatory Visit | Attending: Radiation Oncology | Admitting: Radiation Oncology

## 2017-02-19 DIAGNOSIS — Z510 Encounter for antineoplastic radiation therapy: Secondary | ICD-10-CM | POA: Diagnosis not present

## 2017-02-20 ENCOUNTER — Ambulatory Visit
Admission: RE | Admit: 2017-02-20 | Discharge: 2017-02-20 | Disposition: A | Discharge status: Home / Self Care | Payer: Managed Care, Other (non HMO) | Source: Ambulatory Visit | Attending: Radiation Oncology | Admitting: Radiation Oncology

## 2017-02-20 DIAGNOSIS — Z510 Encounter for antineoplastic radiation therapy: Secondary | ICD-10-CM | POA: Diagnosis not present

## 2017-02-21 ENCOUNTER — Ambulatory Visit
Admission: RE | Admit: 2017-02-21 | Discharge: 2017-02-21 | Disposition: A | Discharge status: Home / Self Care | Payer: Managed Care, Other (non HMO) | Source: Ambulatory Visit | Attending: Radiation Oncology | Admitting: Radiation Oncology

## 2017-02-21 DIAGNOSIS — Z510 Encounter for antineoplastic radiation therapy: Secondary | ICD-10-CM | POA: Diagnosis not present

## 2017-02-22 ENCOUNTER — Ambulatory Visit
Admission: RE | Admit: 2017-02-22 | Discharge: 2017-02-22 | Disposition: A | Discharge status: Home / Self Care | Payer: Managed Care, Other (non HMO) | Source: Ambulatory Visit | Attending: Radiation Oncology | Admitting: Radiation Oncology

## 2017-02-22 DIAGNOSIS — Z510 Encounter for antineoplastic radiation therapy: Secondary | ICD-10-CM | POA: Diagnosis not present

## 2017-02-22 NOTE — Progress Notes (Signed)
Complex simulation note  Diagnosis: left-sided breast cancer  Narrative The patient has initially been planned to receive a course of whole breast radiation to a dose of 42.5 Gy in 17 fractions. The patient will now receive an additional boost to the seroma cavity which has been contoured. This will correspond to a boost of 7.5 Gy at 2.5 Gy per fraction. To accomplish this, an additional 2 customized blocks have been designed for this purpose. A complex isodose plan is requested to ensure that the target area is adequately covered with radiation dose and that the nearby normal structures such as the lung are adequately spared. The patient's final total dose will be 50 Gy.  ------------------------------------------------  Jodelle Gross, MD, PhD

## 2017-02-25 ENCOUNTER — Ambulatory Visit
Admission: RE | Admit: 2017-02-25 | Discharge: 2017-02-25 | Disposition: A | Discharge status: Home / Self Care | Payer: Managed Care, Other (non HMO) | Source: Ambulatory Visit | Attending: Radiation Oncology | Admitting: Radiation Oncology

## 2017-02-25 DIAGNOSIS — Z510 Encounter for antineoplastic radiation therapy: Secondary | ICD-10-CM | POA: Diagnosis not present

## 2017-02-26 ENCOUNTER — Ambulatory Visit
Admission: RE | Admit: 2017-02-26 | Discharge: 2017-02-26 | Disposition: A | Discharge status: Home / Self Care | Payer: Managed Care, Other (non HMO) | Source: Ambulatory Visit | Attending: Radiation Oncology | Admitting: Radiation Oncology

## 2017-02-26 DIAGNOSIS — Z510 Encounter for antineoplastic radiation therapy: Secondary | ICD-10-CM | POA: Diagnosis not present

## 2017-02-27 ENCOUNTER — Ambulatory Visit
Admission: RE | Admit: 2017-02-27 | Discharge: 2017-02-27 | Disposition: A | Discharge status: Home / Self Care | Payer: Managed Care, Other (non HMO) | Source: Ambulatory Visit | Attending: Radiation Oncology | Admitting: Radiation Oncology

## 2017-02-27 DIAGNOSIS — Z510 Encounter for antineoplastic radiation therapy: Secondary | ICD-10-CM | POA: Diagnosis not present

## 2017-02-28 ENCOUNTER — Ambulatory Visit
Admission: RE | Admit: 2017-02-28 | Discharge: 2017-02-28 | Disposition: A | Discharge status: Home / Self Care | Payer: Managed Care, Other (non HMO) | Source: Ambulatory Visit | Attending: Radiation Oncology | Admitting: Radiation Oncology

## 2017-02-28 DIAGNOSIS — Z510 Encounter for antineoplastic radiation therapy: Secondary | ICD-10-CM | POA: Diagnosis not present

## 2017-03-01 ENCOUNTER — Ambulatory Visit
Admission: RE | Admit: 2017-03-01 | Discharge: 2017-03-01 | Disposition: A | Discharge status: Home / Self Care | Payer: Managed Care, Other (non HMO) | Source: Ambulatory Visit | Attending: Radiation Oncology | Admitting: Radiation Oncology

## 2017-03-01 DIAGNOSIS — Z510 Encounter for antineoplastic radiation therapy: Secondary | ICD-10-CM | POA: Diagnosis not present

## 2017-03-04 ENCOUNTER — Ambulatory Visit
Admission: RE | Admit: 2017-03-04 | Discharge: 2017-03-04 | Disposition: A | Discharge status: Home / Self Care | Payer: Managed Care, Other (non HMO) | Source: Ambulatory Visit | Attending: Radiation Oncology | Admitting: Radiation Oncology

## 2017-03-04 DIAGNOSIS — Z510 Encounter for antineoplastic radiation therapy: Secondary | ICD-10-CM | POA: Diagnosis not present

## 2017-03-05 ENCOUNTER — Ambulatory Visit
Admission: RE | Admit: 2017-03-05 | Discharge: 2017-03-05 | Disposition: A | Discharge status: Home / Self Care | Payer: Managed Care, Other (non HMO) | Source: Ambulatory Visit | Attending: Radiation Oncology | Admitting: Radiation Oncology

## 2017-03-05 DIAGNOSIS — Z510 Encounter for antineoplastic radiation therapy: Secondary | ICD-10-CM | POA: Diagnosis not present

## 2017-03-06 ENCOUNTER — Ambulatory Visit
Admission: RE | Admit: 2017-03-06 | Discharge: 2017-03-06 | Disposition: A | Discharge status: Home / Self Care | Payer: Managed Care, Other (non HMO) | Source: Ambulatory Visit | Attending: Radiation Oncology | Admitting: Radiation Oncology

## 2017-03-06 ENCOUNTER — Encounter: Payer: Self-pay | Admitting: Radiation Oncology

## 2017-03-06 ENCOUNTER — Telehealth: Payer: Self-pay | Admitting: *Deleted

## 2017-03-06 DIAGNOSIS — Z510 Encounter for antineoplastic radiation therapy: Secondary | ICD-10-CM | POA: Diagnosis not present

## 2017-03-06 NOTE — Telephone Encounter (Signed)
  Oncology Nurse Navigator Documentation  Navigator Location: [facility] (03/06/17 1400)   )Navigator Encounter Type: Telephone (03/06/17 1400) Telephone: [facility] (03/06/17 1400)     Surgery Date: 12/18/16 (03/06/17 1400)             Patient Visit Type: TX[facility]FS[facility] (03/06/17 1400) Treatment Phase: Final Radiation Tx (03/06/17 1400)     Interventions: Referrals (03/06/17 1400) Referrals: Survivorship (03/06/17 1400)                    Time Spent with Patient: 15 (03/06/17 1400)

## 2017-03-06 NOTE — Progress Notes (Signed)
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 2) Left Breast Boost: 10 Gy in 5 fractions.  Beams/energy:    1) 3D // 10X, 6X Photon 2) Isodose Plan // 10X Photon  Narrative: The patient tolerated radiation treatment relatively well. The patient developed hyperpigmenation of the left breast with mild to moderate dry desquamation.  Plan: The patient has completed radiation treatment. The patient will return to radiation oncology clinic for routine followup in one month. I advised them to call or return sooner if they have any questions or concerns related to their recovery or treatment.  ------------------------------------------------  Donna Gross, MD, PhD  This document serves as a record of services personally performed by Kyung Rudd, MD. It was created on his behalf by Darcus Austin, a trained medical scribe. The creation of this record is based on the scribe's personal observations and the provider's statements to them. This document has been checked and approved by the attending provider.

## 2017-04-09 ENCOUNTER — Encounter: Payer: Self-pay | Admitting: Hematology and Oncology

## 2017-04-09 ENCOUNTER — Ambulatory Visit (HOSPITAL_BASED_OUTPATIENT_CLINIC_OR_DEPARTMENT_OTHER): Payer: Managed Care, Other (non HMO) | Admitting: Hematology and Oncology

## 2017-04-09 DIAGNOSIS — C50212 Malignant neoplasm of upper-inner quadrant of left female breast: Secondary | ICD-10-CM

## 2017-04-09 DIAGNOSIS — Z923 Personal history of irradiation: Secondary | ICD-10-CM | POA: Diagnosis not present

## 2017-04-09 DIAGNOSIS — Z170 Estrogen receptor positive status [ER+]: Secondary | ICD-10-CM

## 2017-04-09 DIAGNOSIS — Z79811 Long term (current) use of aromatase inhibitors: Secondary | ICD-10-CM | POA: Diagnosis not present

## 2017-04-09 MED ORDER — TAMOXIFEN CITRATE 20 MG PO TABS: 20.0000 mg | ORAL_TABLET | Freq: Every day | ORAL | 3 refills | Status: DC

## 2017-04-09 MED ORDER — BIOTIN 1 MG PO CAPS: 1.0000 mg | ORAL_CAPSULE | Freq: Every day | ORAL | Status: AC

## 2017-04-09 NOTE — Assessment & Plan Note (Signed)
12/18/16: Left Lumpectomy: HG DCIS 1.2 cm, Margins Neg, 0/7 LN Neg; TisN0 (Stage 1 A) ER 95%, PR 95% Adjuvant radiation therapy 01/21/2017- 03/06/2017  Current treatment: Antiestrogen therapy with tamoxifen 5 years, started 03/10/2017  Tamoxifen toxicities:  RTC in 6 months for follow-up

## 2017-04-09 NOTE — Progress Notes (Signed)
Patient Care Team: [name], PA as PCP - General (Family Medicine) [name], MD as Consulting Physician (General Surgery) Nicholas Lose, MD as Consulting Physician (Hematology and Oncology) Kyung Rudd, MD as Consulting Physician (Radiation Oncology)  DIAGNOSIS:  Encounter Diagnosis  Name Primary?  . 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  The Hereditary Gene Panel offered by [facility] includes sequencing and/or deletion duplication testing of the following 43 genes: APC, ATM, AXIN2, BARD1, BMPR1A, BRCA1, BRCA2, BRIP1, CDH1, CDKN2A (p14ARF), CDKN2A (p16INK4a), CHEK2, DICER1, EPCAM (Deletion/duplication testing only), GREM1 (promoter region deletion/duplication testing only), KIT, MEN1, MLH1, MSH2, MSH6, MUTYH, NBN, NF1, PALB2, PDGFRA, PMS2, POLD1, POLE, PTEN, RAD50, RAD51C, RAD51D, SDHB, SDHC, SDHD, SMAD4, SMARCA4. STK11, TP53, TSC1, TSC2, and VHL.  The following gene was evaluated for sequence changes only: SDHA and HOXB13 c.251G>A.  The report date is December 02, 2016.      12/18/2016 Surgery    Left Lumpectomy: HG DCIS 1.2 cm, Margins Neg, 0/7 LN Neg; TisN0 (Stage 1 A)      01/21/2017 - 03/06/2017 Radiation Therapy    Adjuvant radiation therapy      04/09/2017 -  Anti-estrogen oral therapy    Tamoxifen 20 mg daily       CHIEF COMPLIANT: Follow-up after radiation therapy  INTERVAL HISTORY: Donna Matthews is a 41 year old with above-mentioned history of left breast DCIS who  underwent lumpectomy followed by radiation and is currently here to discuss starting antiestrogen therapy with tamoxifen. She has recovered very well from the effects of radiation. Does not have any fatigue symptoms. Denies any pain in the breast  REVIEW OF SYSTEMS:   Constitutional: Denies fevers, chills or abnormal weight loss Eyes: Denies blurriness of vision Ears, nose, mouth, throat, and face: Denies mucositis or sore throat Respiratory: Denies cough, dyspnea or wheezes Cardiovascular: Denies palpitation, chest discomfort Gastrointestinal:  Denies nausea, heartburn or change in bowel habits Skin: Denies abnormal skin rashes Lymphatics: Denies new lymphadenopathy or easy bruising Neurological:Denies numbness, tingling or new weaknesses Behavioral/Psych: Mood is stable, no new changes  Extremities: No lower extremity edema Breast:  denies any pain or lumps or nodules in either breasts All other systems were reviewed with the patient and are negative.  I have reviewed the past medical history, past surgical history, social history and family history with the patient and they are unchanged from previous note.  ALLERGIES:  is allergic to bee venom and penicillins.  MEDICATIONS:  Current Outpatient Prescriptions  Medication Sig Dispense Refill  . acetaminophen (TYLENOL) 500 MG tablet Take 1,000 mg by mouth every 6 (six) hours as needed for mild pain.    . Biotin 1 MG CAPS Take 1 mg by mouth daily.    . IRON PO Take 1 tablet by mouth daily.     . Multiple Vitamin (MULTIVITAMIN) tablet Take 1 tablet by mouth daily.    . naproxen sodium (ANAPROX) 220 MG tablet Take 220 mg by mouth as needed.    . tamoxifen (NOLVADEX)  20 MG tablet Take 1 tablet (20 mg total) by mouth daily. 90 tablet 3   No current facility-administered medications for this visit.     PHYSICAL EXAMINATION: ECOG PERFORMANCE STATUS: 1 - Symptomatic but completely ambulatory  Vitals:   04/09/17 0842  BP: 114/64    Pulse: 80  Resp: 18  Temp: 98.6 F (37 C)   Filed Weights   04/09/17 0842  Weight: 170 lb 9.6 oz (77.4 kg)    GENERAL:alert, no distress and comfortable SKIN: skin color, texture, turgor are normal, no rashes or significant lesions EYES: normal, Conjunctiva are pink and non-injected, sclera clear OROPHARYNX:no exudate, no erythema and lips, buccal mucosa, and tongue normal  NECK: supple, thyroid normal size, non-tender, without nodularity LYMPH:  no palpable lymphadenopathy in the cervical, axillary or inguinal LUNGS: clear to auscultation and percussion with normal breathing effort HEART: regular rate & rhythm and no murmurs and no lower extremity edema ABDOMEN:abdomen soft, non-tender and normal bowel sounds MUSCULOSKELETAL:no cyanosis of digits and no clubbing  NEURO: alert & oriented x 3 with fluent speech, no focal motor/sensory deficits EXTREMITIES: No lower extremity edema BREAST: No palpable masses or nodules in either right or left breasts. No palpable axillary supraclavicular or infraclavicular adenopathy no breast tenderness or nipple discharge. (exam performed in the presence of a chaperone)  LABORATORY DATA:  I have reviewed the data as listed   Chemistry      Component Value Date/Time   NA 141 11/14/2016 1221   K 3.9 11/14/2016 1221   CL 107 10/12/2016 1009   CO2 25 11/14/2016 1221   BUN 8.3 11/14/2016 1221   CREATININE 0.7 11/14/2016 1221      Component Value Date/Time   CALCIUM 8.9 11/14/2016 1221   ALKPHOS 64 11/14/2016 1221   AST 11 11/14/2016 1221   ALT 10 11/14/2016 1221   BILITOT 0.42 11/14/2016 1221       Lab Results  Component Value Date   WBC 5.6 12/13/2016   HGB 8.7 (L) 12/13/2016   HCT 27.7 (L) 12/13/2016   MCV 76.5 (L) 12/13/2016   PLT 303 12/13/2016   NEUTROABS 2.4 11/14/2016    ASSESSMENT & PLAN:  Malignant neoplasm of upper-inner quadrant of left female breast ([place]) 12/18/16: Left Lumpectomy: HG DCIS 1.2 cm, Margins Neg, 0/7 LN  Neg; TisN0 (Stage 1 A) ER 95%, PR 95% Adjuvant radiation therapy 01/21/2017- 03/06/2017  Current treatment: Antiestrogen therapy with tamoxifen 5 years, started 03/10/2017  Tamoxifen Counseling: We discussed the risks and benefits of tamoxifen. These include but not limited to insomnia, hot flashes, mood changes, vaginal dryness, and weight gain. Although rare, serious side effects including endometrial cancer, risk of blood clots were also discussed. We strongly believe that the benefits far outweigh the risks. Patient understands these risks and consented to starting treatment. Planned treatment duration is 5 years.  RTC in 3 months for follow-up  I spent 25 minutes talking to the patient of which more than half was spent in counseling and coordination of care.  No orders of the defined types were placed in this encounter.  The patient has a good understanding of the overall plan. she agrees with it. she will call with any problems that may develop before the next visit here.   Rulon Eisenmenger, MD 04/09/17

## 2017-04-15 NOTE — Progress Notes (Signed)
Donna Matthews. Donna Matthews 41 y.o. 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  Skin status:Left breast with normal color mild hyperpigmentation.  Has mild tenderness.  Has mild edema that has been present since surgery. What lotion are you using? Using lotion with vitamin E Have you seen med onc? If not, when is appointment:04-09-17 Dr. Lindi Adie  Antiestrogen therapy with tamoxifen 5 years, started 03/10/2017,RTC in 3 months for follow-up  If they are ER+, have they started Al or Tamoxifen? If not, why? Tamoxifen 04-09-17 Discuss survivorship appointment: 06-24-17 Wilber Bihari, N.P. Have you had a mammogram scheduled? Not scheduled yet Offer referral to [facility]. Will receive at the survivorship appointment: 06-24-17 Appetite:Good eating three meals per day and snacking Pain:None today. Fatigue:Denies fatigue, able to do daily routine. Arm mobility:Raising left arm without discomfort. Lymphedema:Has mild edema that has been present since surgery. Wt Readings from Last 3 Encounters:  04/17/17 172 lb (78 kg)  04/09/17 170 lb 9.6 oz (77.4 kg)  02/08/17 172 lb 3.2 oz (78.1 kg)  BP 122/67   Pulse 85   Temp 98.4 F (36.9 C) (Oral)   Resp 18   Ht 5\' 4"  (1.626 m)   Wt 172 lb (78 kg)   LMP 03/26/2017   BMI 29.52 kg/m

## 2017-04-17 ENCOUNTER — Encounter: Payer: Self-pay | Admitting: Radiation Oncology

## 2017-04-17 ENCOUNTER — Ambulatory Visit
Admission: RE | Admit: 2017-04-17 | Discharge: 2017-04-17 | Disposition: A | Discharge status: Home / Self Care | Payer: Managed Care, Other (non HMO) | Source: Ambulatory Visit | Attending: Radiation Oncology | Admitting: Radiation Oncology

## 2017-04-17 ENCOUNTER — Telehealth: Payer: Self-pay | Admitting: *Deleted

## 2017-04-17 VITALS — BP 122/67 | HR 85 | Temp 98.4°F | Resp 18 | Ht 64.0 in | Wt 172.0 lb

## 2017-04-17 DIAGNOSIS — Z9889 Other specified postprocedural states: Secondary | ICD-10-CM | POA: Diagnosis not present

## 2017-04-17 DIAGNOSIS — Z91030 Bee allergy status: Secondary | ICD-10-CM | POA: Insufficient documentation

## 2017-04-17 DIAGNOSIS — D0512 Intraductal carcinoma in situ of left breast: Secondary | ICD-10-CM | POA: Insufficient documentation

## 2017-04-17 DIAGNOSIS — N6489 Other specified disorders of breast: Secondary | ICD-10-CM | POA: Insufficient documentation

## 2017-04-17 DIAGNOSIS — Z170 Estrogen receptor positive status [ER+]: Secondary | ICD-10-CM | POA: Insufficient documentation

## 2017-04-17 DIAGNOSIS — C50212 Malignant neoplasm of upper-inner quadrant of left female breast: Secondary | ICD-10-CM

## 2017-04-17 DIAGNOSIS — Z880 Allergy status to penicillin: Secondary | ICD-10-CM | POA: Insufficient documentation

## 2017-04-17 NOTE — Telephone Encounter (Signed)
Called patient to inform of PT appt. For 04-18-17 - arrival time - 1:30 pm @ [facility], lvm for a return call

## 2017-04-17 NOTE — Progress Notes (Signed)
Radiation Oncology         [phone] ________________________________  Name: Donna Matthews MRN: [id number]  Date: 04/17/2017  DOB: [birth year]  Post Treatment Note  CC: [name], PA  Nicholas Lose, MD  Diagnosis:   High grade ER/PR positive DCIS of the left breast  Interval Since Last Radiation:  6 weeks   01/21/17 - 03/06/17 1. Left Breast: 50.4 Gy in 28 fractions. 2. Left Breast Boost: 10 Gy in 5 fractions.  Narrative:  The patient returns today for routine follow-up. During treatment she did very well with radiotherapy and did not have significant desquamation. She was seen by Dr. Lindi Adie on 04/09/17 and has started tamoxifen.                             On review of systems, the patient states she's doing well. She has not noticed any hot flashes or night sweats thus far. She reports she has some edema and tightness of the left breast since surgery and radiation. She denies any chest pain or shortness of breath. No other complaints are verbalized.  ALLERGIES:  is allergic to bee venom and penicillins.  Meds: Current Outpatient Prescriptions  Medication Sig Dispense Refill  . acetaminophen (TYLENOL) 500 MG tablet Take 1,000 mg by mouth every 6 (six) hours as needed for mild pain.    . Biotin 1 MG CAPS Take 1 mg by mouth daily.    . IRON PO Take 1 tablet by mouth daily.     . Multiple Vitamin (MULTIVITAMIN) tablet Take 1 tablet by mouth daily.    . naproxen sodium (ANAPROX) 220 MG tablet Take 220 mg by mouth as needed.    . tamoxifen (NOLVADEX) 20 MG tablet Take 1 tablet (20 mg total) by mouth daily. 90 tablet 3   No current facility-administered medications for this encounter.     Physical Findings:  height is 5\' 4"  (1.626 m) and weight is 172 lb (78 kg). Her oral temperature is 98.4 F (36.9 C). Her blood pressure is 122/67 and her pulse is 85. Her respiration is 18.  Pain Assessment Pain Score: 0-No pain/10 In general this is a well appearing African American female in  no acute distress. She's alert and oriented x4 and appropriate throughout the examination. Cardiopulmonary assessment is negative for acute distress and she exhibits normal effort. The left breast was examined and reveals mild breast edema without fluid collection noted. Hyperpigmentation is noted without desquamation. There is mild edema of the left upper extremity in comparison to the right.   Lab Findings: Lab Results  Component Value Date   WBC 5.6 12/13/2016   HGB 8.7 (L) 12/13/2016   HCT 27.7 (L) 12/13/2016   MCV 76.5 (L) 12/13/2016   PLT 303 12/13/2016     Radiographic Findings: No results found.  Impression/Plan: 1. High grade ER/PR positive DCIS of the left breast. The patient has been doing well since completion of radiotherapy. We discussed that we would be happy to continue to follow her as needed, but she will also continue to follow up with Dr. Lindi Adie in medical oncology. She will continue Tamoxifen and was counseled on skin care as well as measures to avoid sun exposure to this area.  2. Survivorship. The patient will be seen in survivorship in July 2018. We gave her information as well regarding resources available at the cancer center. 3. Possible LUE and left breast lymphedema. The patient  will be referred for evaluation of this with PT. She is in agreement with this plan.    Carola Rhine, PAC

## 2017-04-18 ENCOUNTER — Encounter: Payer: Self-pay | Admitting: Physical Therapy

## 2017-04-18 ENCOUNTER — Ambulatory Visit: Payer: Managed Care, Other (non HMO) | Attending: Radiation Oncology | Admitting: Physical Therapy

## 2017-04-18 DIAGNOSIS — R293 Abnormal posture: Secondary | ICD-10-CM

## 2017-04-18 DIAGNOSIS — M6281 Muscle weakness (generalized): Secondary | ICD-10-CM

## 2017-04-18 DIAGNOSIS — I890 Lymphedema, not elsewhere classified: Secondary | ICD-10-CM | POA: Insufficient documentation

## 2017-04-18 NOTE — Therapy (Signed)
[place], [place], [address] Phone: [phone]   Fax:  [phone]  Physical Therapy Evaluation  Patient Details  Name: Donna Matthews MRN: [id number] Date of Birth: 07-28-1976 Referring Provider: Hayden Pedro  Encounter Date: 04/18/2017      PT End of Session - 04/18/17 1410    Visit Number 1   Number of Visits 9   Date for PT Re-Evaluation 05/16/17   PT Start Time 1330   PT Stop Time 1410   PT Time Calculation (min) 40 min   Activity Tolerance Patient tolerated treatment well   Behavior During Therapy [facility] for tasks assessed/performed      Past Medical History:  Diagnosis Date  . Anemia   . Breast cancer ([place])   . Ectopic pregnancy   . Family history of melanoma   . Family history of stomach cancer   . Fibroids   . GERD (gastroesophageal reflux disease)   . Reflux   . STD (sexually transmitted disease)    Chlamydia    Past Surgical History:  Procedure Laterality Date  . BREAST LUMPECTOMY WITH RADIOACTIVE SEED AND SENTINEL LYMPH NODE BIOPSY Left 12/18/2016   Procedure: RADIOACTIVE SEED GUIDED LEFT BREAST LUMPECTOMY WITH LEFT AXILLARY SENTINEL LYMPH NODE BIOPSY;  Surgeon: [name], MD;  Location: [facility] [facility];  Service: General;  Laterality: Left;  . DILATION AND CURETTAGE OF UTERUS      There were no vitals filed for this visit.       Subjective Assessment - 04/18/17 1333    Subjective My swelling started after surgery. I noticed it when I was able to lift my arm up higher. It has not gone down. I just asked yesterday about my breast because it is still swollen. Pt completed radiation in March of 2018.   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 It is located in the upper inner quadrant and measures 1.2 cm. It is ER/PR positive., 12/18/16 pt underwent left lumpectomy with node biopsy, pt completed chemotherapy in  March 2018 and is currently taking hormone therapy   Patient Stated Goals to manage my swelling   Currently in Pain? No/denies   Pain Score 0-No pain            [facility] PT Assessment - 04/18/17 0001      Assessment   Medical Diagnosis left breast cancer   Referring Provider Hayden Pedro   Onset Date/Surgical Date 12/18/16   Hand Dominance Right   Prior Therapy none     Precautions   Precautions Other (comment)  lymphedema     Restrictions   Weight Bearing Restrictions No     Balance Screen   Has the patient fallen in the past 6 months No   Has the patient had a decrease in activity level because of a fear of falling?  No   Is the patient reluctant to leave their home because of a fear of falling?  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   Leisure just started walking 42min 2x/day     Cognition   Overall Cognitive Status Within Functional Limits for tasks assessed     Observation/Other Assessments-Edema    Edema --  L breast is approx 25% larger than R breast     AROM   Right Shoulder Flexion 162 Degrees   Right Shoulder ABduction 165 Degrees   Right Shoulder Internal Rotation 60 Degrees   Right Shoulder External Rotation 88 Degrees   Left Shoulder Flexion 162 Degrees  pt feels tightness   Left Shoulder ABduction 165 Degrees  pt feels tightness    Left Shoulder Internal Rotation 50 Degrees   Left Shoulder External Rotation 59 Degrees     Strength   Overall Strength Deficits  L shoulder 4/5, R shoulder 5/5            LYMPHEDEMA/ONCOLOGY QUESTIONNAIRE - 04/18/17 1347      Type   Cancer Type Left breast cancer     Surgeries   Lumpectomy Date 12/18/16   Axillary Lymph Node Dissection Date 12/18/16   Number Lymph Nodes Removed 7     Date Lymphedema/Swelling Started   Date 01/10/17     Treatment   Active Chemotherapy Treatment No   Past Chemotherapy Treatment No   Active Radiation Treatment Yes   Date 03/06/17   Body Site left breast    Past Radiation Treatment No   Current Hormone Treatment Yes   Drug Name Tamoxifen   Past Hormone Therapy No     What other symptoms do you have   Are you Having Heaviness or Tightness Yes   Are you having Pain Yes  in left breast   Are you having pitting edema No   Is it Hard or Difficult finding clothes that fit No   Do you have infections No   Is there Decreased scar mobility Yes  fibrotic lumpectomy scar     Lymphedema Assessments   Lymphedema Assessments Upper extremities     Right Upper Extremity Lymphedema   15 cm Proximal to Olecranon Process 32.5 cm   10 cm Proximal to Olecranon Process 32 cm   Olecranon Process 27.1 cm   15 cm Proximal to Ulnar Styloid Process 26.1 cm   10 cm Proximal to Ulnar Styloid Process 22.8 cm   Just Proximal to Ulnar Styloid Process 17.3 cm   Across Hand at [employer] 19.8 cm   At [place] of 2nd Digit 6 cm     Left Upper Extremity Lymphedema   15 cm Proximal to Olecranon Process 33 cm   10 cm Proximal to Olecranon Process 33.1 cm   Olecranon Process 27.6 cm   15 cm Proximal to Ulnar Styloid Process 26 cm   10 cm Proximal to Ulnar Styloid Process 23 cm   Just Proximal to Ulnar Styloid Process 17.5 cm   Across Hand at [employer] 20 cm   At [place] of 2nd Digit 5.8 cm                        PT Education - 04/18/17 1409    Education provided Yes   Education Details lymphedema risk reduction strategies, anatomy and physiology of lymphatic system, benefits of compression sleeve  and bra, and wear schedule   Person(s) Educated Patient   Methods Explanation;Handout   Comprehension Verbalized understanding  Breast Clinic Goals - 11/14/16 1430      Patient will be able to verbalize understanding of pertinent lymphedema risk reduction practices relevant to her diagnosis specifically related to skin care.   Time 1   Period Days   Status Achieved     Patient will be able to return demonstrate and/or verbalize understanding of the post-op home exercise program related to regaining shoulder range of motion.   Time 1   Period Days   Status Achieved     Patient will be able to verbalize understanding of the importance of attending the postoperative After Breast Cancer Class for further lymphedema risk reduction education and therapeutic exercise.   Time 1   Period Days   Status Achieved          Long Term Clinic Goals - 04/18/17 1519      CC Long Term Goal  #1   Title Patient will be able to independently verbalize lymphedema risk reduction practices   Time 4   Period Weeks   Status New     CC Long Term Goal  #2   Title Pt to receive compression bra and compression sleeve for management of lymphedema from DME supplier   Time 4   Period Weeks   Status New     CC Long Term Goal  #3   Title Pt to report a 50% improvement in left breast swelling to allow for improved comfort.   Time 4   Period Weeks   Status New     CC Long Term Goal  #4   Title Pt to demonstrate 80 degrees of left shoulder external rotation to allow her to return to prior level of function   Baseline 59 degrees   Time 4   Period Weeks   Status New     CC Long Term Goal  #5   Title Pt to be independent in home exercise program for continued strengthening and stretching of left shoulder   Time 4   Period Weeks   Status New            Plan - 04/18/17 1410    Clinical Impression Statement Patient diagnosed with left intermediate to high grade DCIS breast  cancer located in upper inner quadrant. It is ER/PR positive. She underwent a left lumpectomy on 12/18/16 with 7 lymph nodes removed and has completed radiation at end of March 2018. She does not require chemotherapy. Pt presents to PT with left UE and left breast swelling. Her left breast is approximately 25% larger than her right breast. Her left arm is not notably larger than her right per circumference measurements but pt feels that it is heavier. She may be presenting with the early stages of lymphedema in her left upper extremity. She has full ROM of bilateral shoulders but has tightness on the left side. She also has increased weakness (4/5) in left shoulder compared to right (5/5). Pt would benfit form skilled PT services to address left shoulder weakness, left breast and UE swelling, decrease tightness in left axilla and increase scar mobility. This evaluation was of low complexity due to lack of comorbidities.    Rehab Potential Excellent   PT Frequency 2x / week   PT Duration 4 weeks   PT Treatment/Interventions Patient/family education;Therapeutic exercise;ADLs/Self Care Home Management;Manual techniques;Manual lymph drainage;Scar mobilization;Passive range of motion;Taping   PT Next Visit Plan begin MLD to LUE and breast, teach pt self MLD, supine scap series if time allows  Recommended Other Services sending script to dr to get signed for compression bra and sleeve   Consulted and Agree with Plan of Care Patient      Patient will benefit from skilled therapeutic intervention in order to improve the following deficits and impairments:  Postural dysfunction, Decreased knowledge of precautions, Pain, Impaired UE functional use, Decreased range of motion, Decreased strength, Increased edema, Decreased scar mobility  Visit Diagnosis: Lymphedema, not elsewhere classified - Plan: PT plan of care cert/re-cert  Muscle weakness (generalized) - Plan: PT plan of care cert/re-cert  Abnormal posture  - Plan: PT plan of care cert/re-cert     Problem List Patient Active Problem List   Diagnosis Date Noted  . Genetic testing 12/05/2016  . Family history of melanoma   . Family history of stomach cancer   . Malignant neoplasm of upper-inner quadrant of left female breast ([place]) 11/09/2016  . Fibroids, submucosal 05/01/2013  . Dysmenorrhea 05/01/2013  . Anemia 05/01/2013  . Menorrhagia 04/01/2013  . Obesity (BMI 30.0-34.9) 10/31/2012    [facility] 04/18/2017, 3:27 PM  [place], [place], [address] Phone: [phone]   Fax:  [phone]  Name: XARA PAULDING MRN: [id number] Date of Birth: 1976-05-11  Manus Gunning, PT 04/18/17 3:27 PM

## 2017-04-19 ENCOUNTER — Ambulatory Visit: Payer: Managed Care, Other (non HMO) | Admitting: Physical Therapy

## 2017-04-19 ENCOUNTER — Encounter: Payer: Self-pay | Admitting: Physical Therapy

## 2017-04-19 DIAGNOSIS — I890 Lymphedema, not elsewhere classified: Secondary | ICD-10-CM | POA: Diagnosis not present

## 2017-04-19 NOTE — Therapy (Signed)
[place], [place], [address] Phone: [phone]   Fax:  [phone]  Physical Therapy Treatment  Patient Details  Name: Donna Matthews MRN: [id number] Date of Birth: 23-Apr-1976 Referring Provider: Hayden Pedro  Encounter Date: 04/19/2017      PT End of Session - 04/19/17 0949    Visit Number 2   Number of Visits 9   Date for PT Re-Evaluation 05/16/17   PT Start Time 0803   PT Stop Time 0849   PT Time Calculation (min) 46 min   Activity Tolerance Patient tolerated treatment well   Behavior During Therapy [facility] for tasks assessed/performed      Past Medical History:  Diagnosis Date  . Anemia   . Breast cancer ([place])   . Ectopic pregnancy   . Family history of melanoma   . Family history of stomach cancer   . Fibroids   . GERD (gastroesophageal reflux disease)   . Reflux   . STD (sexually transmitted disease)    Chlamydia    Past Surgical History:  Procedure Laterality Date  . BREAST LUMPECTOMY WITH RADIOACTIVE SEED AND SENTINEL LYMPH NODE BIOPSY Left 12/18/2016   Procedure: RADIOACTIVE SEED GUIDED LEFT BREAST LUMPECTOMY WITH LEFT AXILLARY SENTINEL LYMPH NODE BIOPSY;  Surgeon: [name], MD;  Location: [place];  Service: General;  Laterality: Left;  . DILATION AND CURETTAGE OF UTERUS      There were no vitals filed for this visit.      Subjective Assessment - 04/19/17 0804    Subjective My swelling in the same today. When I woke up this morning it was tight.    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 It is located in the upper inner quadrant and measures 1.2 cm. It is ER/PR positive., 12/18/16 pt underwent left lumpectomy with node biopsy, pt completed chemotherapy in March 2018 and is currently taking hormone therapy   Patient Stated Goals to manage my swelling   Currently  in Pain? No/denies   Pain Score 0-No pain               LYMPHEDEMA/ONCOLOGY QUESTIONNAIRE - 04/18/17 1347      Type   Cancer Type Left breast cancer     Surgeries   Lumpectomy Date 12/18/16   Axillary Lymph Node Dissection Date 12/18/16   Number Lymph Nodes Removed 7     Date Lymphedema/Swelling Started   Date 01/10/17     Treatment   Active Chemotherapy Treatment No   Past Chemotherapy Treatment No   Active Radiation Treatment Yes   Date 03/06/17   Body Site left breast    Past Radiation Treatment No   Current Hormone Treatment Yes   Drug Name Tamoxifen   Past Hormone Therapy No     What other symptoms do you have   Are you Having Heaviness or Tightness Yes   Are you having Pain Yes  in left breast   Are you having pitting edema No   Is it Hard or Difficult finding clothes that fit No   Do you have infections No   Is there Decreased scar mobility Yes  fibrotic lumpectomy scar     Lymphedema Assessments   Lymphedema Assessments Upper extremities     Right Upper Extremity Lymphedema   15 cm Proximal to Olecranon Process 32.5 cm   10 cm Proximal to  Olecranon Process 32 cm   Olecranon Process 27.1 cm   15 cm Proximal to Ulnar Styloid Process 26.1 cm   10 cm Proximal to Ulnar Styloid Process 22.8 cm   Just Proximal to Ulnar Styloid Process 17.3 cm   Across Hand at [employer] 19.8 cm   At [place] of 2nd Digit 6 cm     Left Upper Extremity Lymphedema   15 cm Proximal to Olecranon Process 33 cm   10 cm Proximal to Olecranon Process 33.1 cm   Olecranon Process 27.6 cm   15 cm Proximal to Ulnar Styloid Process 26 cm   10 cm Proximal to Ulnar Styloid Process 23 cm   Just Proximal to Ulnar Styloid Process 17.5 cm   Across Hand at [employer] 20 cm   At [place] of 2nd Digit 5.8 cm                  [facility] Adult PT Treatment/Exercise - 04/19/17 0001      Manual Therapy   Manual Therapy Manual Lymphatic Drainage (MLD);Edema management;Myofascial  release   Edema Management created foam chip pack for pt to wear in bra for added compression   Myofascial Release along left lumpectomy scar to decrease fibrosis   Manual Lymphatic Drainage (MLD) educated and instructed pt throughout: short neck, superficial and deep abdominals, left inguinal nodes, establishment of left axillo inguinal pathway, right axillary nodes and establishment of interaxillary pathway, left upper extremity working proximal to distal then retracing steps, left breast moving fluid towards pathways, reestablishment of pathways                PT Education - 04/18/17 1409    Education provided Yes   Education Details lymphedema risk reduction strategies, anatomy and physiology of lymphatic system, benefits of compression sleeve and bra, and wear schedule   Person(s) Educated Patient   Methods Explanation;Handout   Comprehension Verbalized understanding              Breast Clinic Goals - 11/14/16 1430      Patient will be able to verbalize understanding of pertinent lymphedema risk reduction practices relevant to her diagnosis specifically related to skin care.   Time 1   Period Days   Status Achieved     Patient will be able to return demonstrate and/or verbalize understanding of the post-op home exercise program related to regaining shoulder range of motion.   Time 1   Period Days   Status Achieved     Patient will be able to verbalize understanding of the importance of attending the postoperative After Breast Cancer Class for further lymphedema risk reduction education and therapeutic exercise.   Time 1   Period Days   Status Achieved          Long Term Clinic Goals - 04/18/17 1519      CC Long Term Goal  #1   Title Patient will be able to independently verbalize lymphedema risk reduction practices   Time 4   Period Weeks   Status New     CC Long Term Goal  #2   Title Pt to receive compression bra and compression sleeve for management  of lymphedema from DME supplier   Time 4   Period Weeks   Status New     CC Long Term Goal  #3   Title Pt to report a 50% improvement in left breast swelling to allow for improved comfort.   Time 4  Period Weeks   Status New     CC Long Term Goal  #4   Title Pt to demonstrate 80 degrees of left shoulder external rotation to allow her to return to prior level of function   Baseline 59 degrees   Time 4   Period Weeks   Status New     CC Long Term Goal  #5   Title Pt to be independent in home exercise program for continued strengthening and stretching of left shoulder   Time 4   Period Weeks   Status New            Plan - 04/19/17 0950    Clinical Impression Statement Began MLD to pt's LUE and L breast today. Instructed pt throughout and will issue handout to pt at next session. Also performed scar mobilization to left lumpectomy scar in areas of fibrosis. Created foam chip pack for pt to wear in her bra for additional compression and to help manage swelling. Awaiting doctor's signature on Rx for compression sleeve and bra.    Rehab Potential Excellent   Clinical Impairments Affecting Rehab Potential none   PT Frequency 2x / week   PT Duration 4 weeks   PT Treatment/Interventions Patient/family education;Therapeutic exercise;ADLs/Self Care Home Management;Manual techniques;Manual lymph drainage;Scar mobilization;Passive range of motion;Taping   PT Next Visit Plan continue MLD to LUE and breast and issue handout, teach pt self MLD, supine scap series    Consulted and Agree with Plan of Care Patient;Family member/caregiver      Patient will benefit from skilled therapeutic intervention in order to improve the following deficits and impairments:  Postural dysfunction, Decreased knowledge of precautions, Pain, Impaired UE functional use, Decreased range of motion, Decreased strength, Increased edema, Decreased scar mobility  Visit Diagnosis: Lymphedema, not elsewhere  classified     Problem List Patient Active Problem List   Diagnosis Date Noted  . Genetic testing 12/05/2016  . Family history of melanoma   . Family history of stomach cancer   . Malignant neoplasm of upper-inner quadrant of left female breast ([place]) 11/09/2016  . Fibroids, submucosal 05/01/2013  . Dysmenorrhea 05/01/2013  . Anemia 05/01/2013  . Menorrhagia 04/01/2013  . Obesity (BMI 30.0-34.9) 10/31/2012    Allyson Sabal [facility] 04/19/2017, 9:52 AM  [place], [place], [address] Phone: [phone]   Fax:  [phone]  Name: RAVYN NIKKEL MRN: [id number] Date of Birth: 16-Oct-1976  Manus Gunning, PT 04/19/17 9:52 AM

## 2017-04-24 ENCOUNTER — Encounter: Payer: Self-pay | Admitting: Physical Therapy

## 2017-04-24 ENCOUNTER — Encounter: Payer: Self-pay | Admitting: Gynecology

## 2017-04-24 ENCOUNTER — Ambulatory Visit: Payer: Managed Care, Other (non HMO) | Admitting: Physical Therapy

## 2017-04-24 DIAGNOSIS — I890 Lymphedema, not elsewhere classified: Secondary | ICD-10-CM | POA: Diagnosis not present

## 2017-04-24 NOTE — Therapy (Signed)
[place], [place], [address] Phone: [phone]   Fax:  [phone]  Physical Therapy Treatment  Patient Details  Name: Donna Matthews MRN: [id number] Date of Birth: 27-Oct-1976 Referring Provider: Hayden Pedro  Encounter Date: 04/24/2017      PT End of Session - 04/24/17 1234    Visit Number 3   Number of Visits 9   Date for PT Re-Evaluation 05/16/17   PT Start Time 0846   PT Stop Time 0930   PT Time Calculation (min) 44 min   Activity Tolerance Patient tolerated treatment well   Behavior During Therapy [facility] for tasks assessed/performed      Past Medical History:  Diagnosis Date  . Anemia   . Breast cancer ([place])   . Ectopic pregnancy   . Family history of melanoma   . Family history of stomach cancer   . Fibroids   . GERD (gastroesophageal reflux disease)   . Reflux   . STD (sexually transmitted disease)    Chlamydia    Past Surgical History:  Procedure Laterality Date  . BREAST LUMPECTOMY WITH RADIOACTIVE SEED AND SENTINEL LYMPH NODE BIOPSY Left 12/18/2016   Procedure: RADIOACTIVE SEED GUIDED LEFT BREAST LUMPECTOMY WITH LEFT AXILLARY SENTINEL LYMPH NODE BIOPSY;  Surgeon: [name], MD;  Location: [place];  Service: General;  Laterality: Left;  . DILATION AND CURETTAGE OF UTERUS      There were no vitals filed for this visit.      Subjective Assessment - 04/24/17 0850    Subjective My swelling is about the same. I did have some soreness from the scar massage.    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 It is located in the upper inner quadrant and measures 1.2 cm. It is ER/PR positive., 12/18/16 pt underwent left lumpectomy with node biopsy, pt completed chemotherapy in March 2018 and is currently taking hormone therapy   Patient Stated Goals to manage my swelling   Currently in Pain? No/denies   Pain Score 0-No  pain                         [facility] Adult PT Treatment/Exercise - 04/24/17 0001      Manual Therapy   Manual Therapy Manual Lymphatic Drainage (MLD);Myofascial release   Manual Lymphatic Drainage (MLD) educated and instructed pt throughout using verbal, tactile cues and handout: short neck, superficial and deep abdominals, left inguinal nodes, establishment of left axillo inguinal pathway, right axillary nodes and establishment of interaxillary pathway, left upper extremity working proximal to distal then retracing steps, left breast moving fluid towards pathways, reestablishment of pathways                PT Education - 04/24/17 0856    Education provided Yes   Education Details self MLD for UE and breast   Person(s) Educated Patient   Methods Explanation;Demonstration;Handout   Comprehension Verbalized understanding;Returned demonstration              Breast Clinic Goals - 11/14/16 1430      Patient will be able to verbalize understanding of pertinent lymphedema risk reduction practices relevant to her diagnosis specifically related to skin care.   Time 1   Period Days   Status Achieved     Patient will be able to return demonstrate and/or verbalize understanding of the post-op home exercise program related to regaining  shoulder range of motion.   Time 1   Period Days   Status Achieved     Patient will be able to verbalize understanding of the importance of attending the postoperative After Breast Cancer Class for further lymphedema risk reduction education and therapeutic exercise.   Time 1   Period Days   Status Achieved          Long Term Clinic Goals - 04/18/17 1519      CC Long Term Goal  #1   Title Patient will be able to independently verbalize lymphedema risk reduction practices   Time 4   Period Weeks   Status New     CC Long Term Goal  #2   Title Pt to receive compression bra and compression sleeve for management of  lymphedema from DME supplier   Time 4   Period Weeks   Status New     CC Long Term Goal  #3   Title Pt to report a 50% improvement in left breast swelling to allow for improved comfort.   Time 4   Period Weeks   Status New     CC Long Term Goal  #4   Title Pt to demonstrate 80 degrees of left shoulder external rotation to allow her to return to prior level of function   Baseline 59 degrees   Time 4   Period Weeks   Status New     CC Long Term Goal  #5   Title Pt to be independent in home exercise program for continued strengthening and stretching of left shoulder   Time 4   Period Weeks   Status New            Plan - 04/24/17 0857    Clinical Impression Statement Instructed pt today in MLD to LUE and breast and issued handout for pt to be able to perform at home. Pt demonstrated correct technique today and correct sequence with minimal verbal and tactile cues.  Continued with scar mobilization to left lumpectomy scar. Gave pt signed prescription to take to DME supplier to obtain a compression sleeve and bra.    Rehab Potential Excellent   Clinical Impairments Affecting Rehab Potential none   PT Frequency 2x / week   PT Duration 4 weeks   PT Treatment/Interventions Patient/family education;Therapeutic exercise;ADLs/Self Care Home Management;Manual techniques;Manual lymph drainage;Scar mobilization;Passive range of motion;Taping   PT Next Visit Plan continue MLD to LUE and breast, see if pt had questions after last session, supine scap series    PT Home Exercise Plan Post op shoulder ROM HEP   Consulted and Agree with Plan of Care Patient      Patient will benefit from skilled therapeutic intervention in order to improve the following deficits and impairments:  Postural dysfunction, Decreased knowledge of precautions, Pain, Impaired UE functional use, Decreased range of motion, Decreased strength, Increased edema, Decreased scar mobility  Visit Diagnosis: Lymphedema, not  elsewhere classified     Problem List Patient Active Problem List   Diagnosis Date Noted  . Genetic testing 12/05/2016  . Family history of melanoma   . Family history of stomach cancer   . Malignant neoplasm of upper-inner quadrant of left female breast ([place]) 11/09/2016  . Fibroids, submucosal 05/01/2013  . Dysmenorrhea 05/01/2013  . Anemia 05/01/2013  . Menorrhagia 04/01/2013  . Obesity (BMI 30.0-34.9) 10/31/2012    Allyson Sabal [facility] 04/24/2017, 12:36 PM  [place], [place], [address]  Phone: [phone]   Fax:  [phone]  Name: Donna Matthews MRN: [id number] Date of Birth: 1976/06/12  Manus Gunning, PT 04/24/17 12:36 PM

## 2017-04-24 NOTE — Patient Instructions (Signed)
Self manual lymph drainage: Perform this sequence once a day.  Only give enough pressure no your skin to make the skin move.  Diaphragmatic - Supine   Inhale through nose making navel move out toward hands. Exhale through puckered lips, hands follow navel in. Repeat _5__ times. Rest _10__ seconds between repeats.   Copyright  VHI. All rights reserved.  Hug yourself.  Do circles at your neck just above your collarbones.  Repeat this 10 times.   Copyright  VHI. All rights reserved.  LEG: Inguinal Nodes Stimulation   With small finger side of hand against hip crease on involved side, gently perform circles at the crease. Repeat __10_ times.   Copyright  VHI. All rights reserved.  Axilla to Inguinal Nodes - Sweep   On involved side, sweep _4__ times from armpit along side of trunk to hip crease.  Axilla to Axilla - Sweep   On uninvolved side make 10 circles in the armpit, then pump _5__ times from involved armpit across chest to uninvolved armpit, making a pathway. Do _1__ time per day.  Draw an imaginary diagonal line from upper outer breast through the nipple area toward lower inner breast.  Direct fluid upward and inward from this line toward the pathway across your upper chest .  Do this in three rows to treat all of the upper inner breast tissue, and do each row 3-4x.      Direct fluid to treat all of lower outer breast tissue downward and outward toward      pathway that is aimed at the left groin.  Finish by doing the pathways as described above going from your involved armpit to the same side groin and going across your upper chest from the involved shoulder to the uninvolved shoulder.  Repeat the steps above where you do circles in your left groin and right armpit.   Copyright  VHI. All rights reserved.  Arm Posterior: Elbow to Shoulder - Sweep   Pump _5__ times from back of elbow to top of shoulder. Then inner to outer upper arm _5_ times, then outer arm again  _5_ times. Then back to the pathways _2-3_ times. Do _1__ time per day.  Copyright  VHI. All rights reserved.  ARM: [place] Wrist to Elbow - Sweep   Pump or stationary circles _5__ times from wrist to elbow making sure to do both sides of the forearm. Then retrace your steps to the outer arm, and the pathways _2-3_ times each. Do _1__ time per day.  Copyright  VHI. All rights reserved.  ARM: Dorsum of Hand to Shoulder - Sweep   Pump or stationary circles _5__ times on back of hand including knuckle spaces and individual fingers if needed working up towards the wrist, then retrace all your steps working back up the forearm, doing both sides; upper outer arm and back to your pathways _2-3_ times each. Then do 5 circles again at uninvolved armpit and involved groin where you started! Good job!! Do __1_ time per day.  Copyright  VHI. All rights reserved.

## 2017-04-26 ENCOUNTER — Ambulatory Visit: Payer: Managed Care, Other (non HMO) | Admitting: Physical Therapy

## 2017-04-26 DIAGNOSIS — I890 Lymphedema, not elsewhere classified: Secondary | ICD-10-CM

## 2017-04-26 DIAGNOSIS — R293 Abnormal posture: Secondary | ICD-10-CM

## 2017-04-26 DIAGNOSIS — M6281 Muscle weakness (generalized): Secondary | ICD-10-CM

## 2017-04-26 NOTE — Therapy (Signed)
[place], [place], [address] Phone: [phone]   Fax:  [phone]  Physical Therapy Treatment  Patient Details  Name: Donna Matthews MRN: [id number] Date of Birth: December 22, 1975 Referring Provider: Hayden Pedro  Encounter Date: 04/26/2017      PT End of Session - 04/26/17 1238    Visit Number 4   Number of Visits 9   Date for PT Re-Evaluation 05/16/17   PT Start Time 0930   PT Stop Time 1015   PT Time Calculation (min) 45 min   Activity Tolerance Patient tolerated treatment well   Behavior During Therapy [facility] for tasks assessed/performed      Past Medical History:  Diagnosis Date  . Anemia   . Breast cancer ([place])   . Ectopic pregnancy   . Family history of melanoma   . Family history of stomach cancer   . Fibroids   . GERD (gastroesophageal reflux disease)   . Reflux   . STD (sexually transmitted disease)    Chlamydia    Past Surgical History:  Procedure Laterality Date  . BREAST LUMPECTOMY WITH RADIOACTIVE SEED AND SENTINEL LYMPH NODE BIOPSY Left 12/18/2016   Procedure: RADIOACTIVE SEED GUIDED LEFT BREAST LUMPECTOMY WITH LEFT AXILLARY SENTINEL LYMPH NODE BIOPSY;  Surgeon: [name], MD;  Location: [place];  Service: General;  Laterality: Left;  . DILATION AND CURETTAGE OF UTERUS      There were no vitals filed for this visit.      Subjective Assessment - 04/26/17 0936    Subjective Pt reports she had a little bit of soreness in the breast and under her arm yesterday.  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 It is located in the upper inner quadrant and measures 1.2 cm. It is ER/PR positive., 12/18/16 pt underwent left lumpectomy with node biopsy, pt completed chemotherapy in March 2018 and is currently  taking hormone therapy   Patient Stated Goals to manage my swelling   Currently in Pain? No/denies                         [facility] Adult PT Treatment/Exercise - 04/26/17 0001      Self-Care   Self-Care Other Self-Care Comments   Other Self-Care Comments  showed pt different types of compression bras  gave information about [facility] exercise program      Shoulder Exercises: Supine   Other Supine Exercises supine dowel rod flexion and abduction      Shoulder Exercises: Sidelying   ABduction AAROM;Left;5 reps  deep breath at the top    Other Sidelying Exercises hand pointed to ceiling: 5 small controlled circles in each direction      Manual Therapy   Manual Therapy Manual Lymphatic Drainage (MLD);Myofascial release   Manual Lymphatic Drainage (MLD) educated and instructed pt throughout using verbal, tactile cues short neck, superficial and deep abdominals, left inguinal nodes, establishment of left axillo inguinal pathway, right axillary nodes and establishment of interaxillary pathway, left upper extremity working proximal to distal then retracing steps, left breast moving fluid towards pathways, reestablishment of pathways then to right sidelying for posterior interaxillay anastamosis and lateral trunk alos same with coordination of sidelying shoulder abduction exercise  PT Education - 04/26/17 1238    Education provided Yes   Education Details supine dowel rod and sidelying shoulder exercise    Person(s) Educated Patient   Methods Explanation;Handout   Comprehension Verbalized understanding;Returned demonstration              Breast Clinic Goals - 11/14/16 1430      Patient will be able to verbalize understanding of pertinent lymphedema risk reduction practices relevant to her diagnosis specifically related to skin care.   Time 1   Period Days   Status Achieved     Patient will be able to return demonstrate  and/or verbalize understanding of the post-op home exercise program related to regaining shoulder range of motion.   Time 1   Period Days   Status Achieved     Patient will be able to verbalize understanding of the importance of attending the postoperative After Breast Cancer Class for further lymphedema risk reduction education and therapeutic exercise.   Time 1   Period Days   Status Achieved          Long Term Clinic Goals - 04/26/17 1245      CC Long Term Goal  #1   Title Patient will be able to independently verbalize lymphedema risk reduction practices   Time 4   Period Weeks   Status On-going     CC Long Term Goal  #2   Title Pt to receive compression bra and compression sleeve for management of lymphedema from DME supplier   Time 4   Period Weeks   Status On-going     CC Long Term Goal  #3   Title Pt to report a 50% improvement in left breast swelling to allow for improved comfort.   Time 4   Period Weeks   Status On-going     CC Long Term Goal  #4   Title Pt to demonstrate 80 degrees of left shoulder external rotation to allow her to return to prior level of function   Baseline 59 degrees   Time 4   Period Weeks   Status On-going            Plan - 04/26/17 1242    Clinical Impression Statement Scars were more moveable today with little fibrosis.  Pt feels breast feels a little softer after she is up and moving around a bit. Upgraded program to include exercise and encouraged pt to do more exercise, including gradually progressive strength exercise to decrease lymphedema risk.    Rehab Potential Excellent   Clinical Impairments Affecting Rehab Potential none   PT Frequency 2x / week   PT Duration 4 weeks   PT Treatment/Interventions Patient/family education;Therapeutic exercise;ADLs/Self Care Home Management;Manual techniques;Manual lymph drainage;Scar mobilization;Passive range of motion;Taping   PT Next Visit Plan continue MLD to LUE and breast, see if  pt had questions after last session, supine scap series    PT Home Exercise Plan Post op shoulder ROM HEP   Consulted and Agree with Plan of Care Patient      Patient will benefit from skilled therapeutic intervention in order to improve the following deficits and impairments:  Postural dysfunction, Decreased knowledge of precautions, Pain, Impaired UE functional use, Decreased range of motion, Decreased strength, Increased edema, Decreased scar mobility  Visit Diagnosis: Lymphedema, not elsewhere classified  Muscle weakness (generalized)  Abnormal posture     Problem List Patient Active Problem List   Diagnosis Date Noted  . Genetic testing 12/05/2016  . Family  history of melanoma   . Family history of stomach cancer   . Malignant neoplasm of upper-inner quadrant of left female breast ([place]) 11/09/2016  . Fibroids, submucosal 05/01/2013  . Dysmenorrhea 05/01/2013  . Anemia 05/01/2013  . Menorrhagia 04/01/2013  . Obesity (BMI 30.0-34.9) 10/31/2012   Donato Heinz. Owens Shark PT  Norwood Levo 04/26/2017, 12:47 PM  [place], [place], [address] Phone: [phone]   Fax:  [phone]  Name: ANGELLY SPEARING MRN: [id number] Date of Birth: 1976/09/16

## 2017-04-26 NOTE — Patient Instructions (Addendum)
SHOULDER: Flexion - Supine (Cane)        Cancer Rehab [phone]    Hold cane in both hands. Raise arms up overhead. Do not allow back to arch. Hold _5__ seconds. Do __5-10__ times; __1-2__ times a day.   SELF ASSISTED WITH OBJECT: Shoulder Abduction / Adduction - Supine    Hold cane with both hands. Move both arms from side to side, keep elbows straight.  Hold when stretch felt for __5__ seconds. Repeat __5-10__ times; __1-2__ times a day. Once this becomes easier progress to third picture bringing affected arm towards ear by staying out to side. Same hold for _5_seconds. Repeat  _5-10_ times, _1-2_ times/day.  Shoulder Blade Stretch    Clasp fingers behind head with elbows touching in front of face. Pull elbows back while pressing shoulder blades together. Relax and hold as tolerated, can place pillow under elbow here for comfort as needed and to allow for prolonged stretch.  Repeat __5__ times. Do __1-2__ sessions per day.     Lie on you right side. Reach arm up over your head and take a deep breath and return it to your side  5 reps. Then point hand up to ceiling and make 5 small controlled circles in one direction, then 5 to the other                 Over Head Pull: Narrow and Wide Grip   Cancer Rehab [phone]   On back, knees bent, feet flat, band across thighs, elbows straight but relaxed. Pull hands apart (start). Keeping elbows straight, bring arms up and over head, hands toward floor. Keep pull steady on band. Hold momentarily. Return slowly, keeping pull steady, back to start. Then do same with a wider grip on the band (past shoulder width) Repeat _5-10__ times. Band color __yellow____   Side Pull: Double Arm   On back, knees bent, feet flat. Arms perpendicular to body, shoulder level, elbows straight but relaxed. Pull arms out to sides, elbows straight. Resistance band comes across collarbones, hands toward floor. Hold momentarily. Slowly return to starting  position. Repeat _5-10__ times. Band color _yellow____   Sword   On back, knees bent, feet flat, left hand on left hip, right hand above left. Pull right arm DIAGONALLY (hip to shoulder) across chest. Bring right arm along head toward floor. Hold momentarily. Slowly return to starting position. Repeat _5-10__ times. Do with left arm. Band color _yellow_____   Shoulder Rotation: Double Arm   On back, knees bent, feet flat, elbows tucked at sides, bent 90, hands palms up. Pull hands apart and down toward floor, keeping elbows near sides. Hold momentarily. Slowly return to starting position. Repeat _5-10__ times. Band color __yellow____

## 2017-05-01 ENCOUNTER — Ambulatory Visit: Payer: Managed Care, Other (non HMO) | Admitting: Physical Therapy

## 2017-05-01 DIAGNOSIS — M6281 Muscle weakness (generalized): Secondary | ICD-10-CM

## 2017-05-01 DIAGNOSIS — I890 Lymphedema, not elsewhere classified: Secondary | ICD-10-CM | POA: Diagnosis not present

## 2017-05-01 DIAGNOSIS — R293 Abnormal posture: Secondary | ICD-10-CM

## 2017-05-01 NOTE — Therapy (Signed)
[place], [place], [address] Phone: [phone]   Fax:  [phone]  Physical Therapy Treatment  Patient Details  Name: Donna Matthews MRN: [id number] Date of Birth: 02/01/1976 Referring Provider: Hayden Pedro  Encounter Date: 05/01/2017      PT End of Session - 05/01/17 1036    Visit Number 5   Number of Visits 9   Date for PT Re-Evaluation 05/16/17   PT Start Time 0935   PT Stop Time 1020   PT Time Calculation (min) 45 min   Activity Tolerance Patient tolerated treatment well   Behavior During Therapy [facility] for tasks assessed/performed      Past Medical History:  Diagnosis Date  . Anemia   . Breast cancer ([place])   . Ectopic pregnancy   . Family history of melanoma   . Family history of stomach cancer   . Fibroids   . GERD (gastroesophageal reflux disease)   . Reflux   . STD (sexually transmitted disease)    Chlamydia    Past Surgical History:  Procedure Laterality Date  . BREAST LUMPECTOMY WITH RADIOACTIVE SEED AND SENTINEL LYMPH NODE BIOPSY Left 12/18/2016   Procedure: RADIOACTIVE SEED GUIDED LEFT BREAST LUMPECTOMY WITH LEFT AXILLARY SENTINEL LYMPH NODE BIOPSY;  Surgeon: [name], MD;  Location: [place];  Service: General;  Laterality: Left;  . DILATION AND CURETTAGE OF UTERUS      There were no vitals filed for this visit.      Subjective Assessment - 05/01/17 0945    Subjective Pt feels no change in the swelling of the breast . 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 It is located in the upper inner quadrant and measures 1.2 cm. It is ER/PR positive., 12/18/16 pt underwent left lumpectomy with node biopsy, pt completed chemotherapy in March 2018 and is currently taking hormone therapy   Patient Stated Goals to manage my swelling                          [facility] Adult PT Treatment/Exercise - 05/01/17 0001      Self-Care   Self-Care Other Self-Care Comments   Other Self-Care Comments  called [facility] [employer] to assure them that pt had prescription for compression bra and also for sleeve at [facility]      Shoulder Exercises: Supine   Horizontal ABduction Strengthening;Both;5 reps;Theraband   Theraband Level (Shoulder Horizontal ABduction) Level 1 (Yellow)   External Rotation Strengthening;Left;5 reps;Theraband   Theraband Level (Shoulder External Rotation) Level 1 (Yellow)   Flexion Strengthening;Both;5 reps;Theraband  narrow and wide grip    Theraband Level (Shoulder Flexion) Level 1 (Yellow)   Other Supine Exercises diagonal elevation with yellow theraband with both arms      Manual Therapy   Manual Therapy Manual Lymphatic Drainage (MLD);Taping   Manual Lymphatic Drainage (MLD) educated and instructed pt throughout using verbal, tactile cues short neck, superficial and deep abdominals, left inguinal nodes, establishment of left axillo inguinal pathway, right axillary nodes and establishment of interaxillary pathway, left upper extremity working proximal to distal then retracing steps, left breast moving fluid towards pathways, reestablishment of pathways then to right sidelying for posterior interaxillay anastamosis and lateral trunk alos same with coordination of sidelying shoulder abduction exercise      Kinesiotix   Edema skincote  and kinesiotex in fan shape from left inguinal nodes to axilla and breast                       Breast Clinic Goals - 11/14/16 1430      Patient will be able to verbalize understanding of pertinent lymphedema risk reduction practices relevant to her diagnosis specifically related to skin care.   Time 1   Period Days   Status Achieved     Patient will be able to return demonstrate and/or verbalize understanding of the post-op home exercise program  related to regaining shoulder range of motion.   Time 1   Period Days   Status Achieved     Patient will be able to verbalize understanding of the importance of attending the postoperative After Breast Cancer Class for further lymphedema risk reduction education and therapeutic exercise.   Time 1   Period Days   Status Achieved          Long Term Clinic Goals - 04/26/17 1245      CC Long Term Goal  #1   Title Patient will be able to independently verbalize lymphedema risk reduction practices   Time 4   Period Weeks   Status On-going     CC Long Term Goal  #2   Title Pt to receive compression bra and compression sleeve for management of lymphedema from DME supplier   Time 4   Period Weeks   Status On-going     CC Long Term Goal  #3   Title Pt to report a 50% improvement in left breast swelling to allow for improved comfort.   Time 4   Period Weeks   Status On-going     CC Long Term Goal  #4   Title Pt to demonstrate 80 degrees of left shoulder external rotation to allow her to return to prior level of function   Baseline 59 degrees   Time 4   Period Weeks   Status On-going            Plan - 05/01/17 1037    Clinical Impression Statement Pt continues with tightness in left breast, though she says that it is much less that what is was originally and decrased range of motion of left arm. Upgraded program to include yellow theraband exercise to UE and kinesiotape to left breast,  Pt needs continued insturction in self manual lymph drainage.  She is going to get compression bra today so that should help as well,    Rehab Potential Excellent   Clinical Impairments Affecting Rehab Potential none   PT Frequency 2x / week   PT Duration 4 weeks   PT Treatment/Interventions Patient/family education;Therapeutic exercise;ADLs/Self Care Home Management;Manual techniques;Manual lymph drainage;Scar mobilization;Passive range of motion;Taping   PT Next Visit Plan assess effect of  kinesiotape and compression bra.  Add shoulder stretches with pullies and ball on wall add wall wash to HEP    Consulted and Agree with Plan of Care Patient      Patient will benefit from skilled therapeutic intervention in order to improve the following deficits and impairments:  Postural dysfunction, Decreased knowledge of precautions, Pain, Impaired UE functional use, Decreased range of motion, Decreased strength, Increased edema, Decreased scar mobility  Visit Diagnosis: Lymphedema, not elsewhere classified  Muscle weakness (generalized)  Abnormal posture     Problem List Patient Active Problem List   Diagnosis Date Noted  . Genetic testing 12/05/2016  . Family history  of melanoma   . Family history of stomach cancer   . Malignant neoplasm of upper-inner quadrant of left female breast ([place]) 11/09/2016  . Fibroids, submucosal 05/01/2013  . Dysmenorrhea 05/01/2013  . Anemia 05/01/2013  . Menorrhagia 04/01/2013  . Obesity (BMI 30.0-34.9) 10/31/2012   Donato Heinz. Owens Shark PT  Norwood Levo 05/01/2017, 10:40 AM  [place], [place], [address] Phone: [phone]   Fax:  [phone]  Name: Donna Matthews MRN: [id number] Date of Birth: August 10, 1976

## 2017-05-01 NOTE — Patient Instructions (Addendum)
Over Head Pull: Narrow and Wide Grip   Cancer Rehab [phone]   On back, knees bent, feet flat, band across thighs, elbows straight but relaxed. Pull hands apart (start). Keeping elbows straight, bring arms up and over head, hands toward floor. Keep pull steady on band. Hold momentarily. Return slowly, keeping pull steady, back to start. Then do same with a wider grip on the band (past shoulder width) Repeat _5-10__ times. Band color __yellow____   Side Pull: Double Arm   On back, knees bent, feet flat. Arms perpendicular to body, shoulder level, elbows straight but relaxed. Pull arms out to sides, elbows straight. Resistance band comes across collarbones, hands toward floor. Hold momentarily. Slowly return to starting position. Repeat _5-10__ times. Band color _yellow____   Sword   On back, knees bent, feet flat, left hand on left hip, right hand above left. Pull right arm DIAGONALLY (hip to shoulder) across chest. Bring right arm along head toward floor. Hold momentarily. Slowly return to starting position. Repeat _5-10__ times. Do with left arm. Band color _yellow_____   Shoulder Rotation: Double Arm   On back, knees bent, feet flat, elbows tucked at sides, bent 90, hands palms up. Pull hands apart and down toward floor, keeping elbows near sides. Hold momentarily. Slowly return to starting position. Repeat _5-10__ times. Band color __yellow____     Kinesiotape should stay on your body for a few days.  It's OK to shower with it on.  Just pat it dry afterwards.    While the tape is on, keep an eye on the skin around it.  If you feel any itching, see any redness or feel in any way that the tape is irritating your skin, it is time to gently remove it.   To loosen the tape from your skin, use something oily like olive oil or baby oil on a cotton ball.  Gently roll the edge of the tape away from the skin.  Then, hold the edge of the tape away from the skin and gently press the skin away from  the tape.  Do not pull the tape off the skin or you may cause skin irritation.  Then, inspect your skin.  Gently cleanse with soap and water

## 2017-05-02 NOTE — Addendum Note (Signed)
Encounter addended by: Malena Edman, RN on: 05/02/2017  8:26 AM<BR>    Actions taken: Charge Capture section accepted

## 2017-05-03 ENCOUNTER — Ambulatory Visit: Payer: Managed Care, Other (non HMO) | Admitting: Physical Therapy

## 2017-05-03 DIAGNOSIS — M6281 Muscle weakness (generalized): Secondary | ICD-10-CM

## 2017-05-03 DIAGNOSIS — R293 Abnormal posture: Secondary | ICD-10-CM

## 2017-05-03 DIAGNOSIS — I890 Lymphedema, not elsewhere classified: Secondary | ICD-10-CM

## 2017-05-03 NOTE — Therapy (Signed)
[place], [place], [address] Phone: [phone]   Fax:  [phone]  Physical Therapy Treatment  Patient Details  Name: Donna Matthews MRN: [id number] Date of Birth: 11-15-1976 Referring Provider: Hayden Pedro  Encounter Date: 05/03/2017      PT End of Session - 05/03/17 1134    Visit Number 6   Number of Visits 9   Date for PT Re-Evaluation 05/16/17   PT Start Time 0800   PT Stop Time 0845   PT Time Calculation (min) 45 min   Activity Tolerance Patient tolerated treatment well   Behavior During Therapy [facility] for tasks assessed/performed      Past Medical History:  Diagnosis Date  . Anemia   . Breast cancer ([place])   . Ectopic pregnancy   . Family history of melanoma   . Family history of stomach cancer   . Fibroids   . GERD (gastroesophageal reflux disease)   . Reflux   . STD (sexually transmitted disease)    Chlamydia    Past Surgical History:  Procedure Laterality Date  . BREAST LUMPECTOMY WITH RADIOACTIVE SEED AND SENTINEL LYMPH NODE BIOPSY Left 12/18/2016   Procedure: RADIOACTIVE SEED GUIDED LEFT BREAST LUMPECTOMY WITH LEFT AXILLARY SENTINEL LYMPH NODE BIOPSY;  Surgeon: [name], MD;  Location: [facility];  Service: General;  Laterality: Left;  . DILATION AND CURETTAGE OF UTERUS      There were no vitals filed for this visit.      Subjective Assessment - 05/03/17 0804    Subjective Pt comes in with compression bras to make sure they are [facility].  compression sleeve is on order.  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 It is located in the upper inner quadrant and measures 1.2 cm. It is ER/PR positive., 12/18/16 pt underwent left lumpectomy with node biopsy, pt completed chemotherapy in March 2018 and is currently taking hormone therapy   Patient Stated Goals to manage my  swelling   Currently in Pain? No/denies            [facility] PT Assessment - 05/03/17 0001      AROM   Left Shoulder Flexion 165 Degrees   Left Shoulder ABduction 165 Degrees                     [facility] Adult PT Treatment/Exercise - 05/03/17 0001      Self-Care   Self-Care Other Self-Care Comments   Other Self-Care Comments  Pt comes in with 2 types of compression bra. One style provides good compression to breast but allows skin to push up at the anterior axilla.  The other covers the axilla better but does not provide as much compression to the breast.  Pt will keep both and use them as necessary      Shoulder Exercises: Pulleys   Flexion 2 minutes   ABduction 2 minutes   ABduction Limitations pt felt more of a stretch with abduction and arm feels tired      Shoulder Exercises: ROM/Strengthening   Wall Wash with folded towel for flexion and 'windshield washer"    Ball on Wall up the wall with both arms in flexion with pause at the top to stretch      Shoulder Exercises: Isometric Strengthening   Flexion 5X5"  left shoulder    Extension 5X5"  left  shoulder    ABduction 5X5"  left shoulder      Kinesiotix   Edema remove old ktape. Allied skinkote and basketweave I band across scar at top of breast and fan shape across posterior interaxillary anastamsis from left upper arm to just across middle back                       Breast Clinic Goals - 11/14/16 1430      Patient will be able to verbalize understanding of pertinent lymphedema risk reduction practices relevant to her diagnosis specifically related to skin care.   Time 1   Period Days   Status Achieved     Patient will be able to return demonstrate and/or verbalize understanding of the post-op home exercise program related to regaining shoulder range of motion.   Time 1   Period Days   Status Achieved     Patient will be able to verbalize understanding of the importance of attending the  postoperative After Breast Cancer Class for further lymphedema risk reduction education and therapeutic exercise.   Time 1   Period Days   Status Achieved          Long Term Clinic Goals - 05/03/17 1136      CC Long Term Goal  #1   Title Patient will be able to independently verbalize lymphedema risk reduction practices   Time 4   Period Weeks   Status On-going     CC Long Term Goal  #2   Title Pt to receive compression bra and compression sleeve for management of lymphedema from DME supplier   Status Achieved     CC Long Term Goal  #3   Title Pt to report a 50% improvement in left breast swelling to allow for improved comfort.   Time 4   Period Weeks   Status On-going     CC Long Term Goal  #4   Title Pt to demonstrate 80 degrees of left shoulder external rotation to allow her to return to prior level of function   Baseline 59 degrees   Time 4   Period Weeks   Status On-going     CC Long Term Goal  #5   Title Pt to be independent in home exercise program for continued strengthening and stretching of left shoulder   Time 4   Period Weeks   Status On-going            Plan - 05/03/17 0831    Clinical Impression Statement Pt has compression bras and feels that she will be able to wear them to control her swelling.  She is able to verbalize what she is trying to achieve with the compression She is doing well with range of motion, but c/o fatigue with exercise.  Worked on shoulder exercise today and added isometric exercise for strengthening that she can easily do several times a day    Rehab Potential Excellent   Clinical Impairments Affecting Rehab Potential none   PT Frequency 2x / week   PT Duration 4 weeks   PT Next Visit Plan Reassess and remeasure left shoulder ER for goal attainment.  Measure left arm circumfence and advise about compression sleeve wear as it has been ordered continue with manual lymph drainage and kinesiotape as inicated. Review HEP for left  shoulder strength and upgrade as needed.  Encourage livestrong program      Patient will benefit from skilled therapeutic intervention in order to  improve the following deficits and impairments:  Postural dysfunction, Decreased knowledge of precautions, Pain, Impaired UE functional use, Decreased range of motion, Decreased strength, Increased edema, Decreased scar mobility  Visit Diagnosis: Lymphedema, not elsewhere classified  Muscle weakness (generalized)  Abnormal posture     Problem List Patient Active Problem List   Diagnosis Date Noted  . Genetic testing 12/05/2016  . Family history of melanoma   . Family history of stomach cancer   . Malignant neoplasm of upper-inner quadrant of left female breast ([place]) 11/09/2016  . Fibroids, submucosal 05/01/2013  . Dysmenorrhea 05/01/2013  . Anemia 05/01/2013  . Menorrhagia 04/01/2013  . Obesity (BMI 30.0-34.9) 10/31/2012   Donato Heinz. Owens Shark PT  Norwood Levo 05/03/2017, 11:39 AM  [place], [place], [address] Phone: [phone]   Fax:  [phone]  Name: RAZIAH FUNNELL MRN: [id number] Date of Birth: 1976-10-21

## 2017-05-03 NOTE — Patient Instructions (Signed)

## 2017-05-07 ENCOUNTER — Ambulatory Visit: Payer: Managed Care, Other (non HMO) | Admitting: Physical Therapy

## 2017-05-07 ENCOUNTER — Encounter: Payer: Self-pay | Admitting: Physical Therapy

## 2017-05-07 DIAGNOSIS — I890 Lymphedema, not elsewhere classified: Secondary | ICD-10-CM | POA: Diagnosis not present

## 2017-05-07 DIAGNOSIS — M6281 Muscle weakness (generalized): Secondary | ICD-10-CM

## 2017-05-07 DIAGNOSIS — R293 Abnormal posture: Secondary | ICD-10-CM

## 2017-05-07 NOTE — Therapy (Signed)
[place], [place], [address] Phone: [phone]   Fax:  [phone]  Physical Therapy Treatment  Patient Details  Name: Donna Matthews MRN: [id number] Date of Birth: 07/22/1976 Referring Provider: Hayden Pedro  Encounter Date: 05/07/2017      PT End of Session - 05/07/17 1704    Visit Number 7   Number of Visits 9   Date for PT Re-Evaluation 05/16/17   PT Start Time 6606   PT Stop Time 1648   PT Time Calculation (min) 42 min   Activity Tolerance Patient tolerated treatment well   Behavior During Therapy [facility] for tasks assessed/performed      Past Medical History:  Diagnosis Date  . Anemia   . Breast cancer ([place])   . Ectopic pregnancy   . Family history of melanoma   . Family history of stomach cancer   . Fibroids   . GERD (gastroesophageal reflux disease)   . Reflux   . STD (sexually transmitted disease)    Chlamydia    Past Surgical History:  Procedure Laterality Date  . BREAST LUMPECTOMY WITH RADIOACTIVE SEED AND SENTINEL LYMPH NODE BIOPSY Left 12/18/2016   Procedure: RADIOACTIVE SEED GUIDED LEFT BREAST LUMPECTOMY WITH LEFT AXILLARY SENTINEL LYMPH NODE BIOPSY;  Surgeon: [name], MD;  Location: [facility];  Service: General;  Laterality: Left;  . DILATION AND CURETTAGE OF UTERUS      There were no vitals filed for this visit.      Subjective Assessment - 05/07/17 1608    Subjective My arm is feeling better. I am wearing the compression bra on now. I got the bras last Wednesday. I think the kinesiotape was helping.    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 It is located in the upper inner quadrant and measures 1.2 cm. It is ER/PR positive., 12/18/16 pt underwent left lumpectomy with node biopsy, pt completed chemotherapy in March 2018 and is currently taking hormone therapy   Patient Stated Goals to manage my  swelling   Currently in Pain? No/denies   Pain Score 0-No pain                         [facility] Adult PT Treatment/Exercise - 05/07/17 0001      Shoulder Exercises: Pulleys   Flexion 2 minutes   ABduction 2 minutes     Shoulder Exercises: ROM/Strengthening   Wall Wash with folded towel for flexion and 'windshield washer" x 10    Ball on Wall up the wall with both arms in flexion with pause at the top to stretch      Shoulder Exercises: Isometric Strengthening   Flexion 5X5"  left shoulder    Extension 5X5"  left shoulder    ABduction 5X5"  left shoulder      Manual Therapy   Manual Lymphatic Drainage (MLD) short neck, 5 diaphragmatic breaths, left inguinal nodes, establishment of left axillo inguinal pathway, right axillary nodes and establishment of interaxillary pathway, left breast moving fluid towards pathways, reestablishment of pathways      Kinesiotix   Edema skinkote and kinesiotex in fan shape from left inguinal nodes to breast                       Breast Clinic Goals - 11/14/16 1430      Patient will be able to verbalize understanding  of pertinent lymphedema risk reduction practices relevant to her diagnosis specifically related to skin care.   Time 1   Period Days   Status Achieved     Patient will be able to return demonstrate and/or verbalize understanding of the post-op home exercise program related to regaining shoulder range of motion.   Time 1   Period Days   Status Achieved     Patient will be able to verbalize understanding of the importance of attending the postoperative After Breast Cancer Class for further lymphedema risk reduction education and therapeutic exercise.   Time 1   Period Days   Status Achieved          Long Term Clinic Goals - 05/07/17 1609      CC Long Term Goal  #1   Title Patient will be able to independently verbalize lymphedema risk reduction practices   Time 4   Period Weeks   Status  On-going     CC Long Term Goal  #2   Title Pt to receive compression bra and compression sleeve for management of lymphedema from DME supplier   Baseline 05/07/17- pt has received compression bra and is awaiting on compression sleeve to arrive   Time 4   Period Weeks   Status Achieved     CC Long Term Goal  #3   Title Pt to report a 50% improvement in left breast swelling to allow for improved comfort.   Baseline 05/07/17- pt reports minimal change but reports she does see less indentations when she takes her bra off   Time 4   Period Weeks   Status On-going     CC Long Term Goal  #4   Title Pt to demonstrate 80 degrees of left shoulder external rotation to allow her to return to prior level of function   Baseline 59 degrees, 05/07/17- 82 degrees   Time 4   Period Weeks   Status Achieved     CC Long Term Goal  #5   Title Pt to be independent in home exercise program for continued strengthening and stretching of left shoulder   Time 4   Period Weeks   Status On-going            Plan - 05/07/17 1704    Clinical Impression Statement Assessed pt's progress towards goals in therapy today. She demonstrates excellent progress towards all goals and met her ROM goal. She is still having left breast swelling and recently received a compression bra for managment of left breast lymphedema. Continued with range of motion exercises and manual lymphatic drainage today. Reapplied kinesiotape to help reduce left breast swelling.    Rehab Potential Excellent   Clinical Impairments Affecting Rehab Potential none   PT Frequency 2x / week   PT Duration 4 weeks   PT Treatment/Interventions Patient/family education;Therapeutic exercise;ADLs/Self Care Home Management;Manual techniques;Manual lymph drainage;Scar mobilization;Passive range of motion;Taping   PT Next Visit Plan Measure left arm circumference and advise about compression sleeve wear - sleeve has been ordered, continue with kinesiotape if  pt feels it is helpful, review HEP for left shoulder strength and ROM and add as needed, encourage livestrong program   PT Home Exercise Plan Post op shoulder ROM HEP   Consulted and Agree with Plan of Care Patient      Patient will benefit from skilled therapeutic intervention in order to improve the following deficits and impairments:  Postural dysfunction, Decreased knowledge of precautions, Pain, Impaired UE functional use, Decreased range  of motion, Decreased strength, Increased edema, Decreased scar mobility  Visit Diagnosis: Lymphedema, not elsewhere classified  Muscle weakness (generalized)  Abnormal posture     Problem List Patient Active Problem List   Diagnosis Date Noted  . Genetic testing 12/05/2016  . Family history of melanoma   . Family history of stomach cancer   . Malignant neoplasm of upper-inner quadrant of left female breast ([place]) 11/09/2016  . Fibroids, submucosal 05/01/2013  . Dysmenorrhea 05/01/2013  . Anemia 05/01/2013  . Menorrhagia 04/01/2013  . Obesity (BMI 30.0-34.9) 10/31/2012    Allyson Sabal [facility] 05/07/2017, 5:10 PM  [place], [place], [address] Phone: [phone]   Fax:  [phone]  Name: Donna Matthews MRN: [id number] Date of Birth: May 16, 1976  Manus Gunning, PT 05/07/17 5:10 PM

## 2017-05-10 ENCOUNTER — Ambulatory Visit: Payer: Managed Care, Other (non HMO) | Attending: Radiation Oncology | Admitting: Physical Therapy

## 2017-05-10 ENCOUNTER — Encounter: Payer: Self-pay | Admitting: Physical Therapy

## 2017-05-10 DIAGNOSIS — M6281 Muscle weakness (generalized): Secondary | ICD-10-CM | POA: Diagnosis present

## 2017-05-10 DIAGNOSIS — R293 Abnormal posture: Secondary | ICD-10-CM | POA: Diagnosis present

## 2017-05-10 DIAGNOSIS — I890 Lymphedema, not elsewhere classified: Secondary | ICD-10-CM | POA: Diagnosis present

## 2017-05-10 NOTE — Therapy (Signed)
[place], [place], [address] Phone: [phone]   Fax:  [phone]  Physical Therapy Treatment  Patient Details  Name: Donna Matthews MRN: [id number] Date of Birth: [birth year] Referring Provider: Hayden Pedro  Encounter Date: 05/10/2017      PT End of Session - 05/10/17 0854    Visit Number 8   Number of Visits 9   Date for PT Re-Evaluation 05/16/17   PT Start Time 0803   PT Stop Time 0847   PT Time Calculation (min) 44 min   Activity Tolerance Patient tolerated treatment well   Behavior During Therapy [facility] for tasks assessed/performed      Past Medical History:  Diagnosis Date  . Anemia   . Breast cancer ([place])   . Ectopic pregnancy   . Family history of melanoma   . Family history of stomach cancer   . Fibroids   . GERD (gastroesophageal reflux disease)   . Reflux   . STD (sexually transmitted disease)    Chlamydia    Past Surgical History:  Procedure Laterality Date  . BREAST LUMPECTOMY WITH RADIOACTIVE SEED AND SENTINEL LYMPH NODE BIOPSY Left 12/18/2016   Procedure: RADIOACTIVE SEED GUIDED LEFT BREAST LUMPECTOMY WITH LEFT AXILLARY SENTINEL LYMPH NODE BIOPSY;  Surgeon: [name], MD;  Location: [facility];  Service: General;  Laterality: Left;  . DILATION AND CURETTAGE OF UTERUS      There were no vitals filed for this visit.      Subjective Assessment - 05/10/17 0804    Subjective My arm feels pretty good. I haven't noticed any changes in the swelling since I started sleeping in my compression bra.    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 It is located in the upper inner quadrant and measures 1.2 cm. It is ER/PR positive., 12/18/16 pt underwent left lumpectomy with node biopsy, pt completed chemotherapy in March 2018 and is currently taking hormone therapy   Patient Stated Goals to manage my swelling    Currently in Pain? No/denies   Pain Score 0-No pain               LYMPHEDEMA/ONCOLOGY QUESTIONNAIRE - 05/10/17 0808      Left Upper Extremity Lymphedema   15 cm Proximal to Olecranon Process 33.5 cm   10 cm Proximal to Olecranon Process 32.8 cm   Olecranon Process 27.7 cm   15 cm Proximal to Ulnar Styloid Process 26 cm   10 cm Proximal to Ulnar Styloid Process 23.3 cm   Just Proximal to Ulnar Styloid Process 18 cm   Across Hand at [employer] 19.2 cm   At [place] of 2nd Digit 5.9 cm                  [facility] Adult PT Treatment/Exercise - 05/10/17 0001      Shoulder Exercises: Supine   Horizontal ABduction Strengthening;Both;5 reps;Theraband   Theraband Level (Shoulder Horizontal ABduction) Level 1 (Yellow)   External Rotation Strengthening;Both;10 reps;Theraband   Theraband Level (Shoulder External Rotation) Level 1 (Yellow)   Flexion Strengthening;Both;10 reps;Theraband  narrow and wide grip   Theraband Level (Shoulder Flexion) Level 1 (Yellow)   Other Supine Exercises diagonal elevation with yellow theraband with both arms x 10 reps each     Shoulder Exercises: Pulleys   Flexion 2 minutes   ABduction 2 minutes     Shoulder Exercises: ROM/Strengthening   Wall  Wash with folded towel for flexion and 'windshield washer" x 10    Ball on Wall up the wall with both arms in flexion with pause at the top to stretch      Shoulder Exercises: Isometric Strengthening   Flexion 5X5"  left shoulder    Extension 5X5"  left shoulder    ABduction 5X5"  left shoulder      Manual Therapy   Manual Lymphatic Drainage (MLD) short neck, 5 diaphragmatic breaths, left inguinal nodes, establishment of left axillo inguinal pathway, right axillary nodes and establishment of interaxillary pathway, left breast moving fluid towards pathways, reestablishment of pathways                 PT Education - 05/10/17 0831    Education provided Yes   Education Details compression  sleeve wear schedule   Person(s) Educated Patient   Methods Explanation   Comprehension Verbalized understanding              Breast Clinic Goals - 11/14/16 1430      Patient will be able to verbalize understanding of pertinent lymphedema risk reduction practices relevant to her diagnosis specifically related to skin care.   Time 1   Period Days   Status Achieved     Patient will be able to return demonstrate and/or verbalize understanding of the post-op home exercise program related to regaining shoulder range of motion.   Time 1   Period Days   Status Achieved     Patient will be able to verbalize understanding of the importance of attending the postoperative After Breast Cancer Class for further lymphedema risk reduction education and therapeutic exercise.   Time 1   Period Days   Status Achieved          Long Term Clinic Goals - 05/07/17 1609      CC Long Term Goal  #1   Title Patient will be able to independently verbalize lymphedema risk reduction practices   Time 4   Period Weeks   Status On-going     CC Long Term Goal  #2   Title Pt to receive compression bra and compression sleeve for management of lymphedema from DME supplier   Baseline 05/07/17- pt has received compression bra and is awaiting on compression sleeve to arrive   Time 4   Period Weeks   Status Achieved     CC Long Term Goal  #3   Title Pt to report a 50% improvement in left breast swelling to allow for improved comfort.   Baseline 05/07/17- pt reports minimal change but reports she does see less indentations when she takes her bra off   Time 4   Period Weeks   Status On-going     CC Long Term Goal  #4   Title Pt to demonstrate 80 degrees of left shoulder external rotation to allow her to return to prior level of function   Baseline 59 degrees, 05/07/17- 82 degrees   Time 4   Period Weeks   Status Achieved     CC Long Term Goal  #5   Title Pt to be independent in home exercise  program for continued strengthening and stretching of left shoulder   Time 4   Period Weeks   Status On-going            Plan - 05/10/17 0817    Clinical Impression Statement Pt states the pulleys are getting easier. Circumferential measurements were retaken today and pt does  not demonstrate any swelling in LUE when compared to right. Her LUE gets fatigued quickly with exercises due to weakness. She is sleeping in her compression bra at night now to see if that will help decrease her swelling. She has increased fibrosis in her left medial breast today. Reviewed supine scapular exercises today. Pt states these are getting easier and are not as fatiguing as they used to be. Pt is doing these as part of her HEP. Added wall washes, windshield washer, and isometrics to HEP.    Rehab Potential Excellent   Clinical Impairments Affecting Rehab Potential none   PT Frequency 2x / week   PT Duration 4 weeks   PT Treatment/Interventions Patient/family education;Therapeutic exercise;ADLs/Self Care Home Management;Manual techniques;Manual lymph drainage;Scar mobilization;Passive range of motion;Taping   PT Next Visit Plan try red theraband with supine scap,  continue with kinesiotape if pt feels it is helpful, review HEP for left shoulder strength and ROM and add as needed, encourage livestrong program, MLD to left breast   PT Home Exercise Plan supine scapular exercises, self MLD, wall washes, windshield wipers, isometrics   Consulted and Agree with Plan of Care Patient      Patient will benefit from skilled therapeutic intervention in order to improve the following deficits and impairments:  Postural dysfunction, Decreased knowledge of precautions, Pain, Impaired UE functional use, Decreased range of motion, Decreased strength, Increased edema, Decreased scar mobility  Visit Diagnosis: Lymphedema, not elsewhere classified  Muscle weakness (generalized)  Abnormal posture     Problem  List Patient Active Problem List   Diagnosis Date Noted  . Genetic testing 12/05/2016  . Family history of melanoma   . Family history of stomach cancer   . Malignant neoplasm of upper-inner quadrant of left female breast ([place]) 11/09/2016  . Fibroids, submucosal 05/01/2013  . Dysmenorrhea 05/01/2013  . Anemia 05/01/2013  . Menorrhagia 04/01/2013  . Obesity (BMI 30.0-34.9) 10/31/2012    Allyson Sabal [facility] 05/10/2017, 8:55 AM  [place], [place], [address] Phone: [phone]   Fax:  [phone]  Name: MITSY OWEN MRN: [id number] Date of Birth: 06/02/1976  Manus Gunning, PT 05/10/17 8:55 AM

## 2017-05-14 ENCOUNTER — Ambulatory Visit: Payer: Managed Care, Other (non HMO)

## 2017-05-14 DIAGNOSIS — R293 Abnormal posture: Secondary | ICD-10-CM

## 2017-05-14 DIAGNOSIS — I890 Lymphedema, not elsewhere classified: Secondary | ICD-10-CM | POA: Diagnosis not present

## 2017-05-14 DIAGNOSIS — M6281 Muscle weakness (generalized): Secondary | ICD-10-CM

## 2017-05-14 NOTE — Therapy (Signed)
[place], [place], [address] Phone: [phone]   Fax:  [phone]  Physical Therapy Treatment  Patient Details  Name: Donna Matthews MRN: [id number] Date of Birth: Nov 30, 1976 Referring Provider: Hayden Pedro  Encounter Date: 05/14/2017      PT End of Session - 05/14/17 0901    Visit Number 9   Number of Visits 9   Date for PT Re-Evaluation 05/16/17   PT Start Time 0802   PT Stop Time 0856   PT Time Calculation (min) 54 min   Activity Tolerance Patient tolerated treatment well   Behavior During Therapy [facility] for tasks assessed/performed      Past Medical History:  Diagnosis Date  . Anemia   . Breast cancer ([place])   . Ectopic pregnancy   . Family history of melanoma   . Family history of stomach cancer   . Fibroids   . GERD (gastroesophageal reflux disease)   . Reflux   . STD (sexually transmitted disease)    Chlamydia    Past Surgical History:  Procedure Laterality Date  . BREAST LUMPECTOMY WITH RADIOACTIVE SEED AND SENTINEL LYMPH NODE BIOPSY Left 12/18/2016   Procedure: RADIOACTIVE SEED GUIDED LEFT BREAST LUMPECTOMY WITH LEFT AXILLARY SENTINEL LYMPH NODE BIOPSY;  Surgeon: [name], MD;  Location: [place];  Service: General;  Laterality: Left;  . DILATION AND CURETTAGE OF UTERUS      There were no vitals filed for this visit.      Subjective Assessment - 05/14/17 0805    Subjective Not having any pain this morning, just some discomfort in my Lt breast. Lt arm swelling still maintaining.    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 It is located in the upper inner quadrant and measures 1.2 cm. It is ER/PR positive., 12/18/16 pt underwent left lumpectomy with node biopsy, pt completed chemotherapy in March 2018 and is currently taking hormone therapy   Patient Stated Goals to manage my swelling   Currently in Pain?  No/denies                         [facility] Adult PT Treatment/Exercise - 05/14/17 0001      Shoulder Exercises: Supine   Horizontal ABduction Strengthening;Both;10 reps;Theraband   Theraband Level (Shoulder Horizontal ABduction) Level 2 (Red)   External Rotation Strengthening;Both;10 reps;Theraband   Theraband Level (Shoulder External Rotation) Level 2 (Red)   Flexion Strengthening;Both;10 reps;Theraband  Narrow and Wide Grip 10 times each   Theraband Level (Shoulder Flexion) Level 2 (Red)   Other Supine Exercises Bil D2 with red theraband 10 times each with pt returning correct therapist demonstration     Shoulder Exercises: Pulleys   Flexion 2 minutes   ABduction 2 minutes     Shoulder Exercises: Therapy Ball   ABduction 10 reps  Lt UE with side lean into end of stretch     Manual Therapy   Edema Management Issued small rectangular piece of peach medi foam with small dots in thin stockinette for pt to wear in her bra during the day as she reports the chip pack is too bulky so she has been wearing it at night only. Pt placed this at inferior/medial aspect of breast at area of most noticable fibrosis at end of session.    Manual Lymphatic Drainage (MLD) short neck, 5 diaphragmatic breaths, left inguinal nodes, establishment of left axillo  inguinal pathway, right axillary nodes and establishment of interaxillary pathway, left breast moving fluid towards pathways, reestablishment of pathways; reviewed this with pt throughout and importance of performing whole sequence throughout as she was, at times, only doing the breast portion of the massage.                       [facility] Goals - 05/07/17 1609      CC Long Term Goal  #1   Title Patient will be able to independently verbalize lymphedema risk reduction practices   Time 4   Period Weeks   Status On-going     CC Long Term Goal  #2   Title Pt to receive compression bra and compression sleeve for  management of lymphedema from DME supplier   Baseline 05/07/17- pt has received compression bra and is awaiting on compression sleeve to arrive   Time 4   Period Weeks   Status Achieved     CC Long Term Goal  #3   Title Pt to report a 50% improvement in left breast swelling to allow for improved comfort.   Baseline 05/07/17- pt reports minimal change but reports she does see less indentations when she takes her bra off   Time 4   Period Weeks   Status On-going     CC Long Term Goal  #4   Title Pt to demonstrate 80 degrees of left shoulder external rotation to allow her to return to prior level of function   Baseline 59 degrees, 05/07/17- 82 degrees   Time 4   Period Weeks   Status Achieved     CC Long Term Goal  #5   Title Pt to be independent in home exercise program for continued strengthening and stretching of left shoulder   Time 4   Period Weeks   Status On-going            Plan - 05/14/17 0903    Clinical Impression Statement Pt tolerated red theraband well with supine scapular series though did report noting increased resistance. She feels overall her UE fatigue has improved with theses exercises as she has been doing them daily. Reviewed basics of anatomy of lymphatic system with pt for emphasis on importance of performing all sequences of self MLD as pt had reported that, at times, she would only work on area of fibrosis. She verbalized understanding of this and reported would cont with correct sequencing per handout. Also pt hasn't been doing the self MLD daily so educated her on iportance of that and she agreed to do this to allow her body to get better results with decreasing the fibrotic fluid. She reported the area of fibrosis feeling much better after session today.    Rehab Potential Excellent   Clinical Impairments Affecting Rehab Potential none   PT Frequency 2x / week   PT Duration 4 weeks   PT Treatment/Interventions Patient/family education;Therapeutic  exercise;ADLs/Self Care Home Management;Manual techniques;Manual lymph drainage;Scar mobilization;Passive range of motion;Taping   PT Next Visit Plan [place] or D/C required next visit, PT to determine. Assess peach foam issued today and cont MLD to Lt breast; review red theraband with supine scap prn, review HEP for left shoulder strength and ROM and add as needed, encourage livestrong program.   Consulted and Agree with Plan of Care Patient      Patient will benefit from skilled therapeutic intervention in order to improve the following deficits and impairments:  Postural dysfunction,  Decreased knowledge of precautions, Pain, Impaired UE functional use, Decreased range of motion, Decreased strength, Increased edema, Decreased scar mobility  Visit Diagnosis: Lymphedema, not elsewhere classified  Muscle weakness (generalized)  Abnormal posture     Problem List Patient Active Problem List   Diagnosis Date Noted  . Genetic testing 12/05/2016  . Family history of melanoma   . Family history of stomach cancer   . Malignant neoplasm of upper-inner quadrant of left female breast ([place]) 11/09/2016  . Fibroids, submucosal 05/01/2013  . Dysmenorrhea 05/01/2013  . Anemia 05/01/2013  . Menorrhagia 04/01/2013  . Obesity (BMI 30.0-34.9) 10/31/2012    Otelia Limes, PTA 05/14/2017, 9:09 AM  [place], [place], [address] Phone: [phone]   Fax:  [phone]  Name: Donna Matthews MRN: [id number] Date of Birth: 09/26/1976

## 2017-05-16 ENCOUNTER — Ambulatory Visit: Payer: Managed Care, Other (non HMO) | Admitting: Physical Therapy

## 2017-05-16 ENCOUNTER — Encounter: Payer: Self-pay | Admitting: Physical Therapy

## 2017-05-16 DIAGNOSIS — I890 Lymphedema, not elsewhere classified: Secondary | ICD-10-CM | POA: Diagnosis not present

## 2017-05-16 DIAGNOSIS — M6281 Muscle weakness (generalized): Secondary | ICD-10-CM

## 2017-05-16 DIAGNOSIS — R293 Abnormal posture: Secondary | ICD-10-CM

## 2017-05-16 NOTE — Therapy (Signed)
[place], [place], [address] Phone: [phone]   Fax:  [phone]  Physical Therapy Treatment  Patient Details  Name: Donna Matthews MRN: [id number] Date of Birth: May 14, 1976 Referring Provider: Hayden Pedro  Encounter Date: 05/16/2017      PT End of Session - 05/16/17 1207    Visit Number 10   Number of Visits 17   Date for PT Re-Evaluation 06/13/17   PT Start Time 0801   PT Stop Time 7628   PT Time Calculation (min) 46 min   Activity Tolerance Patient tolerated treatment well   Behavior During Therapy [facility] for tasks assessed/performed      Past Medical History:  Diagnosis Date  . Anemia   . Breast cancer ([place])   . Ectopic pregnancy   . Family history of melanoma   . Family history of stomach cancer   . Fibroids   . GERD (gastroesophageal reflux disease)   . Reflux   . STD (sexually transmitted disease)    Chlamydia    Past Surgical History:  Procedure Laterality Date  . BREAST LUMPECTOMY WITH RADIOACTIVE SEED AND SENTINEL LYMPH NODE BIOPSY Left 12/18/2016   Procedure: RADIOACTIVE SEED GUIDED LEFT BREAST LUMPECTOMY WITH LEFT AXILLARY SENTINEL LYMPH NODE BIOPSY;  Surgeon: [name], MD;  Location: [place];  Service: General;  Laterality: Left;  . DILATION AND CURETTAGE OF UTERUS      There were no vitals filed for this visit.      Subjective Assessment - 05/16/17 0804    Subjective Yesterday I was not having a good day. I had pain and soreness yesterday going from my armpit under my breast. Today it is still very sore. I have not been having this until yesterday.    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 It is located in the upper inner quadrant and measures 1.2 cm. It is ER/PR positive., 12/18/16 pt underwent left lumpectomy with node biopsy, pt completed chemotherapy in March 2018 and is currently taking  hormone therapy   Patient Stated Goals to manage my swelling   Currently in Pain? No/denies   Pain Score 0-No pain                         [facility] Adult PT Treatment/Exercise - 05/16/17 0001      Manual Therapy   Manual Therapy Manual Lymphatic Drainage (MLD);Soft tissue mobilization;Edema management   Edema Management assessed pt's compression sleeve and gauntlet for fit, educated pt to wear the sleeve and gauntlet for the next two days to ensure no hand swelling since her gauntlet is slightly looser than her sleeve   Soft tissue mobilization to area of tightness in left trunk and inferior breast using biotone - tightness decreased following massage   Manual Lymphatic Drainage (MLD) short neck, 5 diaphragmatic breaths, left inguinal nodes, establishment of left axillo inguinal pathway, right axillary nodes and establishment of interaxillary pathway, left breast moving fluid towards pathways, reestablishment of pathways                   [facility] Goals - 05/16/17 3151      CC Long Term Goal  #1   Title Patient will be able to independently verbalize lymphedema risk reduction practices   Time 4   Period Weeks   Status Achieved     CC Long Term Goal  #  2   Title Pt to receive compression bra and compression sleeve for management of lymphedema from DME supplier   Baseline 05/07/17- pt has received compression bra and is awaiting on compression sleeve to arrive   Time 4   Period Weeks   Status Achieved     CC Long Term Goal  #3   Title Pt to report a 50% improvement in left breast swelling to allow for improved comfort.   Baseline 05/07/17- pt reports minimal change but reports she does see less indentations when she takes her bra off, 05/16/17- pt reports it has not increased but she has not noticed a significant decrease   Time 4   Period Weeks   Status On-going     CC Long Term Goal  #4   Title Pt to demonstrate 80 degrees of left shoulder external  rotation to allow her to return to prior level of function   Baseline 59 degrees, 05/07/17- 82 degrees   Time 4   Period Weeks   Status Achieved     CC Long Term Goal  #5   Title Pt to be independent in home exercise program for continued strengthening and stretching of left shoulder   Time 4   Period Weeks   Status On-going            Plan - 05/16/17 1207    Clinical Impression Statement Patient presents to PT today for increased pain in her left trunk and inferior breast. There are areas of tightness in her pec in this area. Used biotone to massage these areas and it improved. Will recert patient for another four weeks to address this new pain and tightness as well as work on decreasing her left breast lymphedema. Since pt has this new onset of pain she was educated to stop theraband exercises for today and to go back to the yellow band tomorrow. Pt did recieve her compression sleeve and gauntlet and was educated about wearing them.    Rehab Potential Excellent   Clinical Impairments Affecting Rehab Potential none   PT Frequency 2x / week   PT Duration 4 weeks   PT Treatment/Interventions Patient/family education;Therapeutic exercise;ADLs/Self Care Home Management;Manual techniques;Manual lymph drainage;Scar mobilization;Passive range of motion;Taping   PT Next Visit Plan ask about pain in lateral trunk and inferior breast, ask if she is doing scap exercises with yellow band - possibly progress to red, continue MLD to L breast   PT Home Exercise Plan supine scapular exercises, self MLD, wall washes, windshield wipers, isometrics   Consulted and Agree with Plan of Care Patient      Patient will benefit from skilled therapeutic intervention in order to improve the following deficits and impairments:  Postural dysfunction, Decreased knowledge of precautions, Pain, Impaired UE functional use, Decreased range of motion, Decreased strength, Increased edema, Decreased scar mobility  Visit  Diagnosis: Lymphedema, not elsewhere classified - Plan: PT plan of care cert/re-cert  Muscle weakness (generalized) - Plan: PT plan of care cert/re-cert  Abnormal posture - Plan: PT plan of care cert/re-cert     Problem List Patient Active Problem List   Diagnosis Date Noted  . Genetic testing 12/05/2016  . Family history of melanoma   . Family history of stomach cancer   . Malignant neoplasm of upper-inner quadrant of left female breast ([place]) 11/09/2016  . Fibroids, submucosal 05/01/2013  . Dysmenorrhea 05/01/2013  . Anemia 05/01/2013  . Menorrhagia 04/01/2013  . Obesity (BMI 30.0-34.9) 10/31/2012    Allyson Sabal  Blue 05/16/2017, 12:15 PM  [place], [place], [address] Phone: [phone]   Fax:  [phone]  Name: EMYLIA LATELLA MRN: [id number] Date of Birth: [birth year]  Manus Gunning, PT 05/16/17 12:16 PM

## 2017-05-21 ENCOUNTER — Ambulatory Visit: Payer: Managed Care, Other (non HMO)

## 2017-05-21 ENCOUNTER — Encounter (HOSPITAL_COMMUNITY): Payer: Self-pay | Admitting: Emergency Medicine

## 2017-05-21 ENCOUNTER — Ambulatory Visit (HOSPITAL_COMMUNITY)
Admission: EM | Admit: 2017-05-21 | Discharge: 2017-05-21 | Disposition: A | Discharge status: Home / Self Care | Payer: Managed Care, Other (non HMO) | Attending: Internal Medicine | Admitting: Internal Medicine

## 2017-05-21 DIAGNOSIS — R1084 Generalized abdominal pain: Secondary | ICD-10-CM

## 2017-05-21 MED ORDER — DICYCLOMINE HCL 20 MG PO TABS: 20.0000 mg | ORAL_TABLET | Freq: Two times a day (BID) | ORAL | 0 refills | Status: DC

## 2017-05-21 MED ORDER — ONDANSETRON 4 MG PO TBDP: 4.0000 mg | ORAL_TABLET | Freq: Three times a day (TID) | ORAL | 0 refills | Status: DC | PRN

## 2017-05-21 NOTE — ED Triage Notes (Signed)
Complains of abdominal pain, nausea,vomiting, diarrhea for one day.  Diarrhea x 2 today, vomiting x 1 today

## 2017-05-21 NOTE — Discharge Instructions (Signed)
You most likely have viral gastritis. I prescribed medications to help your symptoms.For Nausea, I have prescribed Zofran, take 1 tablet under the tongue every 8 hours as needed. For abdominal cramping and hyperactivity, I prescribed a medicine called Bentyl, take one tablet by mouth twice daily. Should your symptoms persist, or fail to improve, follow up with your primary care provider or return to clinic as needed. If your symptoms worsen, particularly if you develop worsened abdominal pain, fever, signs or symptoms of severe dehydration, then go to the emergency room.

## 2017-05-21 NOTE — ED Provider Notes (Signed)
CSN: [id number]     Arrival date & time 05/21/17  1006 History   First MD Initiated Contact with Patient 05/21/17 1043     Chief Complaint  Patient presents with  . Abdominal Pain   (Consider location/radiation/quality/duration/timing/severity/associated sxs/prior Treatment) The history is provided by the patient.  Abdominal Pain  Pain location:  Generalized Pain quality: aching and cramping   Pain quality: not burning, not gnawing, not heavy, no pressure, not tearing and not throbbing   Pain radiates to:  Does not radiate Pain severity:  Mild Onset quality:  Gradual Duration:  1 day Timing:  Intermittent Progression:  Unchanged Chronicity:  New Context: not alcohol use, not awakening from sleep, not eating, not recent illness, not sick contacts, not suspicious food intake and not trauma   Relieved by:  None tried Worsened by:  Nothing Ineffective treatments:  None tried Associated symptoms: chills, diarrhea, nausea and vomiting   Associated symptoms: no anorexia, no chest pain, no constipation, no cough, no dysuria, no fever and no shortness of breath   Risk factors: not pregnant and no recent hospitalization     Past Medical History:  Diagnosis Date  . Anemia   . Breast cancer ([place])   . Ectopic pregnancy   . Family history of melanoma   . Family history of stomach cancer   . Fibroids   . GERD (gastroesophageal reflux disease)   . Reflux   . STD (sexually transmitted disease)    Chlamydia   Past Surgical History:  Procedure Laterality Date  . BREAST LUMPECTOMY WITH RADIOACTIVE SEED AND SENTINEL LYMPH NODE BIOPSY Left 12/18/2016   Procedure: RADIOACTIVE SEED GUIDED LEFT BREAST LUMPECTOMY WITH LEFT AXILLARY SENTINEL LYMPH NODE BIOPSY;  Surgeon: [name], MD;  Location: [facility];  Service: General;  Laterality: Left;  . DILATION AND CURETTAGE OF UTERUS     Family History  Problem Relation Age of Onset  . Diabetes Mother   . Hypertension Mother   . Heart disease  Father   . Diabetes Sister   . Hypertension Sister   . Lung cancer Maternal Aunt   . Melanoma Maternal Uncle   . Lung cancer Maternal Uncle   . Stomach cancer Other        paternal great aunt  . Stomach cancer Cousin        father's maternal cousin  . Cancer Other        2 maternal great aunts with unknown cancer   Social History  Substance Use Topics  . Smoking status: Never Smoker  . Smokeless tobacco: Never Used  . Alcohol use No   OB History    Gravida Para Term Preterm AB Living   2       2 0   SAB TAB Ectopic Multiple Live Births   1   1         Review of Systems  Constitutional: Positive for chills. Negative for fever.  HENT: Negative.   Respiratory: Negative for cough and shortness of breath.   Cardiovascular: Negative for chest pain and palpitations.  Gastrointestinal: Positive for abdominal pain, diarrhea, nausea and vomiting. Negative for anorexia and constipation.  Genitourinary: Negative for dysuria and frequency.  Musculoskeletal: Negative.   Skin: Negative.   Neurological: Negative.   Hematological: Negative.     Allergies  Bee venom and Penicillins  Home Medications   Prior to Admission medications   Medication Sig Start Date End Date Taking? Authorizing Provider  acetaminophen (TYLENOL) 500 MG tablet Take  1,000 mg by mouth every 6 (six) hours as needed for mild pain.    [provider]  Biotin 1 MG CAPS Take 1 mg by mouth daily. 04/09/17   Nicholas Lose, MD  dicyclomine (BENTYL) 20 MG tablet Take 1 tablet (20 mg total) by mouth 2 (two) times daily. 05/21/17   Barnet Glasgow, NP  IRON PO Take 1 tablet by mouth daily.     [provider]  Multiple Vitamin (MULTIVITAMIN) tablet Take 1 tablet by mouth daily.    [provider]  ondansetron (ZOFRAN ODT) 4 MG disintegrating tablet Take 1 tablet (4 mg total) by mouth every 8 (eight) hours as needed for nausea or vomiting. 05/21/17   Barnet Glasgow, NP  tamoxifen (NOLVADEX) 20  MG tablet Take 1 tablet (20 mg total) by mouth daily. 04/09/17   Nicholas Lose, MD   Meds Ordered and Administered this Visit  Medications - No data to display  BP 119/60 (BP Location: Right Arm)   Pulse 84   Temp 98.3 F (36.8 C) (Oral)   Resp 18   LMP 05/21/2017   SpO2 100%  No data found.   Physical Exam  Constitutional: She is oriented to person, place, and time. She appears well-developed and well-nourished. No distress.  HENT:  Head: Normocephalic and atraumatic.  Right Ear: External ear normal.  Left Ear: External ear normal.  Eyes: Conjunctivae are normal.  Neck: Normal range of motion.  Cardiovascular: Normal rate and regular rhythm.   Pulmonary/Chest: Effort normal and breath sounds normal.  Abdominal: Soft. Bowel sounds are normal. She exhibits no distension. There is no tenderness.  Neurological: She is alert and oriented to person, place, and time.  Skin: Skin is warm and dry. Capillary refill takes less than 2 seconds. No rash noted. She is not diaphoretic. No erythema.  Psychiatric: She has a normal mood and affect. Her behavior is normal.  Nursing note and vitals reviewed.   Urgent Care Course     Procedures (including critical care time)  Labs Review Labs Reviewed - No data to display  Imaging Review No results found.    MDM   1. Generalized abdominal pain    Most likely viral systemic illness, abdominal exam unremarkable, with conservative treatment with Bentyl, and Zofran will help this patient. Strict follow-up guidelines provided.    Barnet Glasgow, NP 05/21/17 1104

## 2017-05-22 ENCOUNTER — Encounter (HOSPITAL_COMMUNITY): Payer: Self-pay | Admitting: Emergency Medicine

## 2017-05-22 ENCOUNTER — Observation Stay (HOSPITAL_COMMUNITY)
Admission: EM | Admit: 2017-05-22 | Discharge: 2017-05-23 | Disposition: A | Discharge status: Home / Self Care | Payer: Managed Care, Other (non HMO) | Attending: Internal Medicine | Admitting: Internal Medicine

## 2017-05-22 DIAGNOSIS — N946 Dysmenorrhea, unspecified: Secondary | ICD-10-CM | POA: Diagnosis present

## 2017-05-22 DIAGNOSIS — R7989 Other specified abnormal findings of blood chemistry: Secondary | ICD-10-CM | POA: Diagnosis present

## 2017-05-22 DIAGNOSIS — Z853 Personal history of malignant neoplasm of breast: Secondary | ICD-10-CM | POA: Insufficient documentation

## 2017-05-22 DIAGNOSIS — R739 Hyperglycemia, unspecified: Secondary | ICD-10-CM | POA: Diagnosis present

## 2017-05-22 DIAGNOSIS — D649 Anemia, unspecified: Secondary | ICD-10-CM | POA: Diagnosis not present

## 2017-05-22 DIAGNOSIS — Z79899 Other long term (current) drug therapy: Secondary | ICD-10-CM | POA: Diagnosis not present

## 2017-05-22 DIAGNOSIS — C50212 Malignant neoplasm of upper-inner quadrant of left female breast: Secondary | ICD-10-CM | POA: Diagnosis present

## 2017-05-22 LAB — COMPREHENSIVE METABOLIC PANEL
ALK PHOS: 38 U/L (ref 38–126)
ALT: 13 U/L — AB (ref 14–54)
AST: 19 U/L (ref 15–41)
Albumin: 3.4 g/dL — ABNORMAL LOW (ref 3.5–5.0)
Anion gap: 8 (ref 5–15)
BUN: 8 mg/dL (ref 6–20)
CALCIUM: 8.1 mg/dL — AB (ref 8.9–10.3)
CO2: 25 mmol/L (ref 22–32)
CREATININE: 0.63 mg/dL (ref 0.44–1.00)
Chloride: 108 mmol/L (ref 101–111)
GFR calc Af Amer: >60 mL/min (ref >60)
Glucose, Bld: 117 mg/dL — ABNORMAL HIGH (ref 65–99)
Potassium: 3.2 mmol/L — ABNORMAL LOW (ref 3.5–5.1)
Sodium: 141 mmol/L (ref 135–145)
TOTAL PROTEIN: 6.2 g/dL — AB (ref 6.5–8.1)

## 2017-05-22 LAB — CBC WITH DIFFERENTIAL/PLATELET
BASOS PCT: 0 %
Basophils Absolute: 0 10*3/uL (ref 0.0–0.1)
Eosinophils Absolute: 0 10*3/uL (ref 0.0–0.7)
Eosinophils Relative: 0 %
HCT: 17.8 % — ABNORMAL LOW (ref 36.0–46.0)
HEMOGLOBIN: 5.4 g/dL — AB (ref 12.0–15.0)
Lymphocytes Relative: 15 %
Lymphs Abs: 1.1 10*3/uL (ref 0.7–4.0)
MCH: 18.9 pg — AB (ref 26.0–34.0)
MCHC: 30.3 g/dL (ref 30.0–36.0)
MCV: 62.2 fL — AB (ref 78.0–100.0)
MONOS PCT: 10 %
Monocytes Absolute: 0.8 10*3/uL (ref 0.1–1.0)
NEUTROS ABS: 5.7 10*3/uL (ref 1.7–7.7)
Neutrophils Relative %: 75 %
Platelets: 276 10*3/uL (ref 150–400)
RBC: 2.86 MIL/uL — ABNORMAL LOW (ref 3.87–5.11)
RDW: 17.9 % — ABNORMAL HIGH (ref 11.5–15.5)
WBC: 7.6 10*3/uL (ref 4.0–10.5)

## 2017-05-22 LAB — I-STAT BETA HCG BLOOD, ED (MC, WL, AP ONLY): I-stat hCG, quantitative: <5 m[IU]/mL (ref <5)

## 2017-05-22 LAB — ABO/RH: ABO/RH(D): O POS

## 2017-05-22 LAB — PREPARE RBC (CROSSMATCH)

## 2017-05-22 MED ORDER — DICYCLOMINE HCL 20 MG PO TABS
20.0000 mg | ORAL_TABLET | Freq: Two times a day (BID) | ORAL | Status: DC
  Administered 2017-05-23 (×2): 20 mg via ORAL
  Filled 2017-05-22 (×2): qty 1

## 2017-05-22 MED ORDER — ONDANSETRON HCL 4 MG PO TABS: 4.0000 mg | ORAL_TABLET | Freq: Four times a day (QID) | ORAL | Status: DC | PRN

## 2017-05-22 MED ORDER — BIOTIN 1 MG PO CAPS: 1.0000 mg | ORAL_CAPSULE | Freq: Every day | ORAL | Status: DC

## 2017-05-22 MED ORDER — ACETAMINOPHEN 650 MG RE SUPP: 650.0000 mg | Freq: Four times a day (QID) | RECTAL | Status: DC | PRN

## 2017-05-22 MED ORDER — ADULT MULTIVITAMIN W/MINERALS CH
1.0000 | ORAL_TABLET | Freq: Every day | ORAL | Status: DC
  Administered 2017-05-23: 1 via ORAL
  Filled 2017-05-22: qty 1

## 2017-05-22 MED ORDER — ONDANSETRON HCL 4 MG/2ML IJ SOLN: 4.0000 mg | Freq: Four times a day (QID) | INTRAMUSCULAR | Status: DC | PRN

## 2017-05-22 MED ORDER — ACETAMINOPHEN 325 MG PO TABS: 650.0000 mg | ORAL_TABLET | Freq: Four times a day (QID) | ORAL | Status: DC | PRN

## 2017-05-22 MED ORDER — TAMOXIFEN CITRATE 10 MG PO TABS
20.0000 mg | ORAL_TABLET | Freq: Every day | ORAL | Status: DC
  Administered 2017-05-23: 20 mg via ORAL
  Filled 2017-05-22: qty 2

## 2017-05-22 MED ORDER — SODIUM CHLORIDE 0.9 % IV SOLN
Freq: Once | INTRAVENOUS | Status: AC
  Administered 2017-05-23: 01:00:00 via INTRAVENOUS

## 2017-05-22 MED ORDER — FERROUS SULFATE 325 (65 FE) MG PO TABS
325.0000 mg | ORAL_TABLET | Freq: Every day | ORAL | Status: DC
  Administered 2017-05-23: 325 mg via ORAL
  Filled 2017-05-22: qty 1

## 2017-05-22 MED ORDER — SODIUM CHLORIDE 0.9 % IV SOLN
10.0000 mL/h | Freq: Once | INTRAVENOUS | Status: AC
  Administered 2017-05-22: 10 mL/h via INTRAVENOUS

## 2017-05-22 NOTE — ED Triage Notes (Signed)
Pt states she went to urgent care yesterday and her dr today and they called her and told her that her Hgb was low at 5.9 and was instructed to come here  Pt states she felt dizzy yesterday

## 2017-05-22 NOTE — ED Provider Notes (Signed)
[facility] DEPT Provider Note   CSN: [id number] Arrival date & time: 05/22/17  1915     History   Chief Complaint Chief Complaint  Patient presents with  . abnormal labs    HPI Donna Matthews is a 41 y.o. female.  41 year old female presents complaining of dizziness and lightheadedness. Went to urgent care today and have blood work done which showed a hemoglobin of 5.9. She does have a history of uterine fibroids and is currently on her menstrual cycle. Denies any syncope. No chest pain. Denies any exertional dyspnea. Has a history of similar symptoms in the past requiring blood transfusion. Denies any rectal bleeding. No hematemesis.      Past Medical History:  Diagnosis Date  . Anemia   . Breast cancer ([place])   . Ectopic pregnancy   . Family history of melanoma   . Family history of stomach cancer   . Fibroids   . GERD (gastroesophageal reflux disease)   . Reflux   . STD (sexually transmitted disease)    Chlamydia    Patient Active Problem List   Diagnosis Date Noted  . Genetic testing 12/05/2016  . Family history of melanoma   . Family history of stomach cancer   . 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 Fibroids, submucosal 05/01/2013  . Dysmenorrhea 05/01/2013  . Anemia 05/01/2013  . Menorrhagia 04/01/2013  . Obesity (BMI 30.0-34.9) 10/31/2012    Past Surgical History:  Procedure Laterality Date  . BREAST LUMPECTOMY WITH RADIOACTIVE SEED AND SENTINEL LYMPH NODE BIOPSY Left 12/18/2016   Procedure: RADIOACTIVE SEED GUIDED LEFT BREAST LUMPECTOMY WITH LEFT AXILLARY SENTINEL LYMPH NODE BIOPSY;  Surgeon: [name], MD;  Location: [facility];  Service: General;  Laterality: Left;  . DILATION AND CURETTAGE OF UTERUS      OB History    Gravida Para Term Preterm AB Living   2       2 0   SAB TAB Ectopic Multiple Live Births   1   1           Home Medications    Prior to Admission medications   Medication Sig  Start Date End Date Taking? Authorizing Provider  naproxen sodium (ANAPROX) 220 MG tablet Take 220 mg by mouth 2 (two) times daily with a meal.   Yes [provider]  acetaminophen (TYLENOL) 500 MG tablet Take 1,000 mg by mouth every 6 (six) hours as needed for mild pain.    [provider]  Biotin 1 MG CAPS Take 1 mg by mouth daily. 04/09/17   Nicholas Lose, MD  dicyclomine (BENTYL) 20 MG tablet Take 1 tablet (20 mg total) by mouth 2 (two) times daily. 05/21/17   Barnet Glasgow, NP  IRON PO Take 1 tablet by mouth daily.     [provider]  Multiple Vitamin (MULTIVITAMIN) tablet Take 1 tablet by mouth daily.    [provider]  ondansetron (ZOFRAN ODT) 4 MG disintegrating tablet Take 1 tablet (4 mg total) by mouth every 8 (eight) hours as needed for nausea or vomiting. 05/21/17   Barnet Glasgow, NP  tamoxifen (NOLVADEX) 20 MG tablet Take 1 tablet (20 mg total) by mouth daily. 04/09/17   Nicholas Lose, MD    Family History Family History  Problem Relation Age of Onset  . Diabetes Mother   . Hypertension Mother   . Heart disease Father   . Heart attack Father   . Diabetes Sister   .  Hypertension Sister   . Lung cancer Maternal Aunt   . Melanoma Maternal Uncle   . Lung cancer Maternal Uncle   . Stomach cancer Other        paternal great aunt  . Stomach cancer Cousin        father's maternal cousin  . Cancer Other        2 maternal great aunts with unknown cancer    Social History Social History  Substance Use Topics  . Smoking status: Never Smoker  . Smokeless tobacco: Never Used  . Alcohol use No     Allergies   Bee venom and Penicillins   Review of Systems Review of Systems  All other systems reviewed and are negative.    Physical Exam Updated Vital Signs BP 138/74 (BP Location: Left Arm)   Pulse (!) 112   Temp 99.1 F (37.3 C) (Oral)   Resp 18   LMP 05/19/2017 (Exact Date)   SpO2 100%   Physical Exam  Constitutional:  She is oriented to person, place, and time. She appears well-developed and well-nourished.  Non-toxic appearance. No distress.  HENT:  Head: Normocephalic and atraumatic.  Eyes: Conjunctivae, EOM and lids are normal. Pupils are equal, round, and reactive to light.  Neck: Normal range of motion. Neck supple. No tracheal deviation present. No thyroid mass present.  Cardiovascular: Normal rate, regular rhythm and normal heart sounds.  Exam reveals no gallop.   No murmur heard. Pulmonary/Chest: Effort normal and breath sounds normal. No stridor. No respiratory distress. She has no decreased breath sounds. She has no wheezes. She has no rhonchi. She has no rales.  Abdominal: Soft. Normal appearance and bowel sounds are normal. She exhibits no distension. There is no tenderness. There is no rebound and no CVA tenderness.  Musculoskeletal: Normal range of motion. She exhibits no edema or tenderness.  Neurological: She is alert and oriented to person, place, and time. She has normal strength. No cranial nerve deficit or sensory deficit. GCS eye subscore is 4. GCS verbal subscore is 5. GCS motor subscore is 6.  Skin: Skin is warm and dry. No abrasion and no rash noted.  Psychiatric: She has a normal mood and affect. Her speech is normal and behavior is normal.  Nursing note and vitals reviewed.    ED Treatments / Results  Labs (all labs ordered are listed, but only abnormal results are displayed) Labs Reviewed  CBC WITH DIFFERENTIAL/PLATELET  COMPREHENSIVE METABOLIC PANEL  I-STAT BETA HCG BLOOD, ED ([facility], [facility], [facility] ONLY)  TYPE AND SCREEN    EKG  EKG Interpretation None       Radiology No results found.  Procedures Procedures (including critical care time)  Medications Ordered in ED Medications - No data to display   Initial Impression / Assessment and Plan / ED Course  I have reviewed the triage vital signs and the nursing notes.  Pertinent labs & imaging results that were available  during my care of the patient were reviewed by me and considered in my medical decision making (see chart for details).     Patient hemoglobin of 5.4 likely from her known history of fibroids. She is currently on her menstrual cycle. 2 units of packed red blood cells ordered and patient will be admitted to the hospitalist service  Final Clinical Impressions(s) / ED Diagnoses   Final diagnoses:  None    New Prescriptions New Prescriptions   No medications on file     Lacretia Leigh, MD 05/22/17  2233  

## 2017-05-22 NOTE — H&P (Signed)
History and Physical    Donna Matthews KKX:[id number] DOB: [birth year] DOA: 05/22/2017  PCP: [name], PA Consultants:  [facility]; [facility] - surgery; [facility] - oncology Patient coming from: Home -  Lives with boyfriend; NOK: mother, [phone]  Chief Complaint: abnormal lab  HPI: Donna Matthews is a 41 y.o. female with medical history significant of uterine fibroids, GERD, anemia, and breast cancer diagnosed in 11/17 and now done with treatment but on Tamoxifen presenting after having been sent by her PCP.  Yesterday, she was dizzy, hot, sweaty, and "I was using the bathroom a little bit" and vomited once, "more like spit."  Went to Urgent Care, diagnosed with a stomach virus.  Followed up with PCP today and they ordered lab work.  They called her tonight and said Hgb was low and she should come in.  She has had symptomatic anemia with IV iron (feraheme, infusion on 08/17/16) last September.  Light-headedness/dizziness has resolved.  +SOB with walking up stairs.  Has her period today, currently day 4.  Bleeds monthly, irregular in legnth, no more than 7 days but does not bleed heavily the whole 7 days.  This is a typical period for her.  Previously discussed having myomectomy but has not been to the GYN recently due to breast cancer.  Has never been on hormonal treatments to treat this in the past.  Breast cancer diagnosed in 11/17.  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  Completed radiation in March.  Started Tamoxifen on May 1.     ED Course: Hgb 5.4 thought to be due to fibroids, on menses.  Ordered 2 units PRBC.  Review of Systems: As per HPI; otherwise review of systems reviewed and negative.   Ambulatory Status:  Ambulates without assistance  Past Medical History:  Diagnosis Date  . Anemia   . Breast cancer ([place]) 10/2016  . Ectopic pregnancy   . Family history of melanoma   . Family history of stomach cancer   . Fibroids   . GERD  (gastroesophageal reflux disease)   . STD (sexually transmitted disease)    Chlamydia    Past Surgical History:  Procedure Laterality Date  . BREAST LUMPECTOMY WITH RADIOACTIVE SEED AND SENTINEL LYMPH NODE BIOPSY Left 12/18/2016   Procedure: RADIOACTIVE SEED GUIDED LEFT BREAST LUMPECTOMY WITH LEFT AXILLARY SENTINEL LYMPH NODE BIOPSY;  Surgeon: [name], MD;  Location: [facility];  Service: General;  Laterality: Left;  . DILATION AND CURETTAGE OF UTERUS      Social History   Social History  . Marital status: Single    Spouse name: N/A  . Number of children: N/A  . Years of education: N/A   Occupational History  . Customer service    Social History Main Topics  . Smoking status: Never Smoker  . Smokeless tobacco: Never Used  . Alcohol use No  . Drug use: No  . Sexual activity: Yes    Birth control/ protection: None   Other Topics Concern  . Not on file   Social History Narrative  . No narrative on file    Allergies  Allergen Reactions  . Bee Venom Hives and Swelling  . Penicillins Swelling    Has patient had a PCN reaction causing immediate rash, facial/tongue/throat swelling, SOB or lightheadedness with hypotension:unknown Has patient had a PCN reaction causing severe rash involving mucus membranes or skin necrosis: No Has patient had a PCN reaction that required hospitalization No Has patient  had a PCN reaction occurring within the last 10 years: No If all of the above answers are "NO", then may proceed with Cephalosporin use.     Family History  Problem Relation Age of Onset  . Diabetes Mother   . Hypertension Mother   . Heart disease Father   . Heart attack Father   . Diabetes Sister   . Hypertension Sister   . Lung cancer Maternal Aunt   . Melanoma Maternal Uncle   . Lung cancer Maternal Uncle   . Stomach cancer Other        paternal great aunt  . Stomach cancer Cousin        father's maternal cousin  . Cancer Other        2 maternal great aunts  with unknown cancer    Prior to Admission medications   Medication Sig Start Date End Date Taking? Authorizing Provider  Biotin 1 MG CAPS Take 1 mg by mouth daily. 04/09/17  Yes Nicholas Lose, MD  dicyclomine (BENTYL) 20 MG tablet Take 1 tablet (20 mg total) by mouth 2 (two) times daily. 05/21/17  Yes Barnet Glasgow, NP  IRON PO Take 1 tablet by mouth daily.    Yes [provider]  Multiple Vitamin (MULTIVITAMIN) tablet Take 1 tablet by mouth daily.   Yes [provider]  naproxen sodium (ANAPROX) 220 MG tablet Take 220 mg by mouth 2 (two) times daily with a meal.   Yes [provider]  ondansetron (ZOFRAN ODT) 4 MG disintegrating tablet Take 1 tablet (4 mg total) by mouth every 8 (eight) hours as needed for nausea or vomiting. 05/21/17  Yes Barnet Glasgow, NP  tamoxifen (NOLVADEX) 20 MG tablet Take 1 tablet (20 mg total) by mouth daily. 04/09/17  Yes Nicholas Lose, MD    Physical Exam: Vitals:   05/22/17 1921 05/22/17 2233 05/23/17 0008  BP: 138/74 138/76   Pulse: (!) 112 (!) 106   Resp: 18 18   Temp: 99.1 F (37.3 C) 98.3 F (36.8 C)   TempSrc: Oral Oral   SpO2: 100% 100%   Weight:   76.4 kg (168 lb 6.4 oz)  Height:   5\' 4"  (1.626 m)     General:  Appears calm and comfortable and is NAD Eyes:  PERRL, EOMI, normal lids, iris ENT:  grossly normal hearing, lips & tongue, mmm Neck:  no LAD, masses or thyromegaly Cardiovascular:  RRR, no m/r/g. No LE edema.  Respiratory:  CTA bilaterally, no w/r/r. Normal respiratory effort. Abdomen:  soft, ntnd, NABS; uterine size is close to umbilicus Skin:  no rash or induration seen on limited exam Musculoskeletal:  grossly normal tone BUE/BLE, good ROM, no bony abnormality Psychiatric:  grossly normal mood and affect, speech fluent and appropriate, AOx3 Neurologic:  CN 2-12 grossly intact, moves all extremities in coordinated fashion, sensation intact  Labs on Admission: I have personally reviewed following labs  and imaging studies  CBC:  Recent Labs Lab 05/22/17 2139  WBC 7.6  NEUTROABS 5.7  HGB 5.4*  HCT 17.8*  MCV 62.2*  PLT 809   Basic Metabolic Panel:  Recent Labs Lab 05/22/17 2139  NA 141  K 3.2*  CL 108  CO2 25  GLUCOSE 117*  BUN 8  CREATININE 0.63  CALCIUM 8.1*   GFR: Estimated Creatinine Clearance: 90.8 mL/min (by C-G formula based on SCr of 0.63 mg/dL). Liver Function Tests:  Recent Labs Lab 05/22/17 2139  AST 19  ALT 13*  ALKPHOS 38  BILITOT <0.1*  PROT 6.2*  ALBUMIN 3.4*   No results for input(s): LIPASE, AMYLASE in the last 168 hours. No results for input(s): AMMONIA in the last 168 hours. Coagulation Profile: No results for input(s): INR, PROTIME in the last 168 hours. Cardiac Enzymes: No results for input(s): CKTOTAL, CKMB, CKMBINDEX, TROPONINI in the last 168 hours. BNP (last 3 results) No results for input(s): PROBNP in the last 8760 hours. HbA1C: No results for input(s): HGBA1C in the last 72 hours. CBG: No results for input(s): GLUCAP in the last 168 hours. Lipid Profile: No results for input(s): CHOL, HDL, LDLCALC, TRIG, CHOLHDL, LDLDIRECT in the last 72 hours. Thyroid Function Tests: No results for input(s): TSH, T4TOTAL, FREET4, T3FREE, THYROIDAB in the last 72 hours. Anemia Panel: No results for input(s): VITAMINB12, FOLATE, FERRITIN, TIBC, IRON, RETICCTPCT in the last 72 hours. Urine analysis:    Component Value Date/Time   COLORURINE YELLOW 10/12/2016 1009   APPEARANCEUR CLEAR 10/12/2016 1009   LABSPEC 1.014 10/12/2016 1009   PHURINE 7.0 10/12/2016 1009   GLUCOSEU NEGATIVE 10/12/2016 1009   HGBUR NEGATIVE 10/12/2016 1009   BILIRUBINUR NEGATIVE 10/12/2016 1009   KETONESUR NEGATIVE 10/12/2016 1009   PROTEINUR NEGATIVE 10/12/2016 1009   UROBILINOGEN 0.2 03/09/2015 1201   NITRITE NEGATIVE 10/12/2016 1009   LEUKOCYTESUR NEGATIVE 10/12/2016 1009    Creatinine Clearance: Estimated Creatinine Clearance: 90.8 mL/min (by C-G formula  based on SCr of 0.63 mg/dL).  Sepsis Labs: @LABRCNTIP (procalcitonin:4,lacticidven:4) )No results found for this or any previous visit (from the past 240 hour(s)).   Radiological Exams on Admission: No results found.  EKG: not done  Assessment/Plan Principal Problem:   Symptomatic anemia Active Problems:   Dysmenorrhea   Malignant neoplasm of upper-inner quadrant of left female breast ([facility])   Hyperglycemia   Anemia -Hgb 5.4, MCV 62.2, RDW 17.9; prior Hgb 8.7 on 1/4 -MCV is <80 and so this is microcytic anemia -MCV is < 70 indicating probable iron deficiency -With an MCV <70, elevated RDW, and reason for iron deficiency - iron deficiency can be presumed -She has previously been seen about this issue by heme/onc and received IV iron infusion -Unfortunately, since she has ongoing blood loss, it is unlikely that IV iron alone will be sufficient -She is symptomatic and likely would benefit from transfusion -2u PRBC ordered by ER physician -Risks/benefits reviewed with the patient and she is in agreement with transfusion -Anticipate d/c to home after transfusion (tomorrow) assuming no complications  Dysmenorrhea -Patient with known uterine fibroids -Uterus measured 12 x 10 x 10 cm in 2016, suspect it is closer to 20 cm now -Unfortunately, she does wish to maintain option for future fertility -This is further complicated by the inability to provide hormonal therapy given her recent h/o breast cancer -Suggest referring patient for ASAP appointment to GYN after discharge -Since she is having a typical (for her) cycle and should be due to stop bleeding in the next 1-2 days, will not give IV estrogen at this time - particularly in light of recent breast cancer  Breast cancer -Appears to be in remission -On Tamoxifen - started recently and this can worse DUB/fibroids -Will defer to GYN for now  Hyperglycemia -Glucose 117 -May be stress response -Will follow with fasting AM  labs   DVT prophylaxis: SCDs Code Status: Full - confirmed with patient/family Family Communication: Family present throughout evaluation Disposition Plan:  Home once clinically improved Consults called: None  Admission status: It is my clinical opinion that referral for OBSERVATION  is reasonable and necessary in this patient based on the above information provided. The aforementioned taken together are felt to place the patient at high risk for further clinical deterioration. However it is anticipated that the patient may be medically stable for discharge from the hospital within 24 to 48 hours.    Karmen Bongo MD [facility]  If 7PM-7AM, please contact night-coverage [email or web address] Password [email or web address]  05/23/2017, 12:09 AM

## 2017-05-22 NOTE — ED Notes (Signed)
Unable to obtain blood work. Will page IV team for IV placement and blood draw.

## 2017-05-22 NOTE — Progress Notes (Signed)
PHARMACIST - PHYSICIAN ORDER COMMUNICATION  CONCERNING: P&T Medication Policy on Herbal Medications  DESCRIPTION:  This patient's order for:  Biotin  has been noted.  This product(s) is classified as an "herbal" or natural product. Due to a lack of definitive safety studies or FDA approval, nonstandard manufacturing practices, plus the potential risk of unknown drug-drug interactions while on inpatient medications, the Pharmacy and Therapeutics Committee does not permit the use of "herbal" or natural products of this type within [facility].   ACTION TAKEN: The pharmacy department is unable to verify this order at this time and your patient has been informed of this safety policy. Please reevaluate patient's clinical condition at discharge and address if the herbal or natural product(s) should be resumed at that time.   Thanks Dorrene German 05/22/2017 11:53 PM

## 2017-05-23 ENCOUNTER — Encounter (HOSPITAL_COMMUNITY): Payer: Self-pay | Admitting: Internal Medicine

## 2017-05-23 DIAGNOSIS — Z170 Estrogen receptor positive status [ER+]: Secondary | ICD-10-CM

## 2017-05-23 DIAGNOSIS — C50212 Malignant neoplasm of upper-inner quadrant of left female breast: Secondary | ICD-10-CM | POA: Diagnosis not present

## 2017-05-23 DIAGNOSIS — N946 Dysmenorrhea, unspecified: Secondary | ICD-10-CM

## 2017-05-23 DIAGNOSIS — R739 Hyperglycemia, unspecified: Secondary | ICD-10-CM | POA: Diagnosis not present

## 2017-05-23 DIAGNOSIS — D649 Anemia, unspecified: Secondary | ICD-10-CM | POA: Diagnosis not present

## 2017-05-23 LAB — BASIC METABOLIC PANEL
ANION GAP: 6 (ref 5–15)
BUN: 7 mg/dL (ref 6–20)
CHLORIDE: 110 mmol/L (ref 101–111)
CO2: 26 mmol/L (ref 22–32)
Calcium: 8.2 mg/dL — ABNORMAL LOW (ref 8.9–10.3)
Creatinine, Ser: 0.55 mg/dL (ref 0.44–1.00)
GFR calc Af Amer: >60 mL/min (ref >60)
GLUCOSE: 108 mg/dL — AB (ref 65–99)
POTASSIUM: 3.6 mmol/L (ref 3.5–5.1)
Sodium: 142 mmol/L (ref 135–145)

## 2017-05-23 MED ORDER — POTASSIUM CHLORIDE CRYS ER 20 MEQ PO TBCR
40.0000 meq | EXTENDED_RELEASE_TABLET | Freq: Once | ORAL | Status: AC
Start: 2017-05-23 — End: 2017-05-23
  Administered 2017-05-23: 40 meq via ORAL
  Filled 2017-05-23: qty 2

## 2017-05-23 MED ORDER — IRON 325 (65 FE) MG PO TABS: 1.0000 | ORAL_TABLET | Freq: Three times a day (TID) | ORAL | 0 refills | Status: AC

## 2017-05-23 NOTE — Discharge Summary (Signed)
Physician Discharge Summary  Donna Matthews ACZ:[id number] DOB: [birth year] DOA: 05/22/2017  PCP: [name], PA  Admit date: 05/22/2017 Discharge date: 05/23/2017  Admitted From: home Disposition:  home   Recommendations for Outpatient Follow-up:  1. F/u on meorrhagia and cont to encourage Gyn follow up 2. Repeat CBC in 1 month 3. 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 Anxious to go home today.   Brief Summary:  Donna Matthews is a 41 y.o. female with medical history significant of uterine fibroids, GERD, anemia and breast cancer diagnosed in 11/17  on Tamoxifen who presented after she was told that her Hb was 5.9. She complains of dizziness and is admitted for blood transfusions.   Hospital Course:  Anemia due to chronic blood loss in relation to fibroid uterus - s/p 2 U PRBC - Hb has risen to 7.7- her symptoms have resolved.  - she last received an IV Iron effusion last fall and she is going to resume oral Iron. I have recommended she take it at least 2- 3 x a day.  - I have advised her to f/u with Gyn as soon as possible to address her issues with Menorrhagia.   Breast Cancer - cont Tamoxifen  Hyperglycemia - need to be addressed by PCP-   Discharge Instructions  Discharge Instructions    Diet - low sodium heart healthy    Complete by:  As directed    Increase activity slowly    Complete by:  As directed      Allergies as of 05/23/2017      Reactions   Bee Venom Hives, Swelling   Penicillins Swelling   Has patient had a PCN reaction causing immediate rash, facial/tongue/throat swelling, SOB or lightheadedness with hypotension:unknown Has patient had a PCN reaction causing severe  rash involving mucus membranes or skin necrosis: No Has patient had a PCN reaction that required hospitalization No Has patient had a PCN reaction occurring within the last 10 years: No If all of the above answers are "NO", then may proceed with Cephalosporin use.      Medication List    STOP taking these medications   naproxen sodium 220 MG tablet Commonly known as:  ANAPROX     TAKE these medications   Biotin 1 MG Caps Take 1 mg by mouth daily.   dicyclomine 20 MG tablet Commonly known as:  BENTYL Take 1 tablet (20 mg total) by mouth 2 (two) times daily.   Iron 325 (65 Fe) MG Tabs Take 1 tablet by mouth 3 (three) times daily. What changed:  medication strength  when to take this   multivitamin tablet Take 1 tablet by mouth daily.   ondansetron 4 MG disintegrating tablet Commonly known as:  ZOFRAN ODT Take 1 tablet (4 mg total) by mouth every 8 (eight) hours as needed for nausea or vomiting.   tamoxifen 20 MG tablet Commonly known as:  NOLVADEX Take 1 tablet (20 mg total) by mouth daily.       Allergies  Allergen Reactions  . Bee Venom Hives and Swelling  . Penicillins Swelling    Has patient had a PCN reaction causing immediate rash, facial/tongue/throat swelling, SOB or  lightheadedness with hypotension:unknown Has patient had a PCN reaction causing severe rash involving mucus membranes or skin necrosis: No Has patient had a PCN reaction that required hospitalization No Has patient had a PCN reaction occurring within the last 10 years: No If all of the above answers are "NO", then may proceed with Cephalosporin use.      Procedures/Studies:     No results found.     Discharge Exam: Vitals:   05/23/17 0244 05/23/17 0432  BP: 115/71 114/70  Pulse: 92 84  Resp: 16 18  Temp: 98.4 F (36.9 C) 98.5 F (36.9 C)   Vitals:   05/23/17 0036 05/23/17 0223 05/23/17 0244 05/23/17 0432  BP: 106/61 (!) 100/56 115/71 114/70  Pulse: 83  92 84  Resp: 16 18  16 18   Temp: 98.3 F (36.8 C) 98.3 F (36.8 C) 98.4 F (36.9 C) 98.5 F (36.9 C)  TempSrc: Axillary Oral Oral Oral  SpO2: 100% 100% 100% 100%  Weight:      Height:        General: Pt is alert, awake, not in acute distress Cardiovascular: RRR, S1/S2 +, no rubs, no gallops Respiratory: CTA bilaterally, no wheezing, no rhonchi Abdominal: Soft, NT, ND, bowel sounds + Extremities: no edema, no cyanosis    The results of significant diagnostics from this hospitalization (including imaging, microbiology, ancillary and laboratory) are listed below for reference.     Microbiology: No results found for this or any previous visit (from the past 240 hour(s)).   Labs: BNP (last 3 results) No results for input(s): BNP in the last 8760 hours. Basic Metabolic Panel:  Recent Labs Lab 05/22/17 2139 05/23/17 0809  NA 141 142  K 3.2* 3.6  CL 108 110  CO2 25 26  GLUCOSE 117* 108*  BUN 8 7  CREATININE 0.63 0.55  CALCIUM 8.1* 8.2*   Liver Function Tests:  Recent Labs Lab 05/22/17 2139  AST 19  ALT 13*  ALKPHOS 38  BILITOT <0.1*  PROT 6.2*  ALBUMIN 3.4*   No results for input(s): LIPASE, AMYLASE in the last 168 hours. No results for input(s): AMMONIA in the last 168 hours. CBC:  Recent Labs Lab 05/22/17 2139 05/23/17 0809  WBC 7.6 5.1  NEUTROABS 5.7  --   HGB 5.4* 7.7*  HCT 17.8* 23.5*  MCV 62.2* 69.9*  PLT 276 228   Cardiac Enzymes: No results for input(s): CKTOTAL, CKMB, CKMBINDEX, TROPONINI in the last 168 hours. BNP: Invalid input(s): POCBNP CBG: No results for input(s): GLUCAP in the last 168 hours. D-Dimer No results for input(s): DDIMER in the last 72 hours. Hgb A1c No results for input(s): HGBA1C in the last 72 hours. Lipid Profile No results for input(s): CHOL, HDL, LDLCALC, TRIG, CHOLHDL, LDLDIRECT in the last 72 hours. Thyroid function studies No results for input(s): TSH, T4TOTAL, T3FREE, THYROIDAB in the last 72 hours.  Invalid input(s):  FREET3 Anemia work up No results for input(s): VITAMINB12, FOLATE, FERRITIN, TIBC, IRON, RETICCTPCT in the last 72 hours. Urinalysis    Component Value Date/Time   COLORURINE YELLOW 10/12/2016 1009   APPEARANCEUR CLEAR 10/12/2016 1009   LABSPEC 1.014 10/12/2016 1009   PHURINE 7.0 10/12/2016 1009   GLUCOSEU NEGATIVE 10/12/2016 1009   HGBUR NEGATIVE 10/12/2016 1009   BILIRUBINUR NEGATIVE 10/12/2016 1009   [place] 10/12/2016 1009   PROTEINUR NEGATIVE 10/12/2016 1009   UROBILINOGEN 0.2 03/09/2015 1201   NITRITE NEGATIVE 10/12/2016 1009   LEUKOCYTESUR NEGATIVE 10/12/2016 1009   Sepsis Labs  Invalid input(s): PROCALCITONIN,  WBC,  LACTICIDVEN Microbiology No results found for this or any previous visit (from the past 240 hour(s)).   Time coordinating discharge: Over 30 minutes  SIGNED:   Debbe Odea, MD  [facility] 05/23/2017, 10:44 AM Pager   If 7PM-7AM, please contact night-coverage [email or web address] Password [email or web address]

## 2017-05-23 NOTE — Progress Notes (Signed)
Pt admitted from ED to 1337, accompanied by family members. Denies dizziness/lightheadedness, pain, or nausea at this time. Ambulatory to BR. Consent signed for blood transfusion and first unit of 2 started, pt currently tolerating it well. Continue to monitor. Hortencia Conradi RN

## 2017-05-24 LAB — TYPE AND SCREEN
ABO/RH(D): O POS
Antibody Screen: NEGATIVE
Unit division: 0
Unit division: 0

## 2017-05-24 LAB — CBC
HEMATOCRIT: 23.5 % — AB (ref 36.0–46.0)
HEMOGLOBIN: 7.7 g/dL — AB (ref 12.0–15.0)
MCH: 22.9 pg — AB (ref 26.0–34.0)
MCHC: 32.8 g/dL (ref 30.0–36.0)
MCV: 69.9 fL — ABNORMAL LOW (ref 78.0–100.0)
Platelets: 228 10*3/uL (ref 150–400)
RBC: 3.36 MIL/uL — ABNORMAL LOW (ref 3.87–5.11)
RDW: 23.1 % — ABNORMAL HIGH (ref 11.5–15.5)
WBC: 5.1 10*3/uL (ref 4.0–10.5)

## 2017-05-24 LAB — BPAM RBC
BLOOD PRODUCT EXPIRATION DATE: 201807022359
Blood Product Expiration Date: 201807042359
ISSUE DATE / TIME: 201806140008
ISSUE DATE / TIME: 201806140216
UNIT TYPE AND RH: 5100
Unit Type and Rh: 5100

## 2017-05-27 ENCOUNTER — Ambulatory Visit: Payer: Managed Care, Other (non HMO) | Admitting: Physical Therapy

## 2017-05-27 ENCOUNTER — Encounter: Payer: Self-pay | Admitting: Physical Therapy

## 2017-05-27 DIAGNOSIS — M6281 Muscle weakness (generalized): Secondary | ICD-10-CM

## 2017-05-27 DIAGNOSIS — I890 Lymphedema, not elsewhere classified: Secondary | ICD-10-CM | POA: Diagnosis not present

## 2017-05-27 DIAGNOSIS — R293 Abnormal posture: Secondary | ICD-10-CM

## 2017-05-27 NOTE — Therapy (Addendum)
[facility] [place], [place], [address] Phone: [phone]   Fax:  [phone]  Physical Therapy Treatment  Patient Details  Name: Donna Matthews MRN: [id number] Date of Birth: 1976/07/05 Referring Provider: Hayden Pedro  Encounter Date: 05/27/2017      PT End of Session - 05/27/17 0945    Visit Number 11   Number of Visits 17   Date for PT Re-Evaluation 06/13/17   PT Start Time 0849   PT Stop Time 0934   PT Time Calculation (min) 45 min   Activity Tolerance Patient tolerated treatment well   Behavior During Therapy [facility] for tasks assessed/performed      Past Medical History:  Diagnosis Date  . Anemia   . Breast cancer ([place]) 10/2016  . Ectopic pregnancy   . Family history of melanoma   . Family history of stomach cancer   . Fibroids   . GERD (gastroesophageal reflux disease)   . STD (sexually transmitted disease)    Chlamydia    Past Surgical History:  Procedure Laterality Date  . BREAST LUMPECTOMY WITH RADIOACTIVE SEED AND SENTINEL LYMPH NODE BIOPSY Left 12/18/2016   Procedure: RADIOACTIVE SEED GUIDED LEFT BREAST LUMPECTOMY WITH LEFT AXILLARY SENTINEL LYMPH NODE BIOPSY;  Surgeon: [name], MD;  Location: [facility];  Service: General;  Laterality: Left;  . DILATION AND CURETTAGE OF UTERUS      There were no vitals filed for this visit.      Subjective Assessment - 05/27/17 0852    Subjective I was admitted into the hospital for low hemoglobin last week. I am doing better now. I didn't do any exercises last week.    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 It is located in the upper inner quadrant and measures 1.2 cm. It is ER/PR positive., 12/18/16 pt underwent left lumpectomy with node biopsy, pt completed chemotherapy in March 2018 and is currently taking hormone therapy   Patient Stated Goals to manage my swelling    Currently in Pain? No/denies   Pain Score 0-No pain                         [facility] Adult PT Treatment/Exercise - 05/27/17 0001      Shoulder Exercises: Supine   Horizontal ABduction Strengthening;Both;10 reps;Theraband   Theraband Level (Shoulder Horizontal ABduction) Level 1 (Yellow)   External Rotation Strengthening;Both;10 reps;Theraband   Theraband Level (Shoulder External Rotation) Level 1 (Yellow)   Flexion Strengthening;Both;10 reps;Theraband  Narrow and Wide Grip 10 times each   Theraband Level (Shoulder Flexion) Level 1 (Yellow)   Other Supine Exercises Bil D2 with yellow theraband 10 times each with pt returning correct therapist demonstration     Shoulder Exercises: Stretch   Corner Stretch 1 rep;10 seconds   Other Shoulder Stretches childs pose walking fingers to right - instructed in only, seated trunk rotation stretch, pec minor stretch with corner blocking left anterior shoulder x 10 sec holds x 1 rep each     Manual Therapy   Manual Therapy Manual Lymphatic Drainage (MLD);Soft tissue mobilization;Edema management   Soft tissue mobilization to area of tightness in left trunk and inferior breast using biotone - tightness decreased following massage   Myofascial Release to left inferior breast and areas of tightness along left chest wall inferior to breast   Manual Lymphatic Drainage (MLD) short neck, 5 diaphragmatic breaths, left inguinal nodes,  establishment of left axillo inguinal pathway, right axillary nodes and establishment of interaxillary pathway, left breast moving fluid towards pathways, reestablishment of pathways                      Breast Clinic Goals - 11/14/16 1430      Patient will be able to verbalize understanding of pertinent lymphedema risk reduction practices relevant to her diagnosis specifically related to skin care.   Time 1   Period Days   Status Achieved     Patient will be able to return demonstrate and/or  verbalize understanding of the post-op home exercise program related to regaining shoulder range of motion.   Time 1   Period Days   Status Achieved     Patient will be able to verbalize understanding of the importance of attending the postoperative After Breast Cancer Class for further lymphedema risk reduction education and therapeutic exercise.   Time 1   Period Days   Status Achieved          Long Term Clinic Goals - 05/27/17 6812      CC Long Term Goal  #1   Title Patient will be able to independently verbalize lymphedema risk reduction practices   Time 4   Period Weeks     CC Long Term Goal  #2   Title Pt to receive compression bra and compression sleeve for management of lymphedema from DME supplier   Baseline 05/07/17- pt has received compression bra and is awaiting on compression sleeve to arrive   Time 4   Period Weeks   Status Achieved     CC Long Term Goal  #3   Title Pt to report a 50% improvement in left breast swelling to allow for improved comfort.   Baseline 05/07/17- pt reports minimal change but reports she does see less indentations when she takes her bra off, 05/16/17- pt reports it has not increased but she has not noticed a significant decrease, 05/27/17- 25% improvement.   Time 4   Period Weeks   Status On-going     CC Long Term Goal  #4   Title Pt to demonstrate 80 degrees of left shoulder external rotation to allow her to return to prior level of function   Baseline 59 degrees, 05/07/17- 82 degrees   Time 4   Period Weeks   Status Achieved     CC Long Term Goal  #5   Title Pt to be independent in home exercise program for continued strengthening and stretching of left shoulder   Time 4   Period Weeks   Status On-going            Plan - 05/27/17 0945    Clinical Impression Statement Patient was unable to attend her appointments last week because she was hospitilized for anemia. She has not been able to do her exercises in the last week.  Instructed pt in various chest, trunk and pec stretches. Performed myofascial to areas of tightness in left interior breast and chest wall. Issued pt stretches to do at home to help with discomfort in this area secondary to tightness.    Rehab Potential Excellent   Clinical Impairments Affecting Rehab Potential none   PT Frequency 2x / week   PT Duration 4 weeks   PT Treatment/Interventions Patient/family education;Therapeutic exercise;ADLs/Self Care Home Management;Manual techniques;Manual lymph drainage;Scar mobilization;Passive range of motion;Taping   PT Next Visit Plan assess indep with new exercises issued today, progress to red  band for supine scap, continue myofascial to areas of tightness in left inferior breast and chest wall, MLD to L breast   PT Home Exercise Plan supine scapular exercises, self MLD, wall washes, windshield wipers, isometrics, pec minor stretch, seated chest opener stretch, corner stretch, childs pose walking fingers to side   Consulted and Agree with Plan of Care Patient      Patient will benefit from skilled therapeutic intervention in order to improve the following deficits and impairments:  Postural dysfunction, Decreased knowledge of precautions, Pain, Impaired UE functional use, Decreased range of motion, Decreased strength, Increased edema, Decreased scar mobility  Visit Diagnosis: Lymphedema, not elsewhere classified  Muscle weakness (generalized)  Abnormal posture     Problem List Patient Active Problem List   Diagnosis Date Noted  . Hyperglycemia 05/23/2017  . Symptomatic anemia 05/22/2017  . Genetic testing 12/05/2016  . Family history of melanoma   . Family history of stomach cancer   . 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 Fibroids, submucosal 05/01/2013  . Dysmenorrhea 05/01/2013  . Anemia 05/01/2013  . Menorrhagia 04/01/2013  . Obesity (BMI 30.0-34.9) 10/31/2012    Allyson Sabal  [facility] 05/27/2017, 12:25 PM  [place], [place], [address] Phone: [phone]   Fax:  [phone]  Name: CARL BLEECKER MRN: [id number] Date of Birth: Sep 04, 1976  Manus Gunning, PT 05/27/17 12:25 PM

## 2017-05-27 NOTE — Patient Instructions (Signed)
Flexibility: Corner Stretch    Standing in corner with hands just above shoulder level and feet 10-12___ inches from corner, lean forward until a comfortable stretch is felt across chest. Hold _30-60___ seconds. Repeat _3___ times per set. Do __1__ sets per session. Do __2__ sessions per day.  http://orth.exer.us/343   Copyright  VHI. All rights reserved.   Pec Stretch: Stand with left shoulder blocked by corner of wall, try to raise left arm up wall with elbow bent until you feel a stretch. Hold 30-60 seconds x 3. Do at least 2x/day.   Chest/Trunk Stretch: Sitting with right arm braced against right inner thigh, raise left arm and lean towards right until you feel a stretch, then try to turn to look over left shoulder to increase stretch. Hold 30-60 seconds x 3. Do at least 2x/day.

## 2017-05-28 ENCOUNTER — Ambulatory Visit (INDEPENDENT_AMBULATORY_CARE_PROVIDER_SITE_OTHER): Payer: Managed Care, Other (non HMO) | Admitting: Women's Health

## 2017-05-28 ENCOUNTER — Encounter: Payer: Self-pay | Admitting: Women's Health

## 2017-05-28 VITALS — BP 142/80 | Ht 64.0 in | Wt 170.0 lb

## 2017-05-28 DIAGNOSIS — R21 Rash and other nonspecific skin eruption: Secondary | ICD-10-CM

## 2017-05-28 DIAGNOSIS — D251 Intramural leiomyoma of uterus: Secondary | ICD-10-CM | POA: Diagnosis not present

## 2017-05-28 DIAGNOSIS — R350 Frequency of micturition: Secondary | ICD-10-CM | POA: Diagnosis not present

## 2017-05-28 LAB — URINALYSIS W MICROSCOPIC + REFLEX CULTURE
BACTERIA UA: NONE SEEN [HPF]
BILIRUBIN URINE: NEGATIVE
Casts: NONE SEEN [LPF]
Crystals: NONE SEEN [HPF]
Glucose, UA: NEGATIVE
HGB URINE DIPSTICK: NEGATIVE
Ketones, ur: NEGATIVE
LEUKOCYTES UA: NEGATIVE
Nitrite: NEGATIVE
PROTEIN: NEGATIVE
RBC / HPF: NONE SEEN RBC/HPF (ref <=2)
Specific Gravity, Urine: 1.01 (ref 1.001–1.035)
WBC UA: NONE SEEN WBC/HPF (ref <=5)
Yeast: NONE SEEN [HPF]
pH: 6.5 (ref 5.0–8.0)

## 2017-05-28 MED ORDER — BETAMETHASONE VALERATE 0.1 % EX OINT: 1.0000 "application " | TOPICAL_OINTMENT | Freq: Two times a day (BID) | CUTANEOUS | 0 refills | Status: DC

## 2017-05-28 NOTE — Patient Instructions (Addendum)
Endometrial Ablation Endometrial ablation is a procedure that destroys the thin inner layer of the lining of the uterus (endometrium). This procedure may be done:  To stop heavy periods.  To stop bleeding that is causing anemia.  To control irregular bleeding.  To treat bleeding caused by small tumors (fibroids) in the endometrium.  This procedure is often an alternative to major surgery, such as removal of the uterus and cervix (hysterectomy). As a result of this procedure:  You may not be able to have children. However, if you are premenopausal (you have not gone through menopause): ? You may still have a small chance of getting pregnant. ? You will need to use a reliable method of birth control after the procedure to prevent pregnancy.  You may stop having a menstrual period, or you may have only a small amount of bleeding during your period. Menstruation may return several years after the procedure.  Tell a health care provider about:  Any allergies you have.  All medicines you are taking, including vitamins, herbs, eye drops, creams, and over-the-counter medicines.  Any problems you or family members have had with the use of anesthetic medicines.  Any blood disorders you have.  Any surgeries you have had.  Any medical conditions you have. What are the risks? Generally, this is a safe procedure. However, problems may occur, including:  A hole (perforation) in the uterus or bowel.  Infection of the uterus, bladder, or vagina.  Bleeding.  Damage to other structures or organs.  An air bubble in the lung (air embolus).  Problems with pregnancy after the procedure.  Failure of the procedure.  Decreased ability to diagnose cancer in the endometrium.  What happens before the procedure?  You will have tests of your endometrium to make sure there are no pre-cancerous cells or cancer cells present.  You may have an ultrasound of the uterus.  You may be given  medicines to thin the endometrium.  Ask your health care provider about: ? Changing or stopping your regular medicines. This is especially important if you take diabetes medicines or blood thinners. ? Taking medicines such as aspirin and ibuprofen. These medicines can thin your blood. Do not take these medicines before your procedure if your doctor tells you not to.  Plan to have someone take you home from the hospital or clinic. What happens during the procedure?  You will lie on an exam table with your feet and legs supported as in a pelvic exam.  To lower your risk of infection: ? Your health care team will wash or sanitize their hands and put on germ-free (sterile) gloves. ? Your genital area will be washed with soap.  An IV tube will be inserted into one of your veins.  You will be given a medicine to help you relax (sedative).  A surgical instrument with a light and camera (resectoscope) will be inserted into your vagina and moved into your uterus. This allows your surgeon to see inside your uterus.  Endometrial tissue will be removed using one of the following methods: ? Radiofrequency. This method uses a radiofrequency-alternating electric current to remove the endometrium. ? Cryotherapy. This method uses extreme cold to freeze the endometrium. ? Heated-free liquid. This method uses a heated saltwater (saline) solution to remove the endometrium. ? Microwave. This method uses high-energy microwaves to heat up the endometrium and remove it. ? Thermal balloon. This method involves inserting a catheter with a balloon tip into the uterus. The balloon tip is   filled with heated fluid to remove the endometrium. The procedure may vary among health care providers and hospitals. What happens after the procedure?  Your blood pressure, heart rate, breathing rate, and blood oxygen level will be monitored until the medicines you were given have worn off.  As tissue healing occurs, you may  notice vaginal bleeding for 4-6 weeks after the procedure. You may also experience: ? Cramps. ? Thin, watery vaginal discharge that is light pink or brown in color. ? A need to urinate more frequently than usual. ? Nausea.  Do not drive for 24 hours if you were given a sedative.  Do not have sex or insert anything into your vagina until your health care provider approves. Summary  Endometrial ablation is done to treat the many causes of heavy menstrual bleeding.  The procedure may be done only after medications have been tried to control the bleeding.  Plan to have someone take you home from the hospital or clinic. This information is not intended to replace advice given to you by your health care provider. Make sure you discuss any questions you have with your health care provider. Document Released: 10/05/2004 Document Revised: 12/13/2016 Document Reviewed: 12/13/2016 Elsevier Interactive Patient Education  2017 [employer] Anemia, Nonspecific Anemia is a condition in which the concentration of red blood cells or hemoglobin in the blood is below normal. Hemoglobin is a substance in red blood cells that carries oxygen to the tissues of the body. Anemia results in not enough oxygen reaching these tissues. What are the causes? Common causes of anemia include:  Excessive bleeding. Bleeding may be internal or external. This includes excessive bleeding from periods (in women) or from the intestine.  Poor nutrition.  Chronic kidney, thyroid, and liver disease.  Bone marrow disorders that decrease red blood cell production.  Cancer and treatments for cancer.  HIV, AIDS, and their treatments.  Spleen problems that increase red blood cell destruction.  Blood disorders.  Excess destruction of red blood cells due to infection, medicines, and autoimmune disorders.  What are the signs or symptoms?  Minor weakness.  Dizziness.  Headache.  Palpitations.  Shortness of breath,  especially with exercise.  Paleness.  Cold sensitivity.  Indigestion.  Nausea.  Difficulty sleeping.  Difficulty concentrating. Symptoms may occur suddenly or they may develop slowly. How is this diagnosed? Additional blood tests are often needed. These help your health care provider determine the best treatment. Your health care provider will check your stool for blood and look for other causes of blood loss. How is this treated? Treatment varies depending on the cause of the anemia. Treatment can include:  Supplements of iron, vitamin W73, or folic acid.  Hormone medicines.  A blood transfusion. This may be needed if blood loss is severe.  Hospitalization. This may be needed if there is significant continual blood loss.  Dietary changes.  Spleen removal.  Follow these instructions at home: Keep all follow-up appointments. It often takes many weeks to correct anemia, and having your health care provider check on your condition and your response to treatment is very important. Get help right away if:  You develop extreme weakness, shortness of breath, or chest pain.  You become dizzy or have trouble concentrating.  You develop heavy vaginal bleeding.  You develop a rash.  You have bloody or black, tarry stools.  You faint.  You vomit up blood.  You vomit repeatedly.  You have abdominal pain.  You have a fever or persistent symptoms  for more than 2-3 days.  You have a fever and your symptoms suddenly get worse.  You are dehydrated. This information is not intended to replace advice given to you by your health care provider. Make sure you discuss any questions you have with your health care provider. Document Released: 01/03/2005 Document Revised: 05/09/2016 Document Reviewed: 05/22/2013 Elsevier Interactive Patient Education  2017 [employer].

## 2017-05-28 NOTE — Progress Notes (Signed)
Presents with several issues. Has had increased urinary frequency without pain or burning. History of anemia requiring a blood transfusion 1 week ago. Long-term history of anemia from menorrhagia from fibroids. Last ultrasound 2016. Denies vaginal discharge, fever, occasional intermittent low abdominal cramping especially with cycle, none today. 12/2016 left breast cancer ER PR positive HER-2 negative had a lumpectomy and radiation. Dr. Lindi Adie managing. Currently on tamoxifen, tolerating well. Monthly 6 day cycles first 3 days heavy changing protection every hour. Minimal change in cycles since tamoxifen.  Exam: Appears well. Appears to have a wonderful outlook on recent cancer diagnosis and ongoing treatment. Aware to avoid pregnancy on tamoxifen although had wanted to conceive. UA: Negative leukocytes, no wbc's, no bacteria.  Fibroid uterus Anemia 12/2016 left breast cancer on tamoxifen  Plan: Ultrasound will schedule. Options of possible endometrial ablation. Reviewed normality of UA. Reviewed importance of increasing iron rich foods in diet, continue iron supplement 3 times daily. Keep scheduled follow-ups.

## 2017-05-31 ENCOUNTER — Ambulatory Visit: Payer: Managed Care, Other (non HMO) | Admitting: Physical Therapy

## 2017-05-31 ENCOUNTER — Encounter: Payer: Self-pay | Admitting: Physical Therapy

## 2017-05-31 DIAGNOSIS — I890 Lymphedema, not elsewhere classified: Secondary | ICD-10-CM

## 2017-05-31 DIAGNOSIS — R293 Abnormal posture: Secondary | ICD-10-CM

## 2017-05-31 DIAGNOSIS — M6281 Muscle weakness (generalized): Secondary | ICD-10-CM

## 2017-05-31 NOTE — Patient Instructions (Signed)
BACK: Child's Pose (Sciatica)    Sit in knee-chest position and reach arms forward. Separate knees for comfort Walk hands from left to right. Hold position for 5-10___ breaths. Repeat _5__ times. Do _2__ times per day. Repeat on other side.   Copyright  VHI. All rights reserved.

## 2017-05-31 NOTE — Therapy (Signed)
[place], [place], [address] Phone: [phone]   Fax:  [phone]  Physical Therapy Treatment  Patient Details  Name: Donna Matthews MRN: [id number] Date of Birth: 08/30/1976 Referring Provider: Hayden Pedro  Encounter Date: 05/31/2017      PT End of Session - 05/31/17 0931    Visit Number 12   Number of Visits 17   Date for PT Re-Evaluation 06/13/17   PT Start Time 0850   PT Stop Time 0932   PT Time Calculation (min) 42 min   Activity Tolerance Patient tolerated treatment well   Behavior During Therapy [facility] for tasks assessed/performed      Past Medical History:  Diagnosis Date  . Anemia   . Breast cancer ([place]) 10/2016  . Ectopic pregnancy   . Family history of melanoma   . Family history of stomach cancer   . Fibroids   . GERD (gastroesophageal reflux disease)   . STD (sexually transmitted disease)    Chlamydia    Past Surgical History:  Procedure Laterality Date  . BREAST LUMPECTOMY WITH RADIOACTIVE SEED AND SENTINEL LYMPH NODE BIOPSY Left 12/18/2016   Procedure: RADIOACTIVE SEED GUIDED LEFT BREAST LUMPECTOMY WITH LEFT AXILLARY SENTINEL LYMPH NODE BIOPSY;  Surgeon: [name], MD;  Location: [facility];  Service: General;  Laterality: Left;  . DILATION AND CURETTAGE OF UTERUS      There were no vitals filed for this visit.      Subjective Assessment - 05/31/17 0851    Subjective I have been doing my stretches. I think my pain is getting better. I haven't had any of that sharp pain.    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 It is located in the upper inner quadrant and measures 1.2 cm. It is ER/PR positive., 12/18/16 pt underwent left lumpectomy with node biopsy, pt completed chemotherapy in March 2018 and is currently taking hormone therapy   Patient Stated Goals to manage my swelling   Currently in Pain?  No/denies   Pain Score 0-No pain                         [facility] Adult PT Treatment/Exercise - 05/31/17 0001      Shoulder Exercises: Supine   Horizontal ABduction Strengthening;Both;10 reps;Theraband   Theraband Level (Shoulder Horizontal ABduction) Level 2 (Red)   External Rotation Strengthening;Both;10 reps;Theraband   Theraband Level (Shoulder External Rotation) Level 2 (Red)   Flexion Strengthening;Both;10 reps;Theraband  Narrow and Wide Grip 10 times each   Theraband Level (Shoulder Flexion) Level 2 (Red)   Other Supine Exercises Bil D2 with red theraband 10 times each with pt returning correct therapist demonstration     Manual Therapy   Manual Therapy Manual Lymphatic Drainage (MLD);Soft tissue mobilization;Edema management   Soft tissue mobilization to area of tightness in left trunk and inferior breast using biotone - tightness decreased following massage   Myofascial Release to left inferior breast and areas of tightness along left chest wall inferior to breast   Manual Lymphatic Drainage (MLD) short neck, 5 diaphragmatic breaths, left inguinal nodes, establishment of left axillo inguinal pathway, right axillary nodes and establishment of interaxillary pathway, left breast moving fluid towards pathways, reestablishment of pathways                     Long Term Clinic Goals - 05/27/17 3716  CC Long Term Goal  #1   Title Patient will be able to independently verbalize lymphedema risk reduction practices   Time 4   Period Weeks     CC Long Term Goal  #2   Title Pt to receive compression bra and compression sleeve for management of lymphedema from DME supplier   Baseline 05/07/17- pt has received compression bra and is awaiting on compression sleeve to arrive   Time 4   Period Weeks   Status Achieved     CC Long Term Goal  #3   Title Pt to report a 50% improvement in left breast swelling to allow for improved comfort.   Baseline 05/07/17-  pt reports minimal change but reports she does see less indentations when she takes her bra off, 05/16/17- pt reports it has not increased but she has not noticed a significant decrease, 05/27/17- 25% improvement.   Time 4   Period Weeks   Status On-going     CC Long Term Goal  #4   Title Pt to demonstrate 80 degrees of left shoulder external rotation to allow her to return to prior level of function   Baseline 59 degrees, 05/07/17- 82 degrees   Time 4   Period Weeks   Status Achieved     CC Long Term Goal  #5   Title Pt to be independent in home exercise program for continued strengthening and stretching of left shoulder   Time 4   Period Weeks   Status On-going            Plan - 05/31/17 0900    Clinical Impression Statement Patient has been practicing most of the stretches given at last appointment at home. She states she has not had anymore sharp pain in her left inferior chest. She does feel stretching when she does the exercises. Progressed pt today to red theraband. Continued with myofascial release to areas of tightness in interior breast and chest wall. Encouraged pt to wear chip back in her compression bra at night to further assist with decreasing edema.    Rehab Potential Excellent   Clinical Impairments Affecting Rehab Potential none   PT Frequency 2x / week   PT Duration 4 weeks   PT Treatment/Interventions Patient/family education;Therapeutic exercise;ADLs/Self Care Home Management;Manual techniques;Manual lymph drainage;Scar mobilization;Passive range of motion;Taping   PT Next Visit Plan  continue myofascial to areas of tightness in left inferior breast and chest wall, MLD to L breast, assess pain, assess for discomfort using red band   PT Home Exercise Plan supine scapular exercises, self MLD, wall washes, windshield wipers, isometrics, pec minor stretch, seated chest opener stretch, corner stretch, childs pose walking fingers to side   Consulted and Agree with Plan of  Care Patient      Patient will benefit from skilled therapeutic intervention in order to improve the following deficits and impairments:  Postural dysfunction, Decreased knowledge of precautions, Pain, Impaired UE functional use, Decreased range of motion, Decreased strength, Increased edema, Decreased scar mobility  Visit Diagnosis: Lymphedema, not elsewhere classified  Muscle weakness (generalized)  Abnormal posture     Problem List Patient Active Problem List   Diagnosis Date Noted  . Hyperglycemia 05/23/2017  . Symptomatic anemia 05/22/2017  . Genetic testing 12/05/2016  . Family history of melanoma   . Family history of stomach cancer   . Malignant neoplasm of upper-inner quadrant of left female breast ([place]) 11/09/2016  . Fibroids, submucosal 05/01/2013  . Dysmenorrhea 05/01/2013  . Anemia  05/01/2013  . Menorrhagia 04/01/2013  . Obesity (BMI 30.0-34.9) 10/31/2012    Allyson Sabal [facility] 05/31/2017, 9:32 AM  [place], [place], [address] Phone: [phone]   Fax:  [phone]  Name: Donna Matthews MRN: [id number] Date of Birth: 1976-02-15  Manus Gunning, PT 05/31/17 9:33 AM

## 2017-06-03 ENCOUNTER — Ambulatory Visit: Payer: Managed Care, Other (non HMO) | Admitting: Physical Therapy

## 2017-06-03 ENCOUNTER — Encounter: Payer: Self-pay | Admitting: Physical Therapy

## 2017-06-03 DIAGNOSIS — I890 Lymphedema, not elsewhere classified: Secondary | ICD-10-CM | POA: Diagnosis not present

## 2017-06-03 DIAGNOSIS — M6281 Muscle weakness (generalized): Secondary | ICD-10-CM

## 2017-06-03 DIAGNOSIS — R293 Abnormal posture: Secondary | ICD-10-CM

## 2017-06-03 NOTE — Therapy (Signed)
[place], [place], [address] Phone: [phone]   Fax:  [phone]  Physical Therapy Treatment  Patient Details  Name: Donna Matthews MRN: [id number] Date of Birth: 1976-05-18 Referring Provider: Hayden Pedro  Encounter Date: 06/03/2017      PT End of Session - 06/03/17 0934    Visit Number 13   Number of Visits 17   Date for PT Re-Evaluation 06/13/17   PT Start Time 0849   PT Stop Time 0932   PT Time Calculation (min) 43 min   Activity Tolerance Patient tolerated treatment well   Behavior During Therapy [facility] for tasks assessed/performed      Past Medical History:  Diagnosis Date  . Anemia   . Breast cancer ([place]) 10/2016  . Ectopic pregnancy   . Family history of melanoma   . Family history of stomach cancer   . Fibroids   . GERD (gastroesophageal reflux disease)   . STD (sexually transmitted disease)    Chlamydia    Past Surgical History:  Procedure Laterality Date  . BREAST LUMPECTOMY WITH RADIOACTIVE SEED AND SENTINEL LYMPH NODE BIOPSY Left 12/18/2016   Procedure: RADIOACTIVE SEED GUIDED LEFT BREAST LUMPECTOMY WITH LEFT AXILLARY SENTINEL LYMPH NODE BIOPSY;  Surgeon: [name], MD;  Location: [facility];  Service: General;  Laterality: Left;  . DILATION AND CURETTAGE OF UTERUS      There were no vitals filed for this visit.      Subjective Assessment - 06/03/17 0851    Subjective My tightness is a lot better. When I woke up I had some tightness in my armpit but it is better now. I did not have any soreness after the red theraband last session.    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 It is located in the upper inner quadrant and measures 1.2 cm. It is ER/PR positive., 12/18/16 pt underwent left lumpectomy with node biopsy, pt completed chemotherapy in March 2018 and is currently taking hormone therapy   Patient  Stated Goals to manage my swelling   Currently in Pain? No/denies   Pain Score 0-No pain                         [facility] Adult PT Treatment/Exercise - 06/03/17 0001      Shoulder Exercises: Supine   Horizontal ABduction Strengthening;Both;10 reps;Theraband   Theraband Level (Shoulder Horizontal ABduction) Level 2 (Red)   External Rotation Strengthening;Both;10 reps;Theraband   Theraband Level (Shoulder External Rotation) Level 2 (Red)   Flexion Strengthening;Both;10 reps;Theraband  Narrow and Wide Grip 10 times each   Theraband Level (Shoulder Flexion) Level 2 (Red)   Other Supine Exercises Bil D2 with red theraband 10 times each with pt returning correct therapist demonstration     Manual Therapy   Manual Therapy Manual Lymphatic Drainage (MLD);Soft tissue mobilization;Edema management   Soft tissue mobilization to area of tightness in left trunk and inferior breast using biotone - tightness decreased following massage, left lat along lateral trunk near axilla, and left pec along front of axilla   Myofascial Release to left inferior breast and areas of tightness along left chest wall inferior to breast   Manual Lymphatic Drainage (MLD) short neck, 5 diaphragmatic breaths, left inguinal nodes, establishment of left axillo inguinal pathway, right axillary nodes and establishment of interaxillary pathway, left breast moving fluid towards pathways, reestablishment of pathways  Breast Clinic Goals - 11/14/16 1430      Patient will be able to verbalize understanding of pertinent lymphedema risk reduction practices relevant to her diagnosis specifically related to skin care.   Time 1   Period Days   Status Achieved     Patient will be able to return demonstrate and/or verbalize understanding of the post-op home exercise program related to regaining shoulder range of motion.   Time 1   Period Days   Status Achieved     Patient will be  able to verbalize understanding of the importance of attending the postoperative After Breast Cancer Class for further lymphedema risk reduction education and therapeutic exercise.   Time 1   Period Days   Status Achieved          Long Term Clinic Goals - 05/27/17 4403      CC Long Term Goal  #1   Title Patient will be able to independently verbalize lymphedema risk reduction practices   Time 4   Period Weeks     CC Long Term Goal  #2   Title Pt to receive compression bra and compression sleeve for management of lymphedema from DME supplier   Baseline 05/07/17- pt has received compression bra and is awaiting on compression sleeve to arrive   Time 4   Period Weeks   Status Achieved     CC Long Term Goal  #3   Title Pt to report a 50% improvement in left breast swelling to allow for improved comfort.   Baseline 05/07/17- pt reports minimal change but reports she does see less indentations when she takes her bra off, 05/16/17- pt reports it has not increased but she has not noticed a significant decrease, 05/27/17- 25% improvement.   Time 4   Period Weeks   Status On-going     CC Long Term Goal  #4   Title Pt to demonstrate 80 degrees of left shoulder external rotation to allow her to return to prior level of function   Baseline 59 degrees, 05/07/17- 82 degrees   Time 4   Period Weeks   Status Achieved     CC Long Term Goal  #5   Title Pt to be independent in home exercise program for continued strengthening and stretching of left shoulder   Time 4   Period Weeks   Status On-going            Plan - 06/03/17 0855    Clinical Impression Statement Patient states she has been doing the stretches at home but has not done the theraband exercises at home. Continued with red theraband today and pt felt the exercises were challenging. Continued with myofascial release to areas of tightness in inferior breast and chest wall and MLD to L breast. Added soft tissue mobilization of left  latissimus along lateral trunk and left pec near anterior axilla where increased tightness was palpated. Next session will instruct pt in Strength After Breast Cancer Program in preparation for discharge soon.    Rehab Potential Excellent   Clinical Impairments Affecting Rehab Potential none   PT Frequency 2x / week   PT Duration 4 weeks   PT Treatment/Interventions Patient/family education;Therapeutic exercise;ADLs/Self Care Home Management;Manual techniques;Manual lymph drainage;Scar mobilization;Passive range of motion;Taping   PT Next Visit Plan Instruct in Strength After Breast Cancer program   PT Home Exercise Plan supine scapular exercises, self MLD, wall washes, windshield wipers, isometrics, pec minor stretch, seated chest opener stretch, corner stretch, childs pose  walking fingers to side   Consulted and Agree with Plan of Care Patient      Patient will benefit from skilled therapeutic intervention in order to improve the following deficits and impairments:  Postural dysfunction, Decreased knowledge of precautions, Pain, Impaired UE functional use, Decreased range of motion, Decreased strength, Increased edema, Decreased scar mobility  Visit Diagnosis: Lymphedema, not elsewhere classified  Muscle weakness (generalized)  Abnormal posture     Problem List Patient Active Problem List   Diagnosis Date Noted  . Hyperglycemia 05/23/2017  . Symptomatic anemia 05/22/2017  . Genetic testing 12/05/2016  . Family history of melanoma   . Family history of stomach cancer   . Malignant neoplasm of upper-inner quadrant of left female breast ([place]) 11/09/2016  . Fibroids, submucosal 05/01/2013  . Dysmenorrhea 05/01/2013  . Anemia 05/01/2013  . Menorrhagia 04/01/2013  . Obesity (BMI 30.0-34.9) 10/31/2012    Allyson Sabal [facility] 06/03/2017, 9:35 AM  [place], [place], [address] Phone: [phone]    Fax:  [phone]  Name: Donna Matthews MRN: [id number] Date of Birth: Jan 04, 1976  Manus Gunning, PT 06/03/17 9:36 AM

## 2017-06-05 ENCOUNTER — Other Ambulatory Visit: Payer: Self-pay | Admitting: Women's Health

## 2017-06-05 ENCOUNTER — Encounter: Payer: Self-pay | Admitting: Women's Health

## 2017-06-05 ENCOUNTER — Ambulatory Visit (INDEPENDENT_AMBULATORY_CARE_PROVIDER_SITE_OTHER): Payer: Managed Care, Other (non HMO)

## 2017-06-05 ENCOUNTER — Ambulatory Visit (INDEPENDENT_AMBULATORY_CARE_PROVIDER_SITE_OTHER): Payer: Managed Care, Other (non HMO) | Admitting: Women's Health

## 2017-06-05 VITALS — BP 132/80

## 2017-06-05 DIAGNOSIS — D251 Intramural leiomyoma of uterus: Secondary | ICD-10-CM

## 2017-06-05 DIAGNOSIS — N852 Hypertrophy of uterus: Secondary | ICD-10-CM

## 2017-06-05 DIAGNOSIS — N920 Excessive and frequent menstruation with regular cycle: Secondary | ICD-10-CM | POA: Diagnosis not present

## 2017-06-05 NOTE — Patient Instructions (Signed)
Uterine Fibroids Uterine fibroids are tissue masses (tumors). They are also called leiomyomas. They can develop inside of a woman's womb (uterus). They can grow very large. Fibroids are not cancerous (benign). Most fibroids do not require medical treatment. Follow these instructions at home:  Keep all follow-up visits as told by your doctor. This is important.  Take medicines only as told by your doctor. ? If you were prescribed a hormone treatment, take the hormone medicines exactly as told. ? Do not take aspirin. It can cause bleeding.  Ask your doctor about taking iron pills and increasing the amount of dark green, leafy vegetables in your diet. These actions can help to boost your blood iron levels.  Pay close attention to your period. Tell your doctor about any changes, such as: ? Increased blood flow. This may require you to use more pads or tampons than usual per month. ? A change in the number of days that your period lasts per month. ? A change in symptoms that come with your period, such as back pain or cramping in your belly area (abdomen). Contact a doctor if:  You have pain in your back or the area between your hip bones (pelvic area) that is not controlled by medicines.  You have pain in your abdomen that is not controlled with medicines.  You have an increase in bleeding between and during periods.  You soak tampons or pads in a half hour or less.  You feel lightheaded.  You feel extra tired.  You feel weak. Get help right away if:  You pass out (faint).  You have a sudden increase in pelvic pain. This information is not intended to replace advice given to you by your health care provider. Make sure you discuss any questions you have with your health care provider. Document Released: 12/29/2010 Document Revised: 07/27/2016 Document Reviewed: 05/25/2014 Elsevier Interactive Patient Education  2018 [employer]  

## 2017-06-05 NOTE — Progress Notes (Signed)
Presents for ultrasound, has had anemia and required a blood transfusion 2 weeks ago. Has also had some increased urinary frequency without pain or burning and a negative UA. Long-term history of anemia. 10/2016 left breast cancer, ER/PR positive HER-2 negative had lumpectomy with radiation and is currently on tamoxifen. Reviewed minimal change in cycle since tamoxifen started 2 months ago. BRCA negative. History of fibroids, monthly cycles with heavy bleeding first 2-3 days with dysmenorrhea, otherwise fibroids asymptomatic. Had hoped to have children but is aware not to conceive on tamoxifen.. 2016 ultrasound 3 fibroids largest being 6.55.5 cm.  Exam: Appears well. Ultrasound: T/V and T/A anteverted enlarged uterus, intramural fibroids 56 x 54 mm, 74 x 67 x 81 mm, 46 x 57 mm, 22 x 25 mm, endometrium displaced by fibroids, fluid-filled endometrium 28 x 6 mm. Endometrium 6.7 mm. Right ovary normal. Left ovary thin-walled echo-free cyst 15 x 48 x 33 mm. Rim of ovarian tissue seen transabdominal image. Negative cul-de-sac.  Fibroid uterus/increased in size Menorrhagia/iron deficiency anemia  Plan: Ultrasound reviewed with Dr. Toney Rakes will repeat in 3 months to check for resolution of fluid-filled endometrium. Options reviewed of possible hysterectomy due to the numerous fibroids and anemia. Continue iron supplements and iron rich foods.

## 2017-06-07 ENCOUNTER — Ambulatory Visit: Payer: Managed Care, Other (non HMO) | Admitting: Physical Therapy

## 2017-06-07 DIAGNOSIS — R293 Abnormal posture: Secondary | ICD-10-CM

## 2017-06-07 DIAGNOSIS — M6281 Muscle weakness (generalized): Secondary | ICD-10-CM

## 2017-06-07 DIAGNOSIS — I890 Lymphedema, not elsewhere classified: Secondary | ICD-10-CM

## 2017-06-07 NOTE — Therapy (Signed)
[place], [place], [address] Phone: [phone]   Fax:  [phone]  Physical Therapy Treatment  Patient Details  Name: Donna Matthews MRN: [id number] Date of Birth: 05-23-1976 Referring Provider: Hayden Pedro  Encounter Date: 06/07/2017      PT End of Session - 06/07/17 0931    Visit Number 14   Number of Visits 17   Date for PT Re-Evaluation 06/13/17   PT Start Time 0847   PT Stop Time 0930   PT Time Calculation (min) 43 min   Activity Tolerance Patient tolerated treatment well   Behavior During Therapy [facility] for tasks assessed/performed      Past Medical History:  Diagnosis Date  . Anemia   . Breast cancer ([place]) 10/2016  . Ectopic pregnancy   . Family history of melanoma   . Family history of stomach cancer   . Fibroids   . GERD (gastroesophageal reflux disease)   . STD (sexually transmitted disease)    Chlamydia    Past Surgical History:  Procedure Laterality Date  . BREAST LUMPECTOMY WITH RADIOACTIVE SEED AND SENTINEL LYMPH NODE BIOPSY Left 12/18/2016   Procedure: RADIOACTIVE SEED GUIDED LEFT BREAST LUMPECTOMY WITH LEFT AXILLARY SENTINEL LYMPH NODE BIOPSY;  Surgeon: [name], MD;  Location: [place];  Service: General;  Laterality: Left;  . DILATION AND CURETTAGE OF UTERUS      There were no vitals filed for this visit.      Subjective Assessment - 06/07/17 0851    Subjective Pt has been sore after the last treatment . She has been stretching.    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 It is located in the upper inner quadrant and measures 1.2 cm. It is ER/PR positive., 12/18/16 pt underwent left lumpectomy with node biopsy, pt completed chemotherapy in March 2018 and is currently taking hormone therapy   Patient Stated Goals to manage my swelling   Currently in Pain? No/denies                          [facility] Adult PT Treatment/Exercise - 06/07/17 0001      Exercises   Exercises Other Exercises  instructed in prorgression and strength ABC to squats                 PT Education - 06/07/17 0930    Education provided Yes   Education Details [email or web address] website for lymphedema education session for reference.  How to prgress resistance exercise, Strength ABC stretches and exercise program to squats  with logging    Person(s) Educated Patient   Methods Explanation;Demonstration;Handout   Comprehension Need further instruction              Breast Clinic Goals - 11/14/16 1430      Patient will be able to verbalize understanding of pertinent lymphedema risk reduction practices relevant to her diagnosis specifically related to skin care.   Time 1   Period Days   Status Achieved     Patient will be able to return demonstrate and/or verbalize understanding of the post-op home exercise program related to regaining shoulder range of motion.   Time 1   Period Days   Status Achieved     Patient will be able to verbalize understanding of the importance of attending the postoperative After Breast Cancer Class for further lymphedema risk reduction education and therapeutic  exercise.   Time 1   Period Days   Status Achieved          Long Term Clinic Goals - 05/27/17 6599      CC Long Term Goal  #1   Title Patient will be able to independently verbalize lymphedema risk reduction practices   Time 4   Period Weeks     CC Long Term Goal  #2   Title Pt to receive compression bra and compression sleeve for management of lymphedema from DME supplier   Baseline 05/07/17- pt has received compression bra and is awaiting on compression sleeve to arrive   Time 4   Period Weeks   Status Achieved     CC Long Term Goal  #3   Title Pt to report a 50% improvement in left breast swelling to allow for improved comfort.   Baseline 05/07/17- pt  reports minimal change but reports she does see less indentations when she takes her bra off, 05/16/17- pt reports it has not increased but she has not noticed a significant decrease, 05/27/17- 25% improvement.   Time 4   Period Weeks   Status On-going     CC Long Term Goal  #4   Title Pt to demonstrate 80 degrees of left shoulder external rotation to allow her to return to prior level of function   Baseline 59 degrees, 05/07/17- 82 degrees   Time 4   Period Weeks   Status Achieved     CC Long Term Goal  #5   Title Pt to be independent in home exercise program for continued strengthening and stretching of left shoulder   Time 4   Period Weeks   Status On-going            Plan - 06/07/17 0932    Clinical Impression Statement Pt did very well with beginning education with strength ABC program.  She was able to do stretches and initial exercise with good form and speed.  She noticed increased tightness with triceps stretch and will modifiy it by putting hand on the top of her head instead of her upper back for now. Pt reports she will be doing these exercises at home and asked good questions about getting weights    Rehab Potential Excellent   Clinical Impairments Affecting Rehab Potential none   PT Frequency 2x / week   PT Treatment/Interventions Patient/family education;Therapeutic exercise;ADLs/Self Care Home Management;Manual techniques;Manual lymph drainage;Scar mobilization;Passive range of motion;Taping   PT Next Visit Plan  Review stretching sequence and core exer Continue Instruction  in Strength After Breast Cancer program starting with standing rows with prepartation to discharge next week       Patient will benefit from skilled therapeutic intervention in order to improve the following deficits and impairments:  Postural dysfunction, Decreased knowledge of precautions, Pain, Impaired UE functional use, Decreased range of motion, Decreased strength, Increased edema, Decreased  scar mobility  Visit Diagnosis: Lymphedema, not elsewhere classified  Muscle weakness (generalized)  Abnormal posture     Problem List Patient Active Problem List   Diagnosis Date Noted  . Hyperglycemia 05/23/2017  . Symptomatic anemia 05/22/2017  . Genetic testing 12/05/2016  . Family history of melanoma   . Family history of stomach cancer   . Malignant neoplasm of upper-inner quadrant of left female breast ([place]) 11/09/2016  . Fibroids, submucosal 05/01/2013  . Dysmenorrhea 05/01/2013  . Anemia 05/01/2013  . Menorrhagia 04/01/2013  . Obesity (BMI 30.0-34.9) 10/31/2012   Donato Heinz. Owens Shark,  PT  Norwood Levo 06/07/2017, 9:36 AM  [place], [place], [address] Phone: [phone]   Fax:  [phone]  Name: TANECIA MCCAY MRN: [id number] Date of Birth: 06/05/1976

## 2017-06-07 NOTE — Patient Instructions (Signed)
[email or web address] Courses Online Strength After Breast Cancer Look at the right of the page for Lymphedema Education Session  

## 2017-06-10 ENCOUNTER — Encounter: Payer: Self-pay | Admitting: Physical Therapy

## 2017-06-10 ENCOUNTER — Ambulatory Visit: Payer: Managed Care, Other (non HMO) | Attending: Radiation Oncology | Admitting: Physical Therapy

## 2017-06-10 DIAGNOSIS — R293 Abnormal posture: Secondary | ICD-10-CM | POA: Insufficient documentation

## 2017-06-10 DIAGNOSIS — M6281 Muscle weakness (generalized): Secondary | ICD-10-CM

## 2017-06-10 NOTE — Therapy (Signed)
[place], [place], [address] Phone: [phone]   Fax:  [phone]  Physical Therapy Treatment  Patient Details  Name: Donna Matthews MRN: [id number] Date of Birth: 16-Mar-1976 Referring Provider: Hayden Pedro  Encounter Date: 06/10/2017      PT End of Session - 06/10/17 1012    Visit Number 15   Number of Visits 17   Date for PT Re-Evaluation 06/13/17   PT Start Time 0936   PT Stop Time 1012   PT Time Calculation (min) 36 min   Activity Tolerance Patient tolerated treatment well   Behavior During Therapy [facility] for tasks assessed/performed      Past Medical History:  Diagnosis Date  . Anemia   . Breast cancer ([place]) 10/2016  . Ectopic pregnancy   . Family history of melanoma   . Family history of stomach cancer   . Fibroids   . GERD (gastroesophageal reflux disease)   . STD (sexually transmitted disease)    Chlamydia    Past Surgical History:  Procedure Laterality Date  . BREAST LUMPECTOMY WITH RADIOACTIVE SEED AND SENTINEL LYMPH NODE BIOPSY Left 12/18/2016   Procedure: RADIOACTIVE SEED GUIDED LEFT BREAST LUMPECTOMY WITH LEFT AXILLARY SENTINEL LYMPH NODE BIOPSY;  Surgeon: [name], MD;  Location: [place];  Service: General;  Laterality: Left;  . DILATION AND CURETTAGE OF UTERUS      There were no vitals filed for this visit.      Subjective Assessment - 06/10/17 0936    Subjective I think my swelling is about the same. I had a soreness after last time but it helped. It took a couple of days to wear off. I haven't had any more of the sharp pain.    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 It is located in the upper inner quadrant and measures 1.2 cm. It is ER/PR positive., 12/18/16 pt underwent left lumpectomy with node biopsy, pt completed chemotherapy in March 2018 and is currently taking hormone therapy   Patient  Stated Goals to manage my swelling   Currently in Pain? No/denies   Pain Score 0-No pain                         [facility] Adult PT Treatment/Exercise - 06/10/17 0001      Exercises   Exercises Other Exercises  instructed pt in entire strength ABC program- 2lb                     Long Term Clinic Goals - 06/10/17 0937      CC Long Term Goal  #1   Title Patient will be able to independently verbalize lymphedema risk reduction practices   Baseline 06/10/17- pt able to independently verbalize these   Time 4   Period Weeks   Status Achieved     CC Long Term Goal  #2   Title Pt to receive compression bra and compression sleeve for management of lymphedema from DME supplier   Baseline 05/07/17- pt has received compression bra and is awaiting on compression sleeve to arrive   Time 4   Period Weeks   Status Achieved     CC Long Term Goal  #3   Title Pt to report a 50% improvement in left breast swelling to allow for improved comfort.   Baseline 05/07/17- pt reports minimal change but reports she  does see less indentations when she takes her bra off, 05/16/17- pt reports it has not increased but she has not noticed a significant decrease, 05/27/17- 25% improvement.   Time 4   Period Weeks   Status On-going     CC Long Term Goal  #4   Title Pt to demonstrate 80 degrees of left shoulder external rotation to allow her to return to prior level of function   Baseline 59 degrees, 05/07/17- 82 degrees   Time 4   Period Weeks   Status Achieved     CC Long Term Goal  #5   Title Pt to be independent in home exercise program for continued strengthening and stretching of left shoulder   Time 4   Period Weeks   Status On-going            Plan - 06/10/17 1013    Clinical Impression Statement Instructed pt in entire strength ABC program. All stretches were held for 15 sec and strenthening exercises were performed with 2lb weights x 10 reps each. Pt encouraged to go  through the program daily. Next session will increase supine scap series to green band and assess for indep with Strength ABC program. Pt may be ready for d/c at that visit.    Rehab Potential Excellent   Clinical Impairments Affecting Rehab Potential none   PT Frequency 2x / week   PT Duration 4 weeks   PT Treatment/Interventions Patient/family education;Therapeutic exercise;ADLs/Self Care Home Management;Manual techniques;Manual lymph drainage;Scar mobilization;Passive range of motion;Taping   PT Next Visit Plan assess indep with strength ABC program, progress to green theraband with supine scap- d/c next visit   PT Home Exercise Plan supine scapular exercises, self MLD, wall washes, windshield wipers, isometrics, pec minor stretch, seated chest opener stretch, corner stretch, childs pose walking fingers to side, strength ABC   Consulted and Agree with Plan of Care Patient      Patient will benefit from skilled therapeutic intervention in order to improve the following deficits and impairments:  Postural dysfunction, Decreased knowledge of precautions, Pain, Impaired UE functional use, Decreased range of motion, Decreased strength, Increased edema, Decreased scar mobility  Visit Diagnosis: Muscle weakness (generalized)  Abnormal posture     Problem List Patient Active Problem List   Diagnosis Date Noted  . Hyperglycemia 05/23/2017  . Symptomatic anemia 05/22/2017  . Genetic testing 12/05/2016  . Family history of melanoma   . Family history of stomach cancer   . Malignant neoplasm of upper-inner quadrant of left female breast ([place]) 11/09/2016  . Fibroids, submucosal 05/01/2013  . Dysmenorrhea 05/01/2013  . Anemia 05/01/2013  . Menorrhagia 04/01/2013  . Obesity (BMI 30.0-34.9) 10/31/2012    Allyson Sabal [facility] 06/10/2017, 10:15 AM  [place], [place], [address] Phone: [phone]   Fax:   [phone]  Name: ZENA VITELLI MRN: [id number] Date of Birth: 21-Feb-1976  Manus Gunning, PT 06/10/17 10:15 AM

## 2017-06-14 ENCOUNTER — Ambulatory Visit: Payer: Managed Care, Other (non HMO) | Admitting: Physical Therapy

## 2017-06-14 ENCOUNTER — Encounter: Payer: Self-pay | Admitting: Physical Therapy

## 2017-06-14 DIAGNOSIS — R293 Abnormal posture: Secondary | ICD-10-CM

## 2017-06-14 DIAGNOSIS — M6281 Muscle weakness (generalized): Secondary | ICD-10-CM

## 2017-06-14 NOTE — Therapy (Signed)
[place], [place], [address] Phone: [phone]   Fax:  [phone]  Physical Therapy Treatment  Patient Details  Name: Donna Matthews MRN: [id number] Date of Birth: [birth year] Referring Provider: Hayden Pedro  Encounter Date: 06/14/2017      PT End of Session - 06/14/17 0910    Visit Number 16   Number of Visits 17   Date for PT Re-Evaluation 06/13/17   PT Start Time 0849   PT Stop Time 0928   PT Time Calculation (min) 39 min   Activity Tolerance Patient tolerated treatment well   Behavior During Therapy [facility] for tasks assessed/performed      Past Medical History:  Diagnosis Date  . Anemia   . Breast cancer ([place]) 10/2016  . Ectopic pregnancy   . Family history of melanoma   . Family history of stomach cancer   . Fibroids   . GERD (gastroesophageal reflux disease)   . STD (sexually transmitted disease)    Chlamydia    Past Surgical History:  Procedure Laterality Date  . BREAST LUMPECTOMY WITH RADIOACTIVE SEED AND SENTINEL LYMPH NODE BIOPSY Left 12/18/2016   Procedure: RADIOACTIVE SEED GUIDED LEFT BREAST LUMPECTOMY WITH LEFT AXILLARY SENTINEL LYMPH NODE BIOPSY;  Surgeon: [name], MD;  Location: [place];  Service: General;  Laterality: Left;  . DILATION AND CURETTAGE OF UTERUS      There were no vitals filed for this visit.      Subjective Assessment - 06/14/17 0852    Subjective My breast gets full sometimes but it does not stay full. My exercises are going well.    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 It is located in the upper inner quadrant and measures 1.2 cm. It is ER/PR positive., 12/18/16 pt underwent left lumpectomy with node biopsy, pt completed chemotherapy in March 2018 and is currently taking hormone therapy   Patient Stated Goals to manage my swelling   Currently in Pain? No/denies   Pain Score  0-No pain                         [facility] Adult PT Treatment/Exercise - 06/14/17 0001      Exercises   Exercises Other Exercises  instructed pt in entire strength ABC program- 2lb                      Breast Clinic Goals - 11/14/16 1430      Patient will be able to verbalize understanding of pertinent lymphedema risk reduction practices relevant to her diagnosis specifically related to skin care.   Time 1   Period Days   Status Achieved     Patient will be able to return demonstrate and/or verbalize understanding of the post-op home exercise program related to regaining shoulder range of motion.   Time 1   Period Days   Status Achieved     Patient will be able to verbalize understanding of the importance of attending the postoperative After Breast Cancer Class for further lymphedema risk reduction education and therapeutic exercise.   Time 1   Period Days   Status Achieved          Long Term Clinic Goals - 06/14/17 6568      CC Long Term Goal  #1   Title Patient will be able to independently verbalize lymphedema risk reduction practices  Baseline 06/10/17- pt able to independently verbalize these   Time 4   Period Weeks   Status Achieved     CC Long Term Goal  #2   Title Pt to receive compression bra and compression sleeve for management of lymphedema from DME supplier   Baseline 05/07/17- pt has received compression bra and is awaiting on compression sleeve to arrive   Time 4   Period Weeks   Status Achieved     CC Long Term Goal  #3   Title Pt to report a 50% improvement in left breast swelling to allow for improved comfort.   Baseline 05/07/17- pt reports minimal change but reports she does see less indentations when she takes her bra off, 05/16/17- pt reports it has not increased but she has not noticed a significant decrease, 05/27/17- 25% improvement., 06/14/17- 25% pt is managing swelling with self drainage, compression bra, chip pack    Time 4   Period Weeks   Status Partially Met     CC Long Term Goal  #4   Title Pt to demonstrate 80 degrees of left shoulder external rotation to allow her to return to prior level of function   Baseline 59 degrees, 05/07/17- 82 degrees   Time 4   Period Weeks   Status Achieved     CC Long Term Goal  #5   Title Pt to be independent in home exercise program for continued strengthening and stretching of left shoulder   Time 4   Period Weeks   Status Achieved            Plan - 06/14/17 0911    Clinical Impression Statement Patient has met all goals for therapy at this time. She has full ROM of her shoulder. She is now independent in a home exercise program for continued strengthening and stretching. She is able to manage her left breast lymphedema through use of a compression bra, chip pack and self manual lymphatic drainage. Pt will be discharged at this time.    Rehab Potential Excellent   Clinical Impairments Affecting Rehab Potential none   PT Frequency 2x / week   PT Duration 4 weeks   PT Treatment/Interventions Patient/family education;Therapeutic exercise;ADLs/Self Care Home Management;Manual techniques;Manual lymph drainage;Scar mobilization;Passive range of motion;Taping   PT Next Visit Plan dc this visit   PT Home Exercise Plan supine scapular exercises, self MLD, wall washes, windshield wipers, isometrics, pec minor stretch, seated chest opener stretch, corner stretch, childs pose walking fingers to side, strength ABC   Consulted and Agree with Plan of Care Patient      Patient will benefit from skilled therapeutic intervention in order to improve the following deficits and impairments:  Postural dysfunction, Decreased knowledge of precautions, Pain, Impaired UE functional use, Decreased range of motion, Decreased strength, Increased edema, Decreased scar mobility  Visit Diagnosis: Muscle weakness (generalized)  Abnormal posture     Problem List Patient Active  Problem List   Diagnosis Date Noted  . Hyperglycemia 05/23/2017  . Symptomatic anemia 05/22/2017  . Genetic testing 12/05/2016  . Family history of melanoma   . Family history of stomach cancer   . Malignant neoplasm of upper-inner quadrant of left female breast ([facility]) 11/09/2016  . Fibroids, submucosal 05/01/2013  . Dysmenorrhea 05/01/2013  . Anemia 05/01/2013  . Menorrhagia 04/01/2013  . Obesity (BMI 30.0-34.9) 10/31/2012    Milagros Loll [facility] 06/14/2017, 9:32 AM  [facility] [address] [place], [place], [address] Phone:  [phone]   Fax:  [phone]  Name: Donna Matthews MRN: [id number] Date of Birth: 1976/04/27  PHYSICAL THERAPY DISCHARGE SUMMARY  Visits from Start of Care: 16  Current functional level related to goals / functional outcomes: Pt indep in self manual lymphatic drainage for left breast, independent in home exercise program, received compression bra   Remaining deficits: Some left breast swelling remains but is not getting worse and pt is able to manage independently   Education / Equipment: HEP, compression bra Plan: Patient agrees to discharge.  Patient goals were met. Patient is being discharged due to meeting the stated rehab goals.  ?????    Allyson Sabal [place], [place] 06/14/17 9:33 AM

## 2017-06-23 NOTE — Progress Notes (Signed)
CLINIC:  Survivorship   REASON FOR VISIT:  Routine follow-up post-treatment for a recent history of breast cancer.  BRIEF ONCOLOGIC HISTORY:    Malignant neoplasm of upper-inner quadrant of left female breast ([place])   11/07/2016 Initial Diagnosis    Left breast biopsy UIQ: DCIS with necrosis and calcifications grade 2-3, suspicion for microscopic invasion, ER 95%, PR 95%, Tis N0 stage 0; screening tomo detected left breast calcifications 1.2 cm      12/02/2016 Genetic Testing    Negative genetic testing on the Common HEreditary cancer panel.  The Hereditary Gene Panel offered by [facility] includes sequencing and/or deletion duplication testing of the following 43 genes: APC, ATM, AXIN2, BARD1, BMPR1A, BRCA1, BRCA2, BRIP1, CDH1, CDKN2A (p14ARF), CDKN2A (p16INK4a), CHEK2, DICER1, EPCAM (Deletion/duplication testing only), GREM1 (promoter region deletion/duplication testing only), KIT, MEN1, MLH1, MSH2, MSH6, MUTYH, NBN, NF1, PALB2, PDGFRA, PMS2, POLD1, POLE, PTEN, RAD50, RAD51C, RAD51D, SDHB, SDHC, SDHD, SMAD4, SMARCA4. STK11, TP53, TSC1, TSC2, and VHL.  The following gene was evaluated for sequence changes only: SDHA and HOXB13 c.251G>A.  The report date is December 02, 2016.      12/18/2016 Surgery    Left Lumpectomy: HG DCIS 1.2 cm, Margins Neg, 0/7 LN Neg; TisN0 (Stage 0)      01/21/2017 - 03/06/2017 Radiation Therapy    Adjuvant radiation therapy [facility]): 1) Left Breast: 50.4 Gy in 28 fractions.  2) Left Breast Boost: 10 Gy in 5 fractions.      04/09/2017 -  Anti-estrogen oral therapy    Tamoxifen 20 mg daily       INTERVAL HISTORY:  Ms. Sturgill presents to the [facility] today for our initial meeting to review her survivorship care plan detailing her treatment course for breast cancer, as well as monitoring long-term side effects of that treatment, education regarding health maintenance, screening, and overall wellness and health promotion.     Overall, Ms. Neils reports  feeling quite well.  She is fearful and anxious about a possible recurrence.  She does have some heavy vaginal bleeding and fibroids that her GYN is following and managing.  She is anticipating a possible D & C or myomectomy in the future.  Her gynecologist is aware that she is taking Tamoxifen.      REVIEW OF SYSTEMS:  Review of Systems  Constitutional: Negative for appetite change, chills, fatigue, fever and unexpected weight change.  HENT:   Negative for hearing loss and lump/mass.   Eyes: Negative for eye problems and icterus.  Respiratory: Negative for chest tightness, cough, shortness of breath and wheezing.   Cardiovascular: Negative for chest pain, leg swelling and palpitations.  Gastrointestinal: Negative for abdominal distention, abdominal pain, constipation, diarrhea, nausea and vomiting.  Endocrine: Negative for hot flashes.  Genitourinary: Positive for menstrual problem and vaginal bleeding. Negative for difficulty urinating.   Musculoskeletal: Negative for arthralgias.  Skin: Negative for itching and rash.  Neurological: Negative for dizziness, extremity weakness, headaches and numbness.  Hematological: Negative for adenopathy. Does not bruise/bleed easily.  Psychiatric/Behavioral: Negative for depression. The patient is nervous/anxious.    Breast: Denies any new nodularity, masses, tenderness, nipple changes, or nipple discharge.      ONCOLOGY TREATMENT TEAM:  1. Surgeon:  Dr. Brantley Stage at [facility] 2. Medical Oncologist: Dr. Lindi Adie  3. Radiation Oncologist: Dr. Lisbeth Renshaw    PAST MEDICAL/SURGICAL HISTORY:  Past Medical History:  Diagnosis Date  . Anemia   . Breast cancer ([place]) 10/2016  . Ectopic pregnancy   . Family  history of melanoma   . Family history of stomach cancer   . Fibroids   . GERD (gastroesophageal reflux disease)   . STD (sexually transmitted disease)    Chlamydia   Past Surgical History:  Procedure Laterality Date  . BREAST  LUMPECTOMY WITH RADIOACTIVE SEED AND SENTINEL LYMPH NODE BIOPSY Left 12/18/2016   Procedure: RADIOACTIVE SEED GUIDED LEFT BREAST LUMPECTOMY WITH LEFT AXILLARY SENTINEL LYMPH NODE BIOPSY;  Surgeon: [name], MD;  Location: [place] [facility];  Service: General;  Laterality: Left;  . DILATION AND CURETTAGE OF UTERUS       ALLERGIES:  Allergies  Allergen Reactions  . Bee Venom Hives and Swelling  . Penicillins Swelling    Has patient had a PCN reaction causing immediate rash, facial/tongue/throat swelling, SOB or lightheadedness with hypotension:unknown Has patient had a PCN reaction causing severe rash involving mucus membranes or skin necrosis: No Has patient had a PCN reaction that required hospitalization No Has patient had a PCN reaction occurring within the last 10 years: No If all of the above answers are "NO", then may proceed with Cephalosporin use.      CURRENT MEDICATIONS:  Outpatient Encounter Prescriptions as of 06/24/2017  Medication Sig  . betamethasone valerate ointment (VALISONE) 0.1 % Apply 1 application topically 2 (two) times daily.  . Biotin 1 MG CAPS Take 1 mg by mouth daily.  Marland Kitchen dicyclomine (BENTYL) 20 MG tablet Take 1 tablet (20 mg total) by mouth 2 (two) times daily.  . Ferrous Sulfate (IRON) 325 (65 Fe) MG TABS Take 1 tablet by mouth 3 (three) times daily.  . Multiple Vitamin (MULTIVITAMIN) tablet Take 1 tablet by mouth daily.  . ondansetron (ZOFRAN ODT) 4 MG disintegrating tablet Take 1 tablet (4 mg total) by mouth every 8 (eight) hours as needed for nausea or vomiting.  . tamoxifen (NOLVADEX) 20 MG tablet Take 1 tablet (20 mg total) by mouth daily.   No facility-administered encounter medications on file as of 06/24/2017.      ONCOLOGIC FAMILY HISTORY:  Family History  Problem Relation Age of Onset  . Diabetes Mother   . Hypertension Mother   . Heart disease Father   . Heart attack Father   . Diabetes Sister   . Hypertension Sister   . Lung cancer Maternal  Aunt   . Melanoma Maternal Uncle   . Lung cancer Maternal Uncle   . Stomach cancer Other        paternal great aunt  . Stomach cancer Cousin        father's maternal cousin  . Cancer Other        2 maternal great aunts with unknown cancer     GENETIC COUNSELING/TESTING: See above  SOCIAL HISTORY:  KHUSHI ZUPKO is in a relationship and lives with her boyfriend in [place], [place].  She has no children, but her mother lives in [place] as well.  Ms. Teti is currently working full time for [employer].  She denies any current or history of tobacco, alcohol, or illicit drug use.     PHYSICAL EXAMINATION:  Vital Signs:   Vitals:   06/24/17 1056  BP: (!) 118/59  Pulse: 97  Resp: 17  Temp: 98.4 F (36.9 C)   Filed Weights   06/24/17 1056  Weight: 171 lb 8 oz (77.8 kg)   General: Well-nourished, well-appearing female in no acute distress.  She is unaccompanied today.   HEENT: Head is normocephalic.  Pupils equal and reactive to light. Conjunctivae clear  without exudate.  Sclerae anicteric. Oral mucosa is pink, moist.  Oropharynx is pink without lesions or erythema.  Lymph: No cervical, supraclavicular, or infraclavicular lymphadenopathy noted on palpation.  Cardiovascular: Regular rate and rhythm.Marland Kitchen Respiratory: Clear to auscultation bilaterally. Chest expansion symmetric; breathing non-labored.  Breasts: right breast without any skin changes, nipple changes, masses, or nodules, left breast s/p lumpectomy, slightly hyperpigmented and thick, radiation changes noted, benign bilateral breast exam GI: Abdomen soft and round; non-tender, non-distended. Bowel sounds normoactive.  GU: Deferred.  Neuro: No focal deficits. Steady gait.  Psych: Mood and affect normal and appropriate for situation.  Extremities: No edema. MSK: No focal spinal tenderness to palpation.  Full range of motion in bilateral upper extremities Skin: Warm and dry.  LABORATORY DATA:  None for this  visit.  DIAGNOSTIC IMAGING:  None for this visit.      ASSESSMENT AND PLAN:  Ms.. Royse is a pleasant 41 y.o. female with Stage 0 left breast DCIS, ER+/PR+, diagnosed in 10/2016, treated with lumpectomy, adjuvant radiation therapy, and anti-estrogen therapy with Tamoxifen beginning in 04/2017.  She presents to the Survivorship Clinic for our initial meeting and routine follow-up post-completion of treatment for breast cancer.    1. Stage 0 left breast cancer:  Ms. Coronado is continuing to recover from definitive treatment for breast cancer. She will follow-up with her medical oncologist, Dr. Lindi Adie in August, 2018 with history and physical exam per surveillance protocol.  Since we did a breast exam today and she appears to be well, I offered to move her appt with Dr. Lindi Adie further out, however she stated that she would really like to keep it in August, as she is anxious about recurrence.  She will continue her anti-estrogen therapy with Tamoxifen for now. Today, a comprehensive survivorship care plan and treatment summary was reviewed with the patient today detailing her breast cancer diagnosis, treatment course, potential late/long-term effects of treatment, appropriate follow-up care with recommendations for the future, and patient education resources.  A copy of this summary, along with a letter will be sent to the patient's primary care provider via mail/fax/In Basket message after today's visit.    2. Dysfunctional Uterine Bleeding:  Patient has h/o fibroids, and her GYN is following and managing this.  We will have to watch this closely, as side effect of Tamoxifen can rarely be endometrial cancer.  Patient is aware of this.    3. Bone health:  Given Ms. Raetz's history of breast cancer  she is at slight risk for bone demineralization. She was given education on specific activities to promote bone health.  She is also taking Tamoxifen which will help improve her bone strength.    4. Cancer  screening:  Due to Ms. Schnee's history and her age, she should receive screening for skin cancers, colon cancer, and gynecologic cancers.  The information and recommendations are listed on the patient's comprehensive care plan/treatment summary and were reviewed in detail with the patient.    5. Health maintenance and wellness promotion: Ms. Koch was encouraged to consume 5-7 servings of fruits and vegetables per day. We reviewed the "Nutrition Rainbow" handout, as well as the handout "Take Control of Your Health and Reduce Your Cancer Risk" from the [place].  She was also encouraged to engage in moderate to vigorous exercise for 30 minutes per day most days of the week. We discussed the [employer] fitness program, which is designed for cancer survivors to help them become more physically fit after  cancer treatments.  She was instructed to limit her alcohol consumption and continue to abstain from tobacco use.     6. Support services/counseling: It is not uncommon for this period of the patient's cancer care trajectory to be one of many emotions and stressors.  We discussed an opportunity for her to participate in the next session of [facility] ("Finding Your New Normal") support group series designed for patients after they have completed treatment.   Ms. Darius was encouraged to take advantage of our many other support services programs, support groups, and/or counseling in coping with her new life as a cancer survivor after completing anti-cancer treatment.  She was offered support today through active listening and expressive supportive counseling.  She was given information regarding our available services and encouraged to contact me with any questions or for help enrolling in any of our support group/programs.    Dispo:   -Return to cancer center for follow up with Dr. Lindi Adie in August, 2018 -Mammogram due in 10/2017 -Follow up with surgery, Dr. Brantley Stage December, 2018 -She is welcome  to return back to the Survivorship Clinic at any time; no additional follow-up needed at this time.  -Consider referral back to survivorship as a long-term survivor for continued surveillance  A total of (30) minutes of face-to-face time was spent with this patient with greater than 50% of that time in counseling and care-coordination.   Gardenia Phlegm, NP Survivorship Program [facility] [phone]   Note: PRIMARY CARE PROVIDER [name], [place] [phone]

## 2017-06-24 ENCOUNTER — Encounter: Payer: Self-pay | Admitting: Adult Health

## 2017-06-24 ENCOUNTER — Ambulatory Visit (HOSPITAL_BASED_OUTPATIENT_CLINIC_OR_DEPARTMENT_OTHER): Payer: Managed Care, Other (non HMO) | Admitting: Adult Health

## 2017-06-24 VITALS — BP 118/59 | HR 97 | Temp 98.4°F | Resp 17 | Ht 64.0 in | Wt 171.5 lb

## 2017-06-24 DIAGNOSIS — D0512 Intraductal carcinoma in situ of left breast: Secondary | ICD-10-CM

## 2017-06-24 DIAGNOSIS — Z170 Estrogen receptor positive status [ER+]: Secondary | ICD-10-CM

## 2017-06-24 DIAGNOSIS — Z79811 Long term (current) use of aromatase inhibitors: Secondary | ICD-10-CM | POA: Diagnosis not present

## 2017-06-24 DIAGNOSIS — C50212 Malignant neoplasm of upper-inner quadrant of left female breast: Secondary | ICD-10-CM

## 2017-07-10 ENCOUNTER — Ambulatory Visit (HOSPITAL_BASED_OUTPATIENT_CLINIC_OR_DEPARTMENT_OTHER): Payer: Managed Care, Other (non HMO) | Admitting: Hematology and Oncology

## 2017-07-10 ENCOUNTER — Encounter: Payer: Self-pay | Admitting: Hematology and Oncology

## 2017-07-10 DIAGNOSIS — Z923 Personal history of irradiation: Secondary | ICD-10-CM | POA: Diagnosis not present

## 2017-07-10 DIAGNOSIS — D0512 Intraductal carcinoma in situ of left breast: Secondary | ICD-10-CM

## 2017-07-10 DIAGNOSIS — Z79811 Long term (current) use of aromatase inhibitors: Secondary | ICD-10-CM

## 2017-07-10 DIAGNOSIS — C50212 Malignant neoplasm of upper-inner quadrant of left female breast: Secondary | ICD-10-CM

## 2017-07-10 DIAGNOSIS — Z170 Estrogen receptor positive status [ER+]: Secondary | ICD-10-CM | POA: Diagnosis not present

## 2017-07-10 NOTE — Assessment & Plan Note (Signed)
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 Intermittent hot flashes 2. occasional muscle cramps 3. Heavy menstrual bleeding: Patient has fibroids. She is getting evaluated by GYN. She was admitted to the hospital in May with a hemoglobin of 5.4 and was given IV iron infusions as well as blood transfusion. She is currently on oral iron 3 times a day and last hemoglobin apparently was 11.2. She is going to her gynecologist for checkup in September to decide if she needs an ablation or hysterectomy.  Return to clinic in 6 months for follow-up her mammograms have been set up for November 2018.

## 2017-07-10 NOTE — Progress Notes (Signed)
Patient Care Team: [name], PA as PCP - General (Family Medicine) [name], MD as Consulting Physician (General Surgery) Nicholas Lose, MD as Consulting Physician (Hematology and Oncology) Kyung Rudd, MD as Consulting Physician (Radiation Oncology) Delice Bison Charlestine Massed, NP as Nurse Practitioner (Hematology and Oncology)  DIAGNOSIS:  Encounter Diagnosis  Name Primary?  . Malignant neoplasm of upper-inner quadrant of left breast in female, estrogen receptor positive ([place])     SUMMARY OF ONCOLOGIC HISTORY:   Malignant neoplasm of upper-inner quadrant of left female breast ([place])   11/07/2016 Initial Diagnosis    Left breast biopsy UIQ: DCIS with necrosis and calcifications grade 2-3, suspicion for microscopic invasion, ER 95%, PR 95%, Tis N0 stage 0; screening tomo detected left breast calcifications 1.2 cm      12/02/2016 Genetic Testing    Negative genetic testing on the Common HEreditary cancer panel.  The Hereditary Gene Panel offered by [facility] includes sequencing and/or deletion duplication testing of the following 43 genes: APC, ATM, AXIN2, BARD1, BMPR1A, BRCA1, BRCA2, BRIP1, CDH1, CDKN2A (p14ARF), CDKN2A (p16INK4a), CHEK2, DICER1, EPCAM (Deletion/duplication testing only), GREM1 (promoter region deletion/duplication testing only), KIT, MEN1, MLH1, MSH2, MSH6, MUTYH, NBN, NF1, PALB2, PDGFRA, PMS2, POLD1, POLE, PTEN, RAD50, RAD51C, RAD51D, SDHB, SDHC, SDHD, SMAD4, SMARCA4. STK11, TP53, TSC1, TSC2, and VHL.  The following gene was evaluated for sequence changes only: SDHA and HOXB13 c.251G>A.  The report date is December 02, 2016.      12/18/2016 Surgery    Left Lumpectomy: HG DCIS 1.2 cm, Margins Neg, 0/7 LN Neg; TisN0 (Stage 0)      01/21/2017 - 03/06/2017 Radiation Therapy    Adjuvant radiation therapy [facility]): 1) Left Breast: 50.4 Gy in 28 fractions.  2) Left Breast Boost: 10 Gy in 5 fractions.      04/09/2017 -  Anti-estrogen oral therapy    Tamoxifen 20 mg  daily       CHIEF COMPLIANT: Follow-up on tamoxifen therapy  INTERVAL HISTORY: Donna Matthews is a 41 year old with above-mentioned history of high-grade DCIS left breast treated with lumpectomy and radiation is currently on tamoxifen. She is tolerating tamoxifen extremely well. She does have occasional hot flashes and intermittent muscle cramps. Her biggest issue has been heavy menstrual bleeding. She was admitted to the hospital with hemoglobin of 5.4 in June 2018. This is only after taking tamoxifen for about a month. May not be entirely related to tamoxifen but she received blood transfusions and iron infusions and she is currently on oral iron 3 times a day. She has an appointment in September with her gynecologist to evaluate her options whether she needs an ablation or hysterectomy.  REVIEW OF SYSTEMS:   Constitutional: Denies fevers, chills or abnormal weight loss Eyes: Denies blurriness of vision Ears, nose, mouth, throat, and face: Denies mucositis or sore throat Respiratory: Denies cough, dyspnea or wheezes Cardiovascular: Denies palpitation, chest discomfort Gastrointestinal:  Denies nausea, heartburn or change in bowel habits Skin: Denies abnormal skin rashes Lymphatics: Denies new lymphadenopathy or easy bruising Neurological:Denies numbness, tingling or new weaknesses Behavioral/Psych: Mood is stable, no new changes  Extremities: No lower extremity edema Breast: Intermittent breast discomfort All other systems were reviewed with the patient and are negative.  I have reviewed the past medical history, past surgical history, social history and family history with the patient and they are unchanged from previous note.  ALLERGIES:  is allergic to bee venom and penicillins.  MEDICATIONS:  Current Outpatient Prescriptions  Medication Sig Dispense Refill  .  betamethasone valerate ointment (VALISONE) 0.1 % Apply 1 application topically 2 (two) times daily. 30 g 0  . Biotin 1 MG  CAPS Take 1 mg by mouth daily.    Marland Kitchen dicyclomine (BENTYL) 20 MG tablet Take 1 tablet (20 mg total) by mouth 2 (two) times daily. 20 tablet 0  . Ferrous Sulfate (IRON) 325 (65 Fe) MG TABS Take 1 tablet by mouth 3 (three) times daily. 30 each 0  . Multiple Vitamin (MULTIVITAMIN) tablet Take 1 tablet by mouth daily.    . ondansetron (ZOFRAN ODT) 4 MG disintegrating tablet Take 1 tablet (4 mg total) by mouth every 8 (eight) hours as needed for nausea or vomiting. 20 tablet 0  . tamoxifen (NOLVADEX) 20 MG tablet Take 1 tablet (20 mg total) by mouth daily. 90 tablet 3   No current facility-administered medications for this visit.     PHYSICAL EXAMINATION: ECOG PERFORMANCE STATUS: 1 - Symptomatic but completely ambulatory  Vitals:   07/10/17 0839  BP: 122/70  Pulse: 84  Resp: 18  Temp: 98.1 F (36.7 C)   Filed Weights   07/10/17 0839  Weight: 173 lb 12.8 oz (78.8 kg)    GENERAL:alert, no distress and comfortable SKIN: skin color, texture, turgor are normal, no rashes or significant lesions EYES: normal, Conjunctiva are pink and non-injected, sclera clear OROPHARYNX:no exudate, no erythema and lips, buccal mucosa, and tongue normal  NECK: supple, thyroid normal size, non-tender, without nodularity LYMPH:  no palpable lymphadenopathy in the cervical, axillary or inguinal LUNGS: clear to auscultation and percussion with normal breathing effort HEART: regular rate & rhythm and no murmurs and no lower extremity edema ABDOMEN:abdomen soft, non-tender and normal bowel sounds MUSCULOSKELETAL:no cyanosis of digits and no clubbing  NEURO: alert & oriented x 3 with fluent speech, no focal motor/sensory deficits EXTREMITIES: No lower extremity edema  LABORATORY DATA:  I have reviewed the data as listed   Chemistry      Component Value Date/Time   NA 142 05/23/2017 0809   NA 141 11/14/2016 1221   K 3.6 05/23/2017 0809   K 3.9 11/14/2016 1221   CL 110 05/23/2017 0809   CO2 26 05/23/2017  0809   CO2 25 11/14/2016 1221   BUN 7 05/23/2017 0809   BUN 8.3 11/14/2016 1221   CREATININE 0.55 05/23/2017 0809   CREATININE 0.7 11/14/2016 1221      Component Value Date/Time   CALCIUM 8.2 (L) 05/23/2017 0809   CALCIUM 8.9 11/14/2016 1221   ALKPHOS 38 05/22/2017 2139   ALKPHOS 64 11/14/2016 1221   AST 19 05/22/2017 2139   AST 11 11/14/2016 1221   ALT 13 (L) 05/22/2017 2139   ALT 10 11/14/2016 1221   BILITOT <0.1 (L) 05/22/2017 2139   BILITOT 0.42 11/14/2016 1221       Lab Results  Component Value Date   WBC 5.1 05/23/2017   HGB 7.7 (L) 05/23/2017   HCT 23.5 (L) 05/23/2017   MCV 69.9 (L) 05/23/2017   PLT 228 05/23/2017   NEUTROABS 5.7 05/22/2017    ASSESSMENT & PLAN:  Malignant neoplasm of upper-inner quadrant of left female breast ([place]) 12/18/16: Left Lumpectomy: HG DCIS 1.2 cm, Margins Neg, 0/7 LN Neg; TisN0 (Stage 1 A) ER 95%, PR 95% Adjuvant radiation therapy 01/21/2017- 03/06/2017  Current treatment: Antiestrogen therapy with tamoxifen 5 years, started 03/10/2017  Tamoxifen Toxicities: 1. Intermittent hot flashes 2. occasional muscle cramps 3. Heavy menstrual bleeding: Patient has fibroids. She is getting evaluated by GYN. She was  admitted to the hospital in May with a hemoglobin of 5.4 and was given IV iron infusions as well as blood transfusion. She is currently on oral iron 3 times a day and last hemoglobin apparently was 11.2. She is going to her gynecologist for checkup in September to decide if she needs an ablation or hysterectomy.  Return to clinic in 6 months for follow-up her mammograms have been set up for November 2018.   I spent 25 minutes talking to the patient of which more than half was spent in counseling and coordination of care.  No orders of the defined types were placed in this encounter.  The patient has a good understanding of the overall plan. she agrees with it. she will call with any problems that may develop before the next visit  here.   Rulon Eisenmenger, MD 07/10/17

## 2017-07-28 ENCOUNTER — Encounter (HOSPITAL_COMMUNITY): Payer: Self-pay | Admitting: *Deleted

## 2017-07-28 ENCOUNTER — Emergency Department (HOSPITAL_COMMUNITY)
Admission: EM | Admit: 2017-07-28 | Discharge: 2017-07-28 | Disposition: A | Discharge status: Home / Self Care | Payer: 59 | Attending: Emergency Medicine | Admitting: Emergency Medicine

## 2017-07-28 DIAGNOSIS — N939 Abnormal uterine and vaginal bleeding, unspecified: Secondary | ICD-10-CM

## 2017-07-28 DIAGNOSIS — R55 Syncope and collapse: Secondary | ICD-10-CM | POA: Diagnosis present

## 2017-07-28 DIAGNOSIS — Z79899 Other long term (current) drug therapy: Secondary | ICD-10-CM | POA: Insufficient documentation

## 2017-07-28 LAB — CBC WITH DIFFERENTIAL/PLATELET
BASOS ABS: 0 10*3/uL (ref 0.0–0.1)
BASOS PCT: 0 %
Eosinophils Absolute: 0 10*3/uL (ref 0.0–0.7)
Eosinophils Relative: 0 %
HCT: 28.9 % — ABNORMAL LOW (ref 36.0–46.0)
HEMOGLOBIN: 9.9 g/dL — AB (ref 12.0–15.0)
LYMPHS PCT: 12 %
Lymphs Abs: 1 10*3/uL (ref 0.7–4.0)
MCH: 26.8 pg (ref 26.0–34.0)
MCHC: 34.3 g/dL (ref 30.0–36.0)
MCV: 78.3 fL (ref 78.0–100.0)
MONOS PCT: 4 %
Monocytes Absolute: 0.3 10*3/uL (ref 0.1–1.0)
NEUTROS ABS: 6.9 10*3/uL (ref 1.7–7.7)
NEUTROS PCT: 84 %
Platelets: 230 10*3/uL (ref 150–400)
RBC: 3.69 MIL/uL — ABNORMAL LOW (ref 3.87–5.11)
RDW: 16.6 % — ABNORMAL HIGH (ref 11.5–15.5)
WBC: 8.2 10*3/uL (ref 4.0–10.5)

## 2017-07-28 LAB — COMPREHENSIVE METABOLIC PANEL
ALK PHOS: 42 U/L (ref 38–126)
ALT: 15 U/L (ref 14–54)
ANION GAP: 8 (ref 5–15)
AST: 18 U/L (ref 15–41)
Albumin: 3.5 g/dL (ref 3.5–5.0)
BUN: 11 mg/dL (ref 6–20)
CALCIUM: 8.7 mg/dL — AB (ref 8.9–10.3)
CO2: 23 mmol/L (ref 22–32)
Chloride: 108 mmol/L (ref 101–111)
Creatinine, Ser: 0.69 mg/dL (ref 0.44–1.00)
GFR calc non Af Amer: >60 mL/min (ref >60)
Glucose, Bld: 132 mg/dL — ABNORMAL HIGH (ref 65–99)
POTASSIUM: 3.6 mmol/L (ref 3.5–5.1)
SODIUM: 139 mmol/L (ref 135–145)
TOTAL PROTEIN: 6.4 g/dL — AB (ref 6.5–8.1)
Total Bilirubin: 0.3 mg/dL (ref 0.3–1.2)

## 2017-07-28 LAB — I-STAT BETA HCG BLOOD, ED (MC, WL, AP ONLY): I-stat hCG, quantitative: <5 m[IU]/mL (ref <5)

## 2017-07-28 MED ORDER — SODIUM CHLORIDE 0.9 % IV BOLUS (SEPSIS)
1000.0000 mL | Freq: Once | INTRAVENOUS | Status: AC
  Administered 2017-07-28: 1000 mL via INTRAVENOUS

## 2017-07-28 NOTE — ED Provider Notes (Signed)
[facility] DEPT Provider Note   CSN: [id number] Arrival date & time: 07/28/17  1707     History   Chief Complaint Chief Complaint  Patient presents with  . Loss of Consciousness  . Vaginal Bleeding    HPI Donna Matthews is a 41 y.o. female.  Patient is a 41 year old female with history of fibroids and breast cancer presenting with complaints of syncope. She has been on her period since Friday. Today she went into the bathroom and began having more bleeding. She began to feel flushed, hot, and is if she was can pass out. She called for her significant other who attempted to assist her to the bed, however she then lost consciousness for several moments. He then called 911 and the patient was transported here. She is now awake and alert. She does feel fatigued and weak. She denies any chest pain or difficulty breathing.   The history is provided by the patient.  Loss of Consciousness   This is a new problem. The current episode started less than 1 hour ago. The problem occurs constantly. The problem has been resolved. She lost consciousness for a period of less than one minute. Associated with: Vaginal bleeding, going to the bathroom. Pertinent negatives include bladder incontinence, bowel incontinence and chest pain. She has tried nothing for the symptoms. The treatment provided no relief.    Past Medical History:  Diagnosis Date  . Anemia   . Breast cancer ([place]) 10/2016  . Ectopic pregnancy   . Family history of melanoma   . Family history of stomach cancer   . Fibroids   . GERD (gastroesophageal reflux disease)   . STD (sexually transmitted disease)    Chlamydia    Patient Active Problem List   Diagnosis Date Noted  . Hyperglycemia 05/23/2017  . Symptomatic anemia 05/22/2017  . Genetic testing 12/05/2016  . Family history of melanoma   . Family history of stomach cancer   . Malignant neoplasm of upper-inner quadrant of left female breast ([place]) 11/09/2016  . Fibroids,  submucosal 05/01/2013  . Dysmenorrhea 05/01/2013  . Anemia 05/01/2013  . Menorrhagia 04/01/2013  . Obesity (BMI 30.0-34.9) 10/31/2012    Past Surgical History:  Procedure Laterality Date  . BREAST LUMPECTOMY WITH RADIOACTIVE SEED AND SENTINEL LYMPH NODE BIOPSY Left 12/18/2016   Procedure: RADIOACTIVE SEED GUIDED LEFT BREAST LUMPECTOMY WITH LEFT AXILLARY SENTINEL LYMPH NODE BIOPSY;  Surgeon: [name], MD;  Location: [facility];  Service: General;  Laterality: Left;  . DILATION AND CURETTAGE OF UTERUS      OB History    Gravida Para Term Preterm AB Living   2       2 0   SAB TAB Ectopic Multiple Live Births   1   1           Home Medications    Prior to Admission medications   Medication Sig Start Date End Date Taking? Authorizing Provider  betamethasone valerate ointment (VALISONE) 0.1 % Apply 1 application topically 2 (two) times daily. 05/28/17   Huel Cote, NP  Biotin 1 MG CAPS Take 1 mg by mouth daily. 04/09/17   Nicholas Lose, MD  dicyclomine (BENTYL) 20 MG tablet Take 1 tablet (20 mg total) by mouth 2 (two) times daily. 05/21/17   Barnet Glasgow, NP  Ferrous Sulfate (IRON) 325 (65 Fe) MG TABS Take 1 tablet by mouth 3 (three) times daily. 05/23/17   Debbe Odea, MD  Multiple Vitamin (MULTIVITAMIN) tablet Take 1 tablet by  mouth daily.    [provider]  ondansetron (ZOFRAN ODT) 4 MG disintegrating tablet Take 1 tablet (4 mg total) by mouth every 8 (eight) hours as needed for nausea or vomiting. 05/21/17   Barnet Glasgow, NP  tamoxifen (NOLVADEX) 20 MG tablet Take 1 tablet (20 mg total) by mouth daily. 04/09/17   Nicholas Lose, MD    Family History Family History  Problem Relation Age of Onset  . Diabetes Mother   . Hypertension Mother   . Heart disease Father   . Heart attack Father   . Diabetes Sister   . Hypertension Sister   . Lung cancer Maternal Aunt   . Melanoma Maternal Uncle   . Lung cancer Maternal Uncle   . Stomach cancer Other         paternal great aunt  . Stomach cancer Cousin        father's maternal cousin  . Cancer Other        2 maternal great aunts with unknown cancer    Social History Social History  Substance Use Topics  . Smoking status: Never Smoker  . Smokeless tobacco: Never Used  . Alcohol use No     Allergies   Bee venom and Penicillins   Review of Systems Review of Systems  Cardiovascular: Positive for syncope. Negative for chest pain.  Gastrointestinal: Negative for bowel incontinence.  Genitourinary: Negative for bladder incontinence.  All other systems reviewed and are negative.    Physical Exam Updated Vital Signs Pulse (!) 130   LMP 07/28/2017   Physical Exam  Constitutional: She is oriented to person, place, and time. She appears well-developed and well-nourished. No distress.  Patient appears somewhat pale.  HENT:  Head: Normocephalic and atraumatic.  Mouth/Throat: Oropharynx is clear and moist.  Eyes: Pupils are equal, round, and reactive to light. EOM are normal.  Neck: Normal range of motion. Neck supple.  Cardiovascular: Normal rate and regular rhythm.  Exam reveals no gallop and no friction rub.   No murmur heard. Pulmonary/Chest: Effort normal and breath sounds normal. No respiratory distress. She has no wheezes.  Abdominal: Soft. Bowel sounds are normal. She exhibits no distension. There is no tenderness.  Musculoskeletal: Normal range of motion. She exhibits no edema.  Neurological: She is alert and oriented to person, place, and time. No cranial nerve deficit. She exhibits normal muscle tone. Coordination normal.  Skin: Skin is warm and dry. She is not diaphoretic.  Nursing note and vitals reviewed.    ED Treatments / Results  Labs (all labs ordered are listed, but only abnormal results are displayed) Labs Reviewed  COMPREHENSIVE METABOLIC PANEL  CBC WITH DIFFERENTIAL/PLATELET  I-STAT BETA HCG BLOOD, ED ([facility], [facility], [facility] ONLY)    EKG  EKG  Interpretation  Date/Time:  Sunday July 28 2017 17:54:31 EDT Ventricular Rate:  111 PR Interval:    QRS Duration: 79 QT Interval:  329 QTC Calculation: 447 R Axis:   57 Text Interpretation:  Sinus tachycardia Left atrial enlargement Borderline T wave abnormalities Confirmed by Veryl Speak [phone]) on 07/28/2017 7:34:53 PM       Radiology No results found.  Procedures Procedures (including critical care time)  Medications Ordered in ED Medications  sodium chloride 0.9 % bolus 1,000 mL (not administered)     Initial Impression / Assessment and Plan / ED Course  I have reviewed the triage vital signs and the nursing notes.  Pertinent labs & imaging results that were available during my care of  the patient were reviewed by me and considered in my medical decision making (see chart for details).  Patient with history of fibroids presenting with vaginal bleeding and apparent syncope. She had a brief loss of consciousness while sitting on the toilet. She does not appear to be actively bleeding here in the emergency department. Her hemoglobin is 9.9 and her vital signs are not orthostatic. She was hydrated and appears stable. I believe she is appropriate for discharge. She is to follow-up with her GYN and return if symptoms worsen. I suspect the syncope is vasovagal.  Final Clinical Impressions(s) / ED Diagnoses   Final diagnoses:  None    New Prescriptions New Prescriptions   No medications on file     Veryl Speak, MD 07/28/17 [phone]

## 2017-07-28 NOTE — Discharge Instructions (Signed)
Continue medications as before.  Follow-up with your GYN of symptoms persist, and return to the ER if symptoms significantly worsen or change.

## 2017-07-28 NOTE — ED Triage Notes (Signed)
Per EMS, pt passed out while on toilet. Pt noted heavy vaginal bleeding x 3 days, is currently on menstrual period. Pt is saturating 1 pad an hour. Pt has hx of uterine fibroids. Pt last had radiation treatment for breast cancer in March. Pt NSR on monitor.   Hr 110 CBG 137 BP 130/82 RR 16 SpO2 100%

## 2017-07-29 ENCOUNTER — Ambulatory Visit (INDEPENDENT_AMBULATORY_CARE_PROVIDER_SITE_OTHER): Payer: 59 | Admitting: Women's Health

## 2017-07-29 ENCOUNTER — Encounter: Payer: Self-pay | Admitting: Women's Health

## 2017-07-29 ENCOUNTER — Telehealth: Payer: Self-pay | Admitting: *Deleted

## 2017-07-29 VITALS — BP 122/78 | Ht 64.0 in | Wt 173.0 lb

## 2017-07-29 DIAGNOSIS — N920 Excessive and frequent menstruation with regular cycle: Secondary | ICD-10-CM | POA: Diagnosis not present

## 2017-07-29 NOTE — Telephone Encounter (Signed)
TC reviewed common to have some shortness of breath and a feel heartbeat when climbing stairs (3 flights) with anemia and dehydration. Reviewed importance of continuing iron supplement, increasing fluids. Instructed to call if continues or gets worse.

## 2017-07-29 NOTE — Progress Notes (Signed)
Presents for follow-up ER visit yesterday from syncope, LMP 07/27/2017, menorrhagia, H&H 9.9/28 at the ER. States had not felt well all day yesterday, mild nausea with hot flushes throughout the day and after walking up to her third floor apartment symptoms increased and then fainted. Partner called 911. Probable dehydration also. History of menorrhagia, fibroids. 12/2016 left breast cancer ER PR positive, HER-2 negative lumpectomy and radiation currently on tamoxifen per Dr.Gudena. Regular monthly cycles first 3 days heavy changing every hour. 05/22/2017 H&H  5.4/17.8  had a transfusion for menorrhagia currently on iron supplements 3 times a day. Scheduled for sonohysterogram in September for possible endometrial ablation. States is fearful vertigo/syncope will occur again. Worked from home today and plans to work from home tomorrow also.  Exam: Appears well, BP and sitting position 128/70, 142/70 standing. No vertigo with position change.  Follow-up syncope Menorrhagia/fibroids 12/2016 Breast cancer lumpectomy/radiation on tamoxifen  Plan: Motrin 600 every 8 hours with cycle, sonohysterogram with Dr. Phineas Real, reviewed importance of increasing fluids, hydration.

## 2017-07-29 NOTE — Telephone Encounter (Signed)
Pt was seen today and said she forgot to mention when walking up stairs she has increased heart and out of breathe as well.

## 2017-07-29 NOTE — Patient Instructions (Addendum)
Syncope Syncope is when you temporarily lose consciousness. Syncope may also be called fainting or passing out. It is caused by a sudden decrease in blood flow to the brain. Even though most causes of syncope are not dangerous, syncope can be a sign of a serious medical problem. Signs that you may be about to faint include:  Feeling dizzy or light-headed.  Feeling nauseous.  Seeing all white or all black in your field of vision.  Having cold, clammy skin.  If you fainted, get medical help right away.Call your local emergency services (911 in the [place] Do not drive yourself to the hospital. Follow these instructions at home: Pay attention to any changes in your symptoms. Take these actions to help with your condition:  Have someone stay with you until you feel stable.  Do not drive, use machinery, or play sports until your health care provider says it is okay.  Keep all follow-up visits as told by your health care provider. This is important.  If you start to feel like you might faint, lie down right away and raise (elevate) your feet above the level of your heart. Breathe deeply and steadily. Wait until all of the symptoms have passed.  Drink enough fluid to keep your urine clear or pale yellow.  If you are taking blood pressure or heart medicine, get up slowly and take several minutes to sit and then stand. This can reduce dizziness.  Take over-the-counter and prescription medicines only as told by your health care provider.  Get help right away if:  You have a severe headache.  You have unusual pain in your chest, abdomen, or back.  You are bleeding from your mouth or rectum, or you have black or tarry stool.  You have a very fast or irregular heartbeat (palpitations).  You have pain with breathing.  You faint once or repeatedly.  You have a seizure.  You are confused.  You have trouble walking.  You have severe weakness.  You have vision problems. These  symptoms may represent a serious problem that is an emergency. Do not wait to see if your symptoms will go away. Get medical help right away. Call your local emergency services (911 in the [place] Do not drive yourself to the hospital. This information is not intended to replace advice given to you by your health care provider. Make sure you discuss any questions you have with your health care provider. Document Released: 11/26/2005 Document Revised: 05/03/2016 Document Reviewed: 08/10/2015 Elsevier Interactive Patient Education  2017 [employer]  Dehydration, Adult Dehydration is a condition in which there is not enough fluid or water in the body. This happens when you lose more fluids than you take in. Important organs, such as the kidneys, brain, and heart, cannot function without a proper amount of fluids. Any loss of fluids from the body can lead to dehydration. Dehydration can range from mild to severe. This condition should be treated right away to prevent it from becoming severe. What are the causes? This condition may be caused by:  Vomiting.  Diarrhea.  Excessive sweating, such as from heat exposure or exercise.  Not drinking enough fluid, especially: ? When ill. ? While doing activity that requires a lot of energy.  Excessive urination.  Fever.  Infection.  Certain medicines, such as medicines that cause the body to lose excess fluid (diuretics).  Inability to access safe drinking water.  Reduced physical ability to get adequate water and food.  What increases the risk?  This condition is more likely to develop in people:  Who have a poorly controlled long-term (chronic) illness, such as diabetes, heart disease, or kidney disease.  Who are age 2 or older.  Who are disabled.  Who live in a place with high altitude.  Who play endurance sports.  What are the signs or symptoms? Symptoms of mild dehydration may include:  Thirst.  Dry lips.  Slightly dry  mouth.  Dry, warm skin.  Dizziness. Symptoms of moderate dehydration may include:  Very dry mouth.  Muscle cramps.  Dark urine. Urine may be the color of tea.  Decreased urine production.  Decreased tear production.  Heartbeat that is irregular or faster than normal (palpitations).  Headache.  Light-headedness, especially when you stand up from a sitting position.  Fainting (syncope). Symptoms of severe dehydration may include:  Changes in skin, such as: ? Cold and clammy skin. ? Blotchy (mottled) or pale skin. ? Skin that does not quickly return to normal after being lightly pinched and released (poor skin turgor).  Changes in body fluids, such as: ? Extreme thirst. ? No tear production. ? Inability to sweat when body temperature is high, such as in hot weather. ? Very little urine production.  Changes in vital signs, such as: ? Weak pulse. ? Pulse that is more than 100 beats a minute when sitting still. ? Rapid breathing. ? Low blood pressure.  Other changes, such as: ? Sunken eyes. ? Cold hands and feet. ? Confusion. ? Lack of energy (lethargy). ? Difficulty waking up from sleep. ? Short-term weight loss. ? Unconsciousness. How is this diagnosed? This condition is diagnosed based on your symptoms and a physical exam. Blood and urine tests may be done to help confirm the diagnosis. How is this treated? Treatment for this condition depends on the severity. Mild or moderate dehydration can often be treated at home. Treatment should be started right away. Do not wait until dehydration becomes severe. Severe dehydration is an emergency and it needs to be treated in a hospital. Treatment for mild dehydration may include:  Drinking more fluids.  Replacing salts and minerals in your blood (electrolytes) that you may have lost. Treatment for moderate dehydration may include:  Drinking an oral rehydration solution (ORS). This is a drink that helps you replace  fluids and electrolytes (rehydrate). It can be found at pharmacies and retail stores. Treatment for severe dehydration may include:  Receiving fluids through an IV tube.  Receiving an electrolyte solution through a feeding tube that is passed through your nose and into your stomach (nasogastric tube, or NG tube).  Correcting any abnormalities in electrolytes.  Treating the underlying cause of dehydration. Follow these instructions at home:  If directed by your health care provider, drink an ORS: ? Make an ORS by following instructions on the package. ? Start by drinking small amounts, about  cup (120 mL) every 5-10 minutes. ? Slowly increase how much you drink until you have taken the amount recommended by your health care provider.  Drink enough clear fluid to keep your urine clear or pale yellow. If you were told to drink an ORS, finish the ORS first, then start slowly drinking other clear fluids. Drink fluids such as: ? Water. Do not drink only water. Doing that can lead to having too little salt (sodium) in the body (hyponatremia). ? Ice chips. ? Fruit juice that you have added water to (diluted fruit juice). ? Low-calorie sports drinks.  Avoid: ? Alcohol. ? Drinks  that contain a lot of sugar. These include high-calorie sports drinks, fruit juice that is not diluted, and soda. ? Caffeine. ? Foods that are greasy or contain a lot of fat or sugar.  Take over-the-counter and prescription medicines only as told by your health care provider.  Do not take sodium tablets. This can lead to having too much sodium in the body (hypernatremia).  Eat foods that contain a healthy balance of electrolytes, such as bananas, oranges, potatoes, tomatoes, and spinach.  Keep all follow-up visits as told by your health care provider. This is important. Contact a health care provider if:  You have abdominal pain that: ? Gets worse. ? Stays in one area (localizes).  You have a rash.  You  have a stiff neck.  You are more irritable than usual.  You are sleepier or more difficult to wake up than usual.  You feel weak or dizzy.  You feel very thirsty.  You have urinated only a small amount of very dark urine over 6-8 hours. Get help right away if:  You have symptoms of severe dehydration.  You cannot drink fluids without vomiting.  Your symptoms get worse with treatment.  You have a fever.  You have a severe headache.  You have vomiting or diarrhea that: ? Gets worse. ? Does not go away.  You have blood or green matter (bile) in your vomit.  You have blood in your stool. This may cause stool to look black and tarry.  You have not urinated in 6-8 hours.  You faint.  Your heart rate while sitting still is over 100 beats a minute.  You have trouble breathing. This information is not intended to replace advice given to you by your health care provider. Make sure you discuss any questions you have with your health care provider. Document Released: 11/26/2005 Document Revised: 06/22/2016 Document Reviewed: 01/20/2016 Elsevier Interactive Patient Education  [employer].

## 2017-07-31 ENCOUNTER — Other Ambulatory Visit: Payer: Self-pay | Admitting: Gynecology

## 2017-07-31 DIAGNOSIS — N920 Excessive and frequent menstruation with regular cycle: Secondary | ICD-10-CM

## 2017-07-31 DIAGNOSIS — N939 Abnormal uterine and vaginal bleeding, unspecified: Secondary | ICD-10-CM

## 2017-08-07 ENCOUNTER — Ambulatory Visit (INDEPENDENT_AMBULATORY_CARE_PROVIDER_SITE_OTHER): Payer: 59 | Admitting: Gynecology

## 2017-08-07 ENCOUNTER — Other Ambulatory Visit: Payer: 59

## 2017-08-07 ENCOUNTER — Other Ambulatory Visit: Payer: Self-pay | Admitting: Gynecology

## 2017-08-07 ENCOUNTER — Ambulatory Visit (INDEPENDENT_AMBULATORY_CARE_PROVIDER_SITE_OTHER): Payer: 59

## 2017-08-07 ENCOUNTER — Encounter: Payer: Self-pay | Admitting: Gynecology

## 2017-08-07 VITALS — BP 118/74

## 2017-08-07 DIAGNOSIS — N939 Abnormal uterine and vaginal bleeding, unspecified: Secondary | ICD-10-CM | POA: Diagnosis not present

## 2017-08-07 DIAGNOSIS — D251 Intramural leiomyoma of uterus: Secondary | ICD-10-CM

## 2017-08-07 DIAGNOSIS — N920 Excessive and frequent menstruation with regular cycle: Secondary | ICD-10-CM

## 2017-08-07 DIAGNOSIS — N852 Hypertrophy of uterus: Secondary | ICD-10-CM

## 2017-08-07 DIAGNOSIS — D500 Iron deficiency anemia secondary to blood loss (chronic): Secondary | ICD-10-CM

## 2017-08-07 DIAGNOSIS — D259 Leiomyoma of uterus, unspecified: Secondary | ICD-10-CM

## 2017-08-07 DIAGNOSIS — D250 Submucous leiomyoma of uterus: Secondary | ICD-10-CM

## 2017-08-07 DIAGNOSIS — N924 Excessive bleeding in the premenopausal period: Secondary | ICD-10-CM

## 2017-08-07 MED ORDER — TRANEXAMIC ACID 650 MG PO TABS: 1300.0000 mg | ORAL_TABLET | Freq: Three times a day (TID) | ORAL | 2 refills | Status: DC

## 2017-08-07 NOTE — Patient Instructions (Signed)
Recheck your blood count in 2 weeks.  Think of your options as we discussed and follow up with your decision.

## 2017-08-07 NOTE — Progress Notes (Signed)
    Donna Matthews 02-12-1976 [id number]        41 y.o.  G2P0020 presents for sonohysterogram. History of menorrhagia with known leiomyoma. Required transfusion earlier this year with hemoglobin of 5. Most recent hemoglobin 9.  Past medical history,surgical history, problem list, medications, allergies, family history and social history were all reviewed and documented in the EPIC chart.  Directed ROS with pertinent positives and negatives documented in the history of present illness/assessment and plan.  Exam: [place] assistant General appearance:  Normal Pelvic external BUS vagina. Cervix normal. Uterus enlarged approximately 18 week size irregular consistent with leiomyoma  Ultrasound:  Transvaginal and transabdominal shows uterine enlargement with multiple myomas measuring 71 mm, 66 mm, 62 mm, 23 mm. Endometrial echo unable to be visualized. Right and left ovaries normal. Cul-de-sac negative.  Sonohysterogram: The cervix was visualized with a speculum, cleansed with Betadine and the sonohysterogram catheter was placed without difficulty, the cavity subsequently distended with a submucous myoma noted measuring 62 x 58 mm. No evidence of polyps or other pathology. An endometrial sample was taken. The patient tolerated well.  Assessment/Plan:  41 y.o. G2P0020 with multiple large myomas, menorrhagia with significant anemia. Reviewed options with the patient to include hormonal suppression, hysteroscopy was submucous myoma resection, Depo-Lupron, Mirena IUD, uterine artery embolization, multiple myomectomy either open or attempted robotic, hysterectomy. The pros and cons of each choice reviewed. Do not think hysteroscopic resection given large size of myoma and multiple other myomas wise. Issues with uterine artery embolization to include regrowth of myomas in the future an unknown long term pregnancy risks discussed. Temporizing with hormonal/Depo-Lupron and question whether long-term efficacy will  be good. Mirena IUD although with large submucous myoma do not feel wise to place. Hysterectomy with absolute and irreversible sterility but permanent solution to her issues also discussed. Multiple myomectomy if childbearing desired recognizing possibility of regrowth of myomas also discussed. Patient wants to think of her options. I recommended on the short-term to maintain her daily iron supplement and to use Lysteda with her next menses. She has no history of thrombosis or high risk factors. Patient will recheck her hemoglobin in 2 weeks. She will think of her options in follow up with her decisions. She will follow up for her biopsy results in several days from her sonohysterogram.    Anastasio Auerbach MD, 9:45 AM 08/07/2017

## 2017-08-13 ENCOUNTER — Telehealth: Payer: Self-pay | Admitting: Gynecology

## 2017-08-13 NOTE — Telephone Encounter (Signed)
Tell patient the biopsy from the ultrasound showed evidence of a polyp which was benign in appearance. The question is whether more polyp remains. Normally I would recommend hysteroscopy D&C to resect any remaining polyp. She also has what appears to be large myoma within the cavity which could hinder visualization.  If she is planning to proceed with hysterectomy regardless then I don't think anything further needs to be done as far as the polyp is concerned because that will be removed with the uterus. If she is not considering hysterectomy then I would recommend office visit to discuss options.

## 2017-08-14 NOTE — Telephone Encounter (Signed)
Pt informed, will schedule OV to discuss options.

## 2017-08-19 ENCOUNTER — Ambulatory Visit (INDEPENDENT_AMBULATORY_CARE_PROVIDER_SITE_OTHER): Payer: 59 | Admitting: Gynecology

## 2017-08-19 ENCOUNTER — Encounter: Payer: Self-pay | Admitting: Gynecology

## 2017-08-19 VITALS — BP 138/82

## 2017-08-19 DIAGNOSIS — N924 Excessive bleeding in the premenopausal period: Secondary | ICD-10-CM | POA: Diagnosis not present

## 2017-08-19 DIAGNOSIS — D259 Leiomyoma of uterus, unspecified: Secondary | ICD-10-CM | POA: Diagnosis not present

## 2017-08-19 LAB — CBC WITH DIFFERENTIAL/PLATELET
Basophils Absolute: 19 cells/uL (ref 0–200)
Basophils Relative: 0.4 %
EOS PCT: 0.8 %
Eosinophils Absolute: 38 cells/uL (ref 15–500)
HEMATOCRIT: 29.5 % — AB (ref 35.0–45.0)
Hemoglobin: 9.8 g/dL — ABNORMAL LOW (ref 11.7–15.5)
LYMPHS ABS: 797 {cells}/uL — AB (ref 850–3900)
MCH: 26.4 pg — ABNORMAL LOW (ref 27.0–33.0)
MCHC: 33.2 g/dL (ref 32.0–36.0)
MCV: 79.5 fL — ABNORMAL LOW (ref 80.0–100.0)
MPV: 10.7 fL (ref 7.5–12.5)
Monocytes Relative: 11.6 %
NEUTROS ABS: 3389 {cells}/uL (ref 1500–7800)
NEUTROS PCT: 70.6 %
PLATELETS: 253 10*3/uL (ref 140–400)
RBC: 3.71 10*6/uL — ABNORMAL LOW (ref 3.80–5.10)
RDW: 15.8 % — AB (ref 11.0–15.0)
Total Lymphocyte: 16.6 %
WBC mixed population: 557 cells/uL (ref 200–950)
WBC: 4.8 10*3/uL (ref 3.8–10.8)

## 2017-08-19 NOTE — Progress Notes (Signed)
    Donna Matthews June 04, 1976 [id number]        41 y.o.  G2P0020 presents to discuss her situation and treatment options. History of menorrhagia and leiomyoma. Required transfusion earlier this year with hemoglobin of 5. Most recent hemoglobin 9. Sonohysterogram performed 08/07/2017. 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 Endometrial biopsy returned showing endometrial polyp.  Past medical history,surgical history, problem list, medications, allergies, family history and social history were all reviewed and documented in the EPIC chart.  Directed ROS with pertinent positives and negatives documented in the history of present illness/assessment and plan.  Exam: Vitals:   08/19/17 0915  BP: 138/82   General appearance:  Normal   Assessment/Plan:  41 y.o. G2P0020 With menorrhagia, enlarged uterus, endometrial biopsy showing benign endometrial polyp but not clearly visualized on sonohysterogram with distortion from submucous myoma. Reviewed options to include Depo-Lupron, myomectomy, hysterectomy, uterine artery embolization. Do not feel IUD appropriate given distortion of the cavity from her large myoma. The pros and cons of each choice discussed. Issues of future childbearing reviewed. The issue of the benign polyp on biopsy but not visualized on sonohysterogram. At this point patient has prescription for Lysteda and will use this at the earliest onset of her next menses. We'll continue with iron supplementation. Recheck hemoglobin today. She wants to think of all of her options and then she will call me in follow up. Issues of the benign polyp on biopsy but not visualized on sonohysterogram discussed. If ongoing management with leave uterus intact and the question is whether we should relook or rebiopsy endometrium it sometime in the future like at 6 months was reviewed. She wants to think of all these options and then  she will follow up with me with her decision.  Greater than 50% of my time was spent in direct face to face counseling and coordination of care with the patient.     Anastasio Auerbach MD, 9:39 AM 08/19/2017

## 2017-08-19 NOTE — Patient Instructions (Signed)
Call if your next menses to report how you did.

## 2017-08-19 NOTE — Progress Notes (Signed)
d 

## 2017-08-20 ENCOUNTER — Other Ambulatory Visit: Payer: 59

## 2017-09-02 ENCOUNTER — Other Ambulatory Visit: Payer: Managed Care, Other (non HMO)

## 2017-09-02 ENCOUNTER — Ambulatory Visit: Payer: Managed Care, Other (non HMO) | Admitting: Women's Health

## 2017-09-03 ENCOUNTER — Telehealth: Payer: Self-pay | Admitting: *Deleted

## 2017-09-03 NOTE — Telephone Encounter (Signed)
For this cycle I which is complete to 5 days as she has available and then wait and see what happens. For next cycle I would wait and not started on the spotting day but on the day that she starts to have some flow and then take it twice daily for 5 days regardless of how she's bleeding. I would not recommend just taking one dose a day if she is not bleeding heavily as that is not the way that it is recommended to be used.

## 2017-09-03 NOTE — Telephone Encounter (Signed)
Patient was prescribed lysteda 650 mg to help with cycle. Pt said she started spotting on Saturday took 1 dose of pills, bleeding was very light didn't take another dose. On Sunday she took regular dose,same for Monday cycle still wasn't heavy. Today is her heaviest day which is day 4 day took regular dose, tomorrow will be day 5.  Told not to take more than 5 days, pt said her cycle can last up to 7 days. She wants to know what to do if cycle is greater than 5 days heavy? Please advise

## 2017-09-04 NOTE — Telephone Encounter (Signed)
Patient informed with the below note.

## 2017-09-17 ENCOUNTER — Telehealth: Payer: Self-pay | Admitting: *Deleted

## 2017-09-17 DIAGNOSIS — D649 Anemia, unspecified: Secondary | ICD-10-CM

## 2017-09-17 NOTE — Telephone Encounter (Signed)
Pt wanted to update you with recent cycle with lysteda in sept. Pt said the medication did help a little with the heavy flow, but she didn't " get sick and feel like she was going to pass out" had couple days of spotting then a heavy flow, then stopped. Pt is coming to have CBC rechecked tomorrow at 9 am. She called because she wanted to know how long she was going to take lysteda? States she did want to proceed with myomectomy. I told her I would relay the message.

## 2017-09-18 ENCOUNTER — Other Ambulatory Visit: Payer: 59

## 2017-09-18 DIAGNOSIS — D649 Anemia, unspecified: Secondary | ICD-10-CM

## 2017-09-18 LAB — CBC WITH DIFFERENTIAL/PLATELET
BASOS ABS: 9 {cells}/uL (ref 0–200)
Basophils Relative: 0.2 %
EOS PCT: 1.3 %
Eosinophils Absolute: 60 cells/uL (ref 15–500)
HEMATOCRIT: 28.6 % — AB (ref 35.0–45.0)
Hemoglobin: 9.6 g/dL — ABNORMAL LOW (ref 11.7–15.5)
LYMPHS ABS: 943 {cells}/uL (ref 850–3900)
MCH: 26.5 pg — AB (ref 27.0–33.0)
MCHC: 33.6 g/dL (ref 32.0–36.0)
MCV: 79 fL — ABNORMAL LOW (ref 80.0–100.0)
MPV: 10.4 fL (ref 7.5–12.5)
Monocytes Relative: 10.1 %
NEUTROS ABS: 3123 {cells}/uL (ref 1500–7800)
Neutrophils Relative %: 67.9 %
Platelets: 339 10*3/uL (ref 140–400)
RBC: 3.62 10*6/uL — ABNORMAL LOW (ref 3.80–5.10)
RDW: 16.5 % — ABNORMAL HIGH (ref 11.0–15.0)
Total Lymphocyte: 20.5 %
WBC: 4.6 10*3/uL (ref 3.8–10.8)
WBCMIX: 465 {cells}/uL (ref 200–950)

## 2017-09-18 NOTE — Telephone Encounter (Signed)
I would stay on Lysteda for now. Issue talking about a hysteroscopic  myomectomy or an open abdominal multiple myomectomy? Also verify she is okay with transfusion if would be needed during surgery

## 2017-09-23 NOTE — Telephone Encounter (Signed)
Pt aware, regarding lysteda.Pt said she would like the open abdominal multiple myomectomy. And she is okay with a transfusion if needed.

## 2017-09-25 ENCOUNTER — Telehealth: Payer: Self-pay | Admitting: Hematology and Oncology

## 2017-09-25 NOTE — Telephone Encounter (Signed)
Faxed records to [place] [phone]

## 2017-09-30 NOTE — Telephone Encounter (Signed)
Tell patient that her hemoglobin does remain low. If we proceed with myomectomy now than there is a good chance that we will need to transfuse her. Alternative would be to consider Depo-Lupron suppression for 3 months and schedule her surgery at the end of the 3 months to allow for hemoglobin recovery. I think is up to her when she wants to time the surgery.  Regardless I would make sure she stays on extra iron twice daily if possible now. Also continue with Lysteda.  Let me know how she wants to proceed.

## 2017-10-01 NOTE — Telephone Encounter (Signed)
Patient informed, states her PCP told her to take iron supplement 3 times daily, she is doing and will continue lysteda. Pt said she is willing to try the depo-lupron depending on cost per patient. I explained to her I am not aware of the cost. But I would relay to you that she is wiling to try.

## 2017-10-01 NOTE — Telephone Encounter (Signed)
Left message for pt

## 2017-10-01 NOTE — Telephone Encounter (Signed)
Ok to arrange for Depo Lupron 11.25 mg with menses and plan myomectomy 2-3 months after

## 2017-10-07 ENCOUNTER — Telehealth: Payer: Self-pay

## 2017-10-07 NOTE — Telephone Encounter (Addendum)
Pt calling for advise on wether or not she needed to be seen for a breast exam prior to her mm in 2 weeks. Pt had been experiencing intermittent breast pain that last for a second, for about a month now. Pt explains the breast pain in the areola area. No noted swelling, fever, redness, texture or color changes around her breast. Pt states that she was a little nervous and didn't kow what to do. Her mammogram is in 2 weeks. Told pt to wait until her mammogram to see if there is anything wrong. Also advised her to call and see if they can reschedule her for an earlier date to have her mammogram done. Pt verbalized understanding. Denies any nodules/lumps around the breast area at this time. Pt said that Dr.Gudena told her that she could expect breast pain after radiation and it is normal and doesn't necessarily means that her cancer is back. Reassured pt that there is nothing to be worried about right now. She will get her mammograms done and will go from there. Pt thankful for the call. No other needs at this time.

## 2017-10-14 ENCOUNTER — Encounter: Payer: Managed Care, Other (non HMO) | Admitting: Women's Health

## 2017-10-22 ENCOUNTER — Telehealth: Payer: Self-pay

## 2017-10-22 NOTE — Telephone Encounter (Signed)
I called [place] who is handling patient's Lupron order to check status.  I arranged shipment to [facility] for 10/24/17.

## 2017-10-23 ENCOUNTER — Encounter: Payer: Self-pay | Admitting: Women's Health

## 2017-10-23 ENCOUNTER — Ambulatory Visit (INDEPENDENT_AMBULATORY_CARE_PROVIDER_SITE_OTHER): Payer: 59 | Admitting: Women's Health

## 2017-10-23 VITALS — BP 120/78

## 2017-10-23 DIAGNOSIS — R350 Frequency of micturition: Secondary | ICD-10-CM

## 2017-10-23 DIAGNOSIS — N898 Other specified noninflammatory disorders of vagina: Secondary | ICD-10-CM | POA: Diagnosis not present

## 2017-10-23 LAB — WET PREP FOR TRICH, YEAST, CLUE

## 2017-10-23 MED ORDER — FLUCONAZOLE 150 MG PO TABS: 150.0000 mg | ORAL_TABLET | Freq: Once | ORAL | 1 refills | Status: AC

## 2017-10-23 NOTE — Progress Notes (Signed)
41 yo female presents with vaginal pruritus and increased urinary frequency for 2 days. Spotting for the past 2 weeks, unable to discern if vaginal discharge. Has been prescribed Lysteda with no relief, Lupron scheduled to be delivered tomorrow all of planning to return to office to receive. Denies frank hematuria, dysuria, and fever. Same partner. 12/2016 right breast cancer, lumpectomy and radiation. History of menorrhagia, multiple uterine fibroids, and severe anemia requiring blood and iron transfusions.   Exam: Appears well. No CVAT. Abdomen soft and nontender, uterus extending just above umbilicus. External genitalia and cervix within normal limits. Bimanual exam no adnexal tenderness, palpable uterus. Wet prep: negative  UA: 1+ blood, negative nitrites, negative leukocyte esterase, 0-5 WBC, 3-10 RBC, 0-5 squamous epithelial cells, moderate bacteria  Vaginal pruritus Menorrhagia/fibroid uterus  Plan: Diflucan 150 mg by mouth x 1 dose . Urine culture pending. Will return to office for Lupron, reviewed a temporary fix, planning on myomectomy after hemoglobin and hematocrit stabilized. Keep scheduled iron infusion appointment for tomorrow, hemoglobin now 7 down from 9.61 month earlier.Marland Kitchen

## 2017-10-24 ENCOUNTER — Ambulatory Visit (HOSPITAL_COMMUNITY)
Admission: RE | Admit: 2017-10-24 | Discharge: 2017-10-24 | Disposition: A | Discharge status: Home / Self Care | Payer: 59 | Source: Ambulatory Visit | Attending: Family Medicine | Admitting: Family Medicine

## 2017-10-24 DIAGNOSIS — D509 Iron deficiency anemia, unspecified: Secondary | ICD-10-CM | POA: Diagnosis not present

## 2017-10-24 LAB — URINALYSIS W MICROSCOPIC + REFLEX CULTURE
Bilirubin Urine: NEGATIVE
Glucose, UA: NEGATIVE
HYALINE CAST: NONE SEEN /LPF
KETONES UR: NEGATIVE
Leukocyte Esterase: NEGATIVE
NITRITES URINE, INITIAL: NEGATIVE
PH: 5.5 (ref 5.0–8.0)
Protein, ur: NEGATIVE
SPECIFIC GRAVITY, URINE: 1.025 (ref 1.001–1.03)

## 2017-10-24 LAB — NO CULTURE INDICATED

## 2017-10-24 MED ORDER — SODIUM CHLORIDE 0.9 % IV SOLN
510.0000 mg | Freq: Once | INTRAVENOUS | Status: AC
  Administered 2017-10-24: 510 mg via INTRAVENOUS
  Filled 2017-10-24: qty 17

## 2017-10-24 NOTE — Progress Notes (Signed)
Provider: Marilynne Drivers, MD   Procedure: IV Feraheme Infusion   Diagnosis Association: Iron Deficiency Anemia (D50.9)   Treatment: Patient received IV Feraheme Infusion. Patient tolerated procedure well with no transfusion reaction. Discharge instructions given to patient and patient states an understanding. Patient alert, oriented, and ambulatory at time of discharge.

## 2017-10-24 NOTE — Discharge Instructions (Signed)

## 2017-10-28 ENCOUNTER — Ambulatory Visit
Admission: RE | Admit: 2017-10-28 | Discharge: 2017-10-28 | Disposition: A | Discharge status: Home / Self Care | Payer: Managed Care, Other (non HMO) | Source: Ambulatory Visit | Attending: Adult Health | Admitting: Adult Health

## 2017-10-28 DIAGNOSIS — C50212 Malignant neoplasm of upper-inner quadrant of left female breast: Secondary | ICD-10-CM

## 2017-10-28 DIAGNOSIS — Z170 Estrogen receptor positive status [ER+]: Principal | ICD-10-CM

## 2017-10-28 HISTORY — DX: Personal history of irradiation: Z92.3

## 2017-10-29 ENCOUNTER — Telehealth: Payer: Self-pay

## 2017-10-29 NOTE — Telephone Encounter (Signed)
I called [facility] this morning to follow up on Donna Matthews shipment because when I spoke with rep last week I was told med would arrive her on 10/24/17.  It has not yet arrived.  I was told that they have been trying to reach the patient but she has not returned their calls.  I called the patient and she said she has not received any calls from them. However, she was provided the pharmacy number and she will give them a call to confirm they can ship the medication out to Donna Matthews.

## 2017-10-30 ENCOUNTER — Other Ambulatory Visit: Payer: Self-pay | Admitting: Gynecology

## 2017-10-30 ENCOUNTER — Telehealth: Payer: Self-pay

## 2017-10-30 NOTE — Telephone Encounter (Signed)
Called patient and left message that Lupron has arrive. Just needs to call me back to schedule nurse appt. I told her I will call her next week about scheduling Myomectomy 2-3 mos after Lupron injection.

## 2017-11-04 ENCOUNTER — Ambulatory Visit: Payer: 59 | Admitting: *Deleted

## 2017-11-04 ENCOUNTER — Ambulatory Visit (HOSPITAL_COMMUNITY)
Admission: RE | Admit: 2017-11-04 | Discharge: 2017-11-04 | Disposition: A | Discharge status: Home / Self Care | Payer: 59 | Source: Ambulatory Visit | Attending: Family Medicine | Admitting: Family Medicine

## 2017-11-04 DIAGNOSIS — D509 Iron deficiency anemia, unspecified: Secondary | ICD-10-CM | POA: Diagnosis not present

## 2017-11-04 DIAGNOSIS — D250 Submucous leiomyoma of uterus: Secondary | ICD-10-CM

## 2017-11-04 MED ORDER — LEUPROLIDE ACETATE (3 MONTH) 11.25 MG IM KIT
11.2500 mg | PACK | Freq: Once | INTRAMUSCULAR | Status: AC
  Administered 2017-11-04: 11.25 mg via INTRAMUSCULAR

## 2017-11-04 MED ORDER — SODIUM CHLORIDE 0.9 % IV SOLN
510.0000 mg | Freq: Once | INTRAVENOUS | Status: AC
  Administered 2017-11-04: 510 mg via INTRAVENOUS
  Filled 2017-11-04: qty 17

## 2017-11-04 NOTE — Progress Notes (Signed)
Pt arrived for infusion of IV fereheme; pt infusion completed and pt was observed for 30 minutes post infusion with no complications noted:  Associated Diagnosis: Iron deficiency anemia Ordering Provider: Marilynne Drivers, PA

## 2017-11-04 NOTE — Discharge Instructions (Signed)

## 2017-11-19 ENCOUNTER — Encounter: Payer: 59 | Admitting: Women's Health

## 2017-11-21 ENCOUNTER — Observation Stay (HOSPITAL_COMMUNITY)
Admission: EM | Admit: 2017-11-21 | Discharge: 2017-11-22 | Disposition: A | Discharge status: Home / Self Care | Payer: 59 | Attending: Internal Medicine | Admitting: Internal Medicine

## 2017-11-21 ENCOUNTER — Other Ambulatory Visit: Payer: Self-pay

## 2017-11-21 ENCOUNTER — Encounter (HOSPITAL_COMMUNITY): Payer: Self-pay | Admitting: Emergency Medicine

## 2017-11-21 DIAGNOSIS — Z853 Personal history of malignant neoplasm of breast: Secondary | ICD-10-CM | POA: Diagnosis not present

## 2017-11-21 DIAGNOSIS — I951 Orthostatic hypotension: Secondary | ICD-10-CM | POA: Diagnosis present

## 2017-11-21 DIAGNOSIS — Z809 Family history of malignant neoplasm, unspecified: Secondary | ICD-10-CM | POA: Insufficient documentation

## 2017-11-21 DIAGNOSIS — D649 Anemia, unspecified: Secondary | ICD-10-CM | POA: Diagnosis not present

## 2017-11-21 DIAGNOSIS — N946 Dysmenorrhea, unspecified: Secondary | ICD-10-CM | POA: Diagnosis not present

## 2017-11-21 DIAGNOSIS — C50212 Malignant neoplasm of upper-inner quadrant of left female breast: Secondary | ICD-10-CM | POA: Diagnosis present

## 2017-11-21 DIAGNOSIS — D250 Submucous leiomyoma of uterus: Secondary | ICD-10-CM | POA: Diagnosis not present

## 2017-11-21 DIAGNOSIS — Z833 Family history of diabetes mellitus: Secondary | ICD-10-CM | POA: Diagnosis not present

## 2017-11-21 DIAGNOSIS — E66811 Obesity, class 1: Secondary | ICD-10-CM | POA: Diagnosis present

## 2017-11-21 DIAGNOSIS — Z8249 Family history of ischemic heart disease and other diseases of the circulatory system: Secondary | ICD-10-CM | POA: Diagnosis not present

## 2017-11-21 DIAGNOSIS — E669 Obesity, unspecified: Secondary | ICD-10-CM | POA: Diagnosis not present

## 2017-11-21 DIAGNOSIS — Z79899 Other long term (current) drug therapy: Secondary | ICD-10-CM | POA: Insufficient documentation

## 2017-11-21 DIAGNOSIS — Z808 Family history of malignant neoplasm of other organs or systems: Secondary | ICD-10-CM | POA: Insufficient documentation

## 2017-11-21 DIAGNOSIS — D509 Iron deficiency anemia, unspecified: Secondary | ICD-10-CM | POA: Diagnosis not present

## 2017-11-21 DIAGNOSIS — Z801 Family history of malignant neoplasm of trachea, bronchus and lung: Secondary | ICD-10-CM | POA: Insufficient documentation

## 2017-11-21 DIAGNOSIS — Z800 Family history of malignant neoplasm of digestive organs: Secondary | ICD-10-CM | POA: Insufficient documentation

## 2017-11-21 DIAGNOSIS — Z880 Allergy status to penicillin: Secondary | ICD-10-CM | POA: Diagnosis not present

## 2017-11-21 DIAGNOSIS — D219 Benign neoplasm of connective and other soft tissue, unspecified: Secondary | ICD-10-CM | POA: Diagnosis present

## 2017-11-21 DIAGNOSIS — D62 Acute posthemorrhagic anemia: Principal | ICD-10-CM | POA: Insufficient documentation

## 2017-11-21 DIAGNOSIS — N920 Excessive and frequent menstruation with regular cycle: Secondary | ICD-10-CM | POA: Diagnosis not present

## 2017-11-21 DIAGNOSIS — E876 Hypokalemia: Secondary | ICD-10-CM | POA: Diagnosis not present

## 2017-11-21 DIAGNOSIS — Z91030 Bee allergy status: Secondary | ICD-10-CM | POA: Diagnosis not present

## 2017-11-21 DIAGNOSIS — K219 Gastro-esophageal reflux disease without esophagitis: Secondary | ICD-10-CM | POA: Diagnosis not present

## 2017-11-21 DIAGNOSIS — Z6829 Body mass index (BMI) 29.0-29.9, adult: Secondary | ICD-10-CM | POA: Insufficient documentation

## 2017-11-21 DIAGNOSIS — Z923 Personal history of irradiation: Secondary | ICD-10-CM | POA: Insufficient documentation

## 2017-11-21 LAB — COMPREHENSIVE METABOLIC PANEL
ALK PHOS: 37 U/L — AB (ref 38–126)
ALT: 14 U/L (ref 14–54)
AST: 17 U/L (ref 15–41)
Albumin: 3.4 g/dL — ABNORMAL LOW (ref 3.5–5.0)
Anion gap: 3 — ABNORMAL LOW (ref 5–15)
BUN: 13 mg/dL (ref 6–20)
CALCIUM: 8.3 mg/dL — AB (ref 8.9–10.3)
CO2: 26 mmol/L (ref 22–32)
CREATININE: 0.49 mg/dL (ref 0.44–1.00)
Chloride: 112 mmol/L — ABNORMAL HIGH (ref 101–111)
GFR calc non Af Amer: >60 mL/min (ref >60)
Glucose, Bld: 117 mg/dL — ABNORMAL HIGH (ref 65–99)
Potassium: 3.9 mmol/L (ref 3.5–5.1)
SODIUM: 141 mmol/L (ref 135–145)
Total Bilirubin: 0.6 mg/dL (ref 0.3–1.2)
Total Protein: 6 g/dL — ABNORMAL LOW (ref 6.5–8.1)

## 2017-11-21 LAB — APTT: APTT: 24 s (ref 24–36)

## 2017-11-21 LAB — CBC
HCT: 22.2 % — ABNORMAL LOW (ref 36.0–46.0)
Hemoglobin: 7.7 g/dL — ABNORMAL LOW (ref 12.0–15.0)
MCH: 27.8 pg (ref 26.0–34.0)
MCHC: 34.7 g/dL (ref 30.0–36.0)
MCV: 80.1 fL (ref 78.0–100.0)
PLATELETS: 197 10*3/uL (ref 150–400)
RBC: 2.77 MIL/uL — AB (ref 3.87–5.11)
RDW: 16.3 % — AB (ref 11.5–15.5)
WBC: 8.6 10*3/uL (ref 4.0–10.5)

## 2017-11-21 LAB — PROTIME-INR
INR: 1.09
PROTHROMBIN TIME: 14 s (ref 11.4–15.2)

## 2017-11-21 LAB — TSH: TSH: 3.737 u[IU]/mL (ref 0.350–4.500)

## 2017-11-21 LAB — I-STAT BETA HCG BLOOD, ED (MC, WL, AP ONLY): I-stat hCG, quantitative: <5 m[IU]/mL (ref <5)

## 2017-11-21 LAB — PREPARE RBC (CROSSMATCH)

## 2017-11-21 MED ORDER — VITAMIN D3 25 MCG (1000 UNIT) PO TABS
5000.0000 [IU] | ORAL_TABLET | Freq: Every day | ORAL | Status: DC
  Administered 2017-11-22: 5000 [IU] via ORAL
  Filled 2017-11-21: qty 5

## 2017-11-21 MED ORDER — FERROUS SULFATE 325 (65 FE) MG PO TABS
325.0000 mg | ORAL_TABLET | Freq: Three times a day (TID) | ORAL | Status: DC
  Administered 2017-11-22: 325 mg via ORAL
  Filled 2017-11-21: qty 1

## 2017-11-21 MED ORDER — ONE-DAILY MULTI VITAMINS PO TABS: 1.0000 | ORAL_TABLET | Freq: Every day | ORAL | Status: DC

## 2017-11-21 MED ORDER — IRON 325 (65 FE) MG PO TABS: 1.0000 | ORAL_TABLET | Freq: Three times a day (TID) | ORAL | Status: DC

## 2017-11-21 MED ORDER — MOMETASONE FUROATE 0.1 % EX CREA: 1.0000 "application " | TOPICAL_CREAM | Freq: Every day | CUTANEOUS | Status: DC

## 2017-11-21 MED ORDER — ONDANSETRON HCL 4 MG PO TABS: 4.0000 mg | ORAL_TABLET | Freq: Four times a day (QID) | ORAL | Status: DC | PRN

## 2017-11-21 MED ORDER — ADULT MULTIVITAMIN W/MINERALS CH
1.0000 | ORAL_TABLET | Freq: Every day | ORAL | Status: DC
  Administered 2017-11-22: 1 via ORAL
  Filled 2017-11-21: qty 1

## 2017-11-21 MED ORDER — BIOTIN 1 MG PO CAPS: 1.0000 mg | ORAL_CAPSULE | Freq: Every day | ORAL | Status: DC

## 2017-11-21 MED ORDER — NYSTATIN 100000 UNIT/GM EX CREA
1.0000 "application " | TOPICAL_CREAM | Freq: Two times a day (BID) | CUTANEOUS | Status: DC
  Administered 2017-11-21 – 2017-11-22 (×2): 1 via TOPICAL
  Filled 2017-11-21: qty 15

## 2017-11-21 MED ORDER — VITAMINS A & D EX OINT
TOPICAL_OINTMENT | CUTANEOUS | Status: AC
  Administered 2017-11-21: 5
  Filled 2017-11-21: qty 5

## 2017-11-21 MED ORDER — TAMOXIFEN CITRATE 10 MG PO TABS
20.0000 mg | ORAL_TABLET | Freq: Every day | ORAL | Status: DC
  Administered 2017-11-22: 20 mg via ORAL
  Filled 2017-11-21: qty 2

## 2017-11-21 MED ORDER — ACETAMINOPHEN 325 MG PO TABS: 650.0000 mg | ORAL_TABLET | Freq: Four times a day (QID) | ORAL | Status: DC | PRN

## 2017-11-21 MED ORDER — SODIUM CHLORIDE 0.9 % IV SOLN
Freq: Once | INTRAVENOUS | Status: AC
  Administered 2017-11-21: via INTRAVENOUS

## 2017-11-21 MED ORDER — SODIUM CHLORIDE 0.9 % IV BOLUS (SEPSIS): 2000.0000 mL | Freq: Once | INTRAVENOUS | Status: DC

## 2017-11-21 MED ORDER — VITAMIN D3 125 MCG (5000 UT) PO CAPS: 5000.0000 [IU] | ORAL_CAPSULE | Freq: Every day | ORAL | Status: DC

## 2017-11-21 MED ORDER — SODIUM CHLORIDE 0.9 % IV SOLN
INTRAVENOUS | Status: DC
  Administered 2017-11-22: 05:00:00 via INTRAVENOUS

## 2017-11-21 MED ORDER — ONDANSETRON HCL 4 MG/2ML IJ SOLN: 4.0000 mg | Freq: Four times a day (QID) | INTRAMUSCULAR | Status: DC | PRN

## 2017-11-21 MED ORDER — ZOLPIDEM TARTRATE 5 MG PO TABS: 5.0000 mg | ORAL_TABLET | Freq: Every evening | ORAL | Status: DC | PRN

## 2017-11-21 MED ORDER — ACETAMINOPHEN 650 MG RE SUPP: 650.0000 mg | Freq: Four times a day (QID) | RECTAL | Status: DC | PRN

## 2017-11-21 MED ORDER — TRANEXAMIC ACID 650 MG PO TABS
1300.0000 mg | ORAL_TABLET | Freq: Three times a day (TID) | ORAL | Status: DC
  Administered 2017-11-21 – 2017-11-22 (×2): 1300 mg via ORAL
  Filled 2017-11-21 (×3): qty 2

## 2017-11-21 NOTE — Progress Notes (Signed)
PHARMACIST - PHYSICIAN ORDER COMMUNICATION  CONCERNING: P&T Medication Policy on Herbal Medications  DESCRIPTION:  This patient's order for:  Biotin  has been noted.  This product(s) is classified as an "herbal" or natural product. Due to a lack of definitive safety studies or FDA approval, nonstandard manufacturing practices, plus the potential risk of unknown drug-drug interactions while on inpatient medications, the Pharmacy and Therapeutics Committee does not permit the use of "herbal" or natural products of this type within [facility].   ACTION TAKEN: The pharmacy department is unable to verify this order at this time and your patient has been informed of this safety policy. Please reevaluate patient's clinical condition at discharge and address if the herbal or natural product(s) should be resumed at that time.  Peggyann Juba, PharmD, BCPS Pharmacy: [phone] 11/21/2017 9:13 PM

## 2017-11-21 NOTE — ED Notes (Signed)
Bed assigned @ 1934 rm 1403

## 2017-11-21 NOTE — ED Triage Notes (Signed)
Patient reports that she was told to come to ED by her doctor due to when her menstrual cycles come on she bleeds very heavy and counts will drop. Patient reports starting her cycle on Tuesday. Patient family reports that she gets to where she feels like she will about pass out, esp when she gets hot.

## 2017-11-21 NOTE — ED Provider Notes (Signed)
[facility] Provider Note   CSN: [id number] Arrival date & time: 11/21/17  1610     History   Chief Complaint Chief Complaint  Patient presents with  . sent by doctor to check CBC  . Nausea    HPI Donna Matthews is a 41 y.o. female. Chief complaint is vaginal bleeding, anemia.  HPI 41 year old female. History of breast cancer treated last year. History of known fibroids and heavy vaginal bleeding with recurrent anemia. Underwent transfusion in July of this or for hemoglobin of 5. Baseline for the last several months has been at 9. Follows closely with her OB/GYN. Has been given IV Lupron. Has not been given stroke treatment because her history of receptor-positive breast cancer. Plan for myomectomy in February. He has been given Feraheme infusions 2 over the last month. Vaginal bleeding started as her typical menstrual cycle on Tuesday. Has used 1" entire pack" of pads. Lightheaded weak and dizzy with exertion. Resting heart rate of 130. Seen her by her physician.  Past Medical History:  Diagnosis Date  . Anemia   . Breast cancer ([place]) 10/2016  . Ectopic pregnancy   . Family history of melanoma   . Family history of stomach cancer   . Fibroids   . GERD (gastroesophageal reflux disease)   . Personal history of radiation therapy   . STD (sexually transmitted disease)    Chlamydia    Patient Active Problem List   Diagnosis Date Noted  . Fibroid 11/21/2017  . Hyperglycemia 05/23/2017  . Symptomatic anemia 05/22/2017  . Genetic testing 12/05/2016  . Family history of melanoma   . Family history of stomach cancer   . Malignant neoplasm of upper-inner quadrant of left female breast ([place]) 11/09/2016  . Fibroids, submucosal 05/01/2013  . Dysmenorrhea 05/01/2013  . Anemia 05/01/2013  . Menorrhagia 04/01/2013  . Obesity (BMI 30.0-34.9) 10/31/2012    Past Surgical History:  Procedure Laterality Date  . BREAST LUMPECTOMY    . BREAST  LUMPECTOMY WITH RADIOACTIVE SEED AND SENTINEL LYMPH NODE BIOPSY Left 12/18/2016   Procedure: RADIOACTIVE SEED GUIDED LEFT BREAST LUMPECTOMY WITH LEFT AXILLARY SENTINEL LYMPH NODE BIOPSY;  Surgeon: [name], MD;  Location: [facility];  Service: General;  Laterality: Left;  . DILATION AND CURETTAGE OF UTERUS      OB History    Gravida Para Term Preterm AB Living   2       2 0   SAB TAB Ectopic Multiple Live Births   1   1           Home Medications    Prior to Admission medications   Medication Sig Start Date End Date Taking? Authorizing Provider  Biotin 1 MG CAPS Take 1 mg by mouth daily. 04/09/17  Yes Nicholas Lose, MD  Cholecalciferol (VITAMIN D3) 5000 units CAPS Take 5,000 Units by mouth daily.   Yes [provider]  Ferrous Sulfate (IRON) 325 (65 Fe) MG TABS Take 1 tablet by mouth 3 (three) times daily. 05/23/17  Yes Debbe Odea, MD  Multiple Vitamin (MULTIVITAMIN) tablet Take 1 tablet by mouth daily.   Yes [provider]  nystatin cream (MYCOSTATIN) Apply 1 application topically 2 (two) times daily.   Yes [provider]  tranexamic acid (LYSTEDA) 650 MG TABS tablet Take 1,300 mg by mouth 3 (three) times daily. 1300 mg by mouth three times a day during the week of her cycle 09/29/17  Yes [provider]  tamoxifen (NOLVADEX)  20 MG tablet Take 20 mg by mouth daily. 09/29/17   [provider]    Family History Family History  Problem Relation Age of Onset  . Diabetes Mother   . Hypertension Mother   . Heart disease Father   . Heart attack Father   . Diabetes Sister   . Hypertension Sister   . Lung cancer Maternal Aunt   . Melanoma Maternal Uncle   . Lung cancer Maternal Uncle   . Stomach cancer Other        paternal great aunt  . Stomach cancer Cousin        father's maternal cousin  . Cancer Other        2 maternal great aunts with unknown cancer    Social History Social History   Tobacco Use  . Smoking status: Never  Smoker  . Smokeless tobacco: Never Used  Substance Use Topics  . Alcohol use: No    Alcohol/week: 0.0 oz  . Drug use: No     Allergies   Bee venom and Penicillins   Review of Systems Review of Systems  Constitutional: Negative for appetite change, chills, diaphoresis, fatigue and fever.  HENT: Negative for mouth sores, sore throat and trouble swallowing.   Eyes: Negative for visual disturbance.  Respiratory: Negative for cough, chest tightness, shortness of breath and wheezing.   Cardiovascular: Negative for chest pain.  Gastrointestinal: Negative for abdominal distention, abdominal pain, diarrhea, nausea and vomiting.  Endocrine: Negative for polydipsia, polyphagia and polyuria.  Genitourinary: Positive for vaginal bleeding. Negative for dysuria, frequency and hematuria.  Musculoskeletal: Negative for gait problem.  Skin: Negative for color change, pallor and rash.  Neurological: Negative for dizziness, syncope, light-headedness and headaches.  Hematological: Does not bruise/bleed easily.  Psychiatric/Behavioral: Negative for behavioral problems and confusion.     Physical Exam Updated Vital Signs BP 116/69 (BP Location: Right Arm)   Pulse (!) 120   Temp 98.3 F (36.8 C) (Oral)   Resp 17   Ht 5\' 4"  (1.626 m)   Wt 78.7 kg (173 lb 8 oz)   LMP 11/19/2017   SpO2 100%   BMI 29.78 kg/m   Physical Exam  Constitutional: She is oriented to person, place, and time. She appears well-developed and well-nourished. No distress.  HENT:  Head: Normocephalic.  Eyes: Conjunctivae are normal. Pupils are equal, round, and reactive to light. No scleral icterus.  Conjunctiva pale.  Neck: Normal range of motion. Neck supple. No thyromegaly present.  Cardiovascular: Normal rate and regular rhythm. Exam reveals no gallop and no friction rub.  No murmur heard. Resting heart rate sinus tach at 134.  Pulmonary/Chest: Effort normal and breath sounds normal. No respiratory distress. She has  no wheezes. She has no rales.  Abdominal: Soft. Bowel sounds are normal. She exhibits no distension. There is no tenderness. There is no rebound.  Musculoskeletal: Normal range of motion.  Neurological: She is alert and oriented to person, place, and time.  Skin: Skin is warm and dry. No rash noted.  Psychiatric: She has a normal mood and affect. Her behavior is normal.     ED Treatments / Results  Labs (all labs ordered are listed, but only abnormal results are displayed) Labs Reviewed  COMPREHENSIVE METABOLIC PANEL - Abnormal; Notable for the following components:      Result Value   Chloride 112 (*)    Glucose, Bld 117 (*)    Calcium 8.3 (*)    Total Protein 6.0 (*)  Albumin 3.4 (*)    Alkaline Phosphatase 37 (*)    Anion gap 3 (*)    All other components within normal limits  CBC - Abnormal; Notable for the following components:   RBC 2.77 (*)    Hemoglobin 7.7 (*)    HCT 22.2 (*)    RDW 16.3 (*)    All other components within normal limits  PROTIME-INR  APTT  TSH  HIV ANTIBODY (ROUTINE TESTING)  BASIC METABOLIC PANEL  CBC  IRON AND TIBC  I-STAT BETA HCG BLOOD, ED ([facility], [facility], [facility] ONLY)  TYPE AND SCREEN  PREPARE RBC (CROSSMATCH)    EKG  EKG Interpretation None       Radiology No results found.  Procedures Procedures (including critical care time)  Medications Ordered in ED Medications  0.9 %  sodium chloride infusion (not administered)  nystatin cream (MYCOSTATIN) 1 application (not administered)  tamoxifen (NOLVADEX) tablet 20 mg (not administered)  sodium chloride 0.9 % bolus 2,000 mL (not administered)  0.9 %  sodium chloride infusion (not administered)  acetaminophen (TYLENOL) tablet 650 mg (not administered)    Or  acetaminophen (TYLENOL) suppository 650 mg (not administered)  ondansetron (ZOFRAN) tablet 4 mg (not administered)    Or  ondansetron (ZOFRAN) injection 4 mg (not administered)  zolpidem (AMBIEN) tablet 5 mg (not administered)    tranexamic acid (LYSTEDA) tablet 1,300 mg (not administered)  cholecalciferol (VITAMIN D) tablet 5,000 Units (not administered)  ferrous sulfate tablet 325 mg (not administered)  multivitamin with minerals tablet 1 tablet (not administered)     Initial Impression / Assessment and Plan / ED Course  I have reviewed the triage vital signs and the nursing notes.  Pertinent labs & imaging results that were available during my care of the patient were reviewed by me and considered in my medical decision making (see chart for details).    Hemoglobin 7.7. With her described rate of bleeding suspicion is that with her pale conjunctiva resting heart rate she is likely on the way down with her hemoglobin. I written for type and crossmatch and transfusion of 2 units of packed red blood cells. Care discussed with the hospitalist, Dr.Niu. He is planning admission.  Final Clinical Impressions(s) / ED Diagnoses   Final diagnoses:  Anemia, unspecified type    ED Discharge Orders    None       Tanna Furry, MD 11/21/17 2200

## 2017-11-21 NOTE — H&P (Addendum)
History and Physical    Donna Matthews GHW:[id number] DOB: 08/28/1976 DOA: 11/21/2017  Referring MD/NP/PA:   PCP: [name], PA   Patient coming from:  The patient is coming from home.  At baseline, pt is independent for most of ADL.   Chief Complaint: Heavy menstrual period, lightheadedness and dizziness  HPI: Donna Matthews is a 41 y.o. female with medical history significant of Fibroids, breast cancer, iron deficiency anemia, GERD, who presents with heavy menstrual period, lightheadedness and dizziness.  Patient states that she has hx of of heavy menstrual period secondary to fibroids. She was seen by OB/GYN, and is planning to do ablation or hysterectomy on February. Pt states that her cycle started on Tuesday, which has been heavy. She developed dizziness and lightheadedness. She feels like she is going to pass out, but did not. Patient denies chest pain or shortness of breath. She has nausea, no vomiting, diarrhea or abdominal pain. Denies symptoms of UTI or unilateral weakness.  ED Course: pt was found to have Hemoglobin dropped from 9.6 on 09/18/17-7.7, negative pregnancy test, creatinine normal, temperature 99.2, tachycardia, tachypnea, oxygen saturation 100% on room air. Patient is placed on telemetry bed for observation.  Review of Systems:   General: no fevers, chills, no body weight gain, has fatigue HEENT: no blurry vision, hearing changes or sore throat Respiratory: no dyspnea, coughing, wheezing CV: no chest pain, no palpitations GI: has nausea, no vomiting, abdominal pain, diarrhea, constipation GU: no dysuria, burning on urination, increased urinary frequency, hematuria  Ext: no leg edema Neuro: no unilateral weakness, numbness, or tingling, no vision change or hearing loss. Has Dizziness and lightheadedness. Skin: no rash, no skin tear. MSK: No muscle spasm, no deformity, no limitation of range of movement in spin Heme: No easy bruising.  Travel history: No recent  long distant travel.  Allergy:  Allergies  Allergen Reactions  . Bee Venom Hives and Swelling  . Penicillins Swelling    Has patient had a PCN reaction causing immediate rash, facial/tongue/throat swelling, SOB or lightheadedness with hypotension:unknown Has patient had a PCN reaction causing severe rash involving mucus membranes or skin necrosis: No Has patient had a PCN reaction that required hospitalization No Has patient had a PCN reaction occurring within the last 10 years: No If all of the above answers are "NO", then may proceed with Cephalosporin use.     Past Medical History:  Diagnosis Date  . Anemia   . Breast cancer ([place]) 10/2016  . Ectopic pregnancy   . Family history of melanoma   . Family history of stomach cancer   . Fibroids   . GERD (gastroesophageal reflux disease)   . Personal history of radiation therapy   . STD (sexually transmitted disease)    Chlamydia    Past Surgical History:  Procedure Laterality Date  . BREAST LUMPECTOMY    . BREAST LUMPECTOMY WITH RADIOACTIVE SEED AND SENTINEL LYMPH NODE BIOPSY Left 12/18/2016   Procedure: RADIOACTIVE SEED GUIDED LEFT BREAST LUMPECTOMY WITH LEFT AXILLARY SENTINEL LYMPH NODE BIOPSY;  Surgeon: [name], MD;  Location: [facility];  Service: General;  Laterality: Left;  . DILATION AND CURETTAGE OF UTERUS      Social History:  reports that  has never smoked. she has never used smokeless tobacco. She reports that she does not drink alcohol or use drugs.  Family History:  Family History  Problem Relation Age of Onset  . Diabetes Mother   . Hypertension Mother   . Heart  disease Father   . Heart attack Father   . Diabetes Sister   . Hypertension Sister   . Lung cancer Maternal Aunt   . Melanoma Maternal Uncle   . Lung cancer Maternal Uncle   . Stomach cancer Other        paternal great aunt  . Stomach cancer Cousin        father's maternal cousin  . Cancer Other        2 maternal great aunts with unknown  cancer     Prior to Admission medications   Medication Sig Start Date End Date Taking? Authorizing Provider  Biotin 1 MG CAPS Take 1 mg by mouth daily. 04/09/17   Nicholas Lose, MD  Cholecalciferol (VITAMIN D3) 5000 units CAPS Take 5,000 Units by mouth daily.    [provider]  Ferrous Sulfate (IRON) 325 (65 Fe) MG TABS Take 1 tablet by mouth 3 (three) times daily. 05/23/17   Debbe Odea, MD  mometasone (ELOCON) 0.1 % cream Apply 1 application topically daily. 06/20/17   [provider]  Multiple Vitamin (MULTIVITAMIN) tablet Take 1 tablet by mouth daily.    [provider]  naproxen sodium (ANAPROX) 220 MG tablet Take 220 mg by mouth 2 (two) times daily as needed (pain).    [provider]  nystatin cream (MYCOSTATIN) Apply 1 application topically 2 (two) times daily.    [provider]  tranexamic acid (LYSTEDA) 650 MG TABS tablet as needed. 09/29/17   [provider]    Physical Exam: Vitals:   11/21/17 1634 11/21/17 1851  BP: 135/80 (!) 130/97  Pulse: (!) 117 (!) 129  Resp: 18 20  Temp: 99.2 F (37.3 C) 98.5 F (36.9 C)  TempSrc: Oral Oral  SpO2: 100% 100%   General: Not in acute distress. Pale looking. HEENT:       Eyes: PERRL, EOMI, no scleral icterus.       ENT: No discharge from the ears and nose, no pharynx injection, no tonsillar enlargement.        Neck: No JVD, no bruit, no mass felt. Heme: No neck lymph node enlargement. Cardiac: S1/S2, RRR, No murmurs, No gallops or rubs. Respiratory: No rales, wheezing, rhonchi or rubs. GI: Soft, nondistended, nontender, no rebound pain, no organomegaly, BS present. GU: No hematuria Ext: No pitting leg edema bilaterally. 2+DP/PT pulse bilaterally. Musculoskeletal: No joint deformities, No joint redness or warmth, no limitation of ROM in spin. Skin: No rashes.  Neuro: Alert, oriented X3, cranial nerves II-XII grossly intact, moves all extremities normally. Psych: Patient is  not psychotic, no suicidal or hemocidal ideation.  Labs on Admission: I have personally reviewed following labs and imaging studies  CBC: Recent Labs  Lab 11/21/17 1646  WBC 8.6  HGB 7.7*  HCT 22.2*  MCV 80.1  PLT 761   Basic Metabolic Panel: Recent Labs  Lab 11/21/17 1646  NA 141  K 3.9  CL 112*  CO2 26  GLUCOSE 117*  BUN 13  CREATININE 0.49  CALCIUM 8.3*   GFR: CrCl cannot be calculated (Unknown ideal weight.). Liver Function Tests: Recent Labs  Lab 11/21/17 1646  AST 17  ALT 14  ALKPHOS 37*  BILITOT 0.6  PROT 6.0*  ALBUMIN 3.4*   No results for input(s): LIPASE, AMYLASE in the last 168 hours. No results for input(s): AMMONIA in the last 168 hours. Coagulation Profile: No results for input(s): INR, PROTIME in the last 168 hours. Cardiac Enzymes: No results for input(s):  CKTOTAL, CKMB, CKMBINDEX, TROPONINI in the last 168 hours. BNP (last 3 results) No results for input(s): PROBNP in the last 8760 hours. HbA1C: No results for input(s): HGBA1C in the last 72 hours. CBG: No results for input(s): GLUCAP in the last 168 hours. Lipid Profile: No results for input(s): CHOL, HDL, LDLCALC, TRIG, CHOLHDL, LDLDIRECT in the last 72 hours. Thyroid Function Tests: No results for input(s): TSH, T4TOTAL, FREET4, T3FREE, THYROIDAB in the last 72 hours. Anemia Panel: No results for input(s): VITAMINB12, FOLATE, FERRITIN, TIBC, IRON, RETICCTPCT in the last 72 hours. Urine analysis:    Component Value Date/Time   COLORURINE YELLOW 10/23/2017 1249   APPEARANCEUR CLEAR 10/23/2017 1249   LABSPEC 1.025 10/23/2017 1249   PHURINE 5.5 10/23/2017 1249   GLUCOSEU NEGATIVE 10/23/2017 1249   HGBUR 1+ (A) 10/23/2017 1249   BILIRUBINUR NEGATIVE 05/28/2017 1229   KETONESUR NEGATIVE 10/23/2017 1249   PROTEINUR NEGATIVE 10/23/2017 1249   UROBILINOGEN 0.2 03/09/2015 1201   NITRITE NEGATIVE 05/28/2017 1229   LEUKOCYTESUR NEGATIVE 05/28/2017 1229   Sepsis  Labs: @LABRCNTIP (procalcitonin:4,lacticidven:4) )No results found for this or any previous visit (from the past 240 hour(s)).   Radiological Exams on Admission: No results found.   EKG:  Not done in ED, will get one.   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 Hemoglobin dropped from 9.6-7.7.Patient has tachycardia, lightheadedness and dizziness, but hemodynamically stable.  -will place on tele bed for obs -IVF: 2L NS and then 100 cc/h -will give 3 dose of tranexamic acid (1300 mg tid) -check orthostatic signs -will transfuse 2 U of blood -check TSH, iron and TIBC -continue Iron supplement  Fibroid: pt was seen by OB/GYN, and is planning to do ablation or hysterectomy on February. -encourage pt to follow up with OB/GYN to see if the procedure can be done earlier  Malignant neoplasm of upper-inner quadrant of left female breast [facility]): s/p of lumpectomy and radiation therapy. Patient is taking Tamoxifen.  Followed-up by Dr. Payton Mccallum. -continue Tamoxifen.    DVT ppx: SCD Code Status: Full code Family Communication:   Yes, patient's mother and boyfriend at bed side Disposition Plan:  Anticipate discharge back to previous home environment Consults called:  none Admission status: Obs / tele    Date of Service 11/21/2017    Ivor Costa [facility] Pager [phone]  If 7PM-7AM, please contact night-coverage [email or web address] Password [facility] 11/21/2017, 7:46 PM

## 2017-11-22 DIAGNOSIS — N924 Excessive bleeding in the premenopausal period: Secondary | ICD-10-CM | POA: Diagnosis not present

## 2017-11-22 DIAGNOSIS — E876 Hypokalemia: Secondary | ICD-10-CM | POA: Diagnosis not present

## 2017-11-22 DIAGNOSIS — C50212 Malignant neoplasm of upper-inner quadrant of left female breast: Secondary | ICD-10-CM | POA: Diagnosis not present

## 2017-11-22 DIAGNOSIS — D649 Anemia, unspecified: Secondary | ICD-10-CM | POA: Diagnosis not present

## 2017-11-22 DIAGNOSIS — I951 Orthostatic hypotension: Secondary | ICD-10-CM | POA: Diagnosis not present

## 2017-11-22 DIAGNOSIS — D219 Benign neoplasm of connective and other soft tissue, unspecified: Secondary | ICD-10-CM | POA: Diagnosis not present

## 2017-11-22 LAB — CBC
HCT: 24 % — ABNORMAL LOW (ref 36.0–46.0)
Hemoglobin: 8.3 g/dL — ABNORMAL LOW (ref 12.0–15.0)
MCH: 28.8 pg (ref 26.0–34.0)
MCHC: 34.6 g/dL (ref 30.0–36.0)
MCV: 83.3 fL (ref 78.0–100.0)
PLATELETS: 126 10*3/uL — AB (ref 150–400)
RBC: 2.88 MIL/uL — AB (ref 3.87–5.11)
RDW: 15.8 % — AB (ref 11.5–15.5)
WBC: 6.2 10*3/uL (ref 4.0–10.5)

## 2017-11-22 LAB — BASIC METABOLIC PANEL
Anion gap: 6 (ref 5–15)
BUN: 10 mg/dL (ref 6–20)
CALCIUM: 7.4 mg/dL — AB (ref 8.9–10.3)
CO2: 24 mmol/L (ref 22–32)
Chloride: 109 mmol/L (ref 101–111)
Creatinine, Ser: 0.51 mg/dL (ref 0.44–1.00)
Glucose, Bld: 113 mg/dL — ABNORMAL HIGH (ref 65–99)
POTASSIUM: 3.3 mmol/L — AB (ref 3.5–5.1)
SODIUM: 139 mmol/L (ref 135–145)

## 2017-11-22 LAB — IRON AND TIBC
Iron: 92 ug/dL (ref 28–170)
SATURATION RATIOS: 34 % — AB (ref 10.4–31.8)
TIBC: 273 ug/dL (ref 250–450)
UIBC: 181 ug/dL

## 2017-11-22 LAB — HIV ANTIBODY (ROUTINE TESTING W REFLEX): HIV SCREEN 4TH GENERATION: NONREACTIVE

## 2017-11-22 MED ORDER — POTASSIUM CHLORIDE CRYS ER 20 MEQ PO TBCR
40.0000 meq | EXTENDED_RELEASE_TABLET | Freq: Once | ORAL | Status: AC
  Administered 2017-11-22: 40 meq via ORAL
  Filled 2017-11-22: qty 2

## 2017-11-22 MED ORDER — SODIUM CHLORIDE 0.9 % IV BOLUS (SEPSIS)
1000.0000 mL | Freq: Once | INTRAVENOUS | Status: AC
  Administered 2017-11-22: 1000 mL via INTRAVENOUS

## 2017-11-22 NOTE — Progress Notes (Signed)
D/c to home w/ family via [place] voices no C/O.

## 2017-11-22 NOTE — Progress Notes (Signed)
All d/c instructions & belongings given w/ verbal understanding.Awaiting ride.

## 2017-11-22 NOTE — Plan of Care (Signed)
  Health Behavior/Discharge Planning: Ability to manage health-related needs will improve 11/22/2017 0030 - Progressing by Bertrum Sol, RN

## 2017-11-22 NOTE — Discharge Summary (Signed)
Physician Discharge Summary  PREET MANGANO OHY:[id number] DOB: 04/19/1976 DOA: 11/21/2017  PCP: [name], PA  Admit date: 11/21/2017 Discharge date: 11/22/2017  Time spent: 55 minutes  Recommendations for Outpatient Follow-up:  1. Follow-up with Dr. Phineas Real, OB/GYN as scheduled.  On follow-up patient will need a CBC done to follow-up on H&H.  Patient also need a basic metabolic profile done to follow-up on electrolytes and renal function.   Discharge Diagnoses:  Principal Problem:   Symptomatic anemia Active Problems:   Obesity (BMI 30.0-34.9)   Menorrhagia   Anemia   Malignant neoplasm of upper-inner quadrant of left female breast ([place])   Fibroid   Hypokalemia   Orthostasis   Discharge Condition: Stable and improved  Diet recommendation: Regular  Filed Weights   11/21/17 2125  Weight: 78.7 kg (173 lb 8 oz)    History of present illness:  Per Dr Lowanda Foster Donna Matthews is a 41 y.o. female with medical history significant of Fibroids, breast cancer, iron deficiency anemia, GERD, who presented with heavy menstrual period, lightheadedness and dizziness.  Patient stated that she has hx of of heavy menstrual period secondary to fibroids. She was seen by OB/GYN, and is planning to do ablation or hysterectomy on February. Pt states that her cycle started on Tuesday, which has been heavy. She developed dizziness and lightheadedness. She feels like she was going to pass out, but did not. Patient denied chest pain or shortness of breath. She has nausea, no vomiting, diarrhea or abdominal pain. Denied symptoms of UTI or unilateral weakness.  ED Course: pt was found to have Hemoglobin dropped from 9.6 on 09/18/17-7.7, negative pregnancy test, creatinine normal, temperature 99.2, tachycardia, tachypnea, oxygen saturation 100% on room air. Patient was placed on telemetry bed for observation.    Hospital Course:  1 symptomatic anemia secondary to menorrhagia Patient was admitted with a  symptomatic anemia with lightheadedness and dizziness as patient was noted to have heavy menstrual periods which usually occurs.  Patient's hemoglobin had dropped from 9.6-7.7 on admission, patient was noted to be tachycardic some lightheadedness and dizziness.  Patient was also noted to be orthostatic by pulse.  Patient was placed on IV fluids placed on telemetry bed and given 3 doses of tranexamic acid 1300 mg p.o. 3 times daily and transfused 2 units of packed red blood cells.  Patient was also maintained on oral iron supplement.  TSH obtained was within normal limits.  Patient had no signs or symptoms of infection.  Iron levels obtained was 92, TIBC of 273.  Patient improved clinically posttransfusion with a hemoglobin of 8.3 by day of discharge.  Repeat orthostatics were obtained which were negative.  Patient had no further dizziness or lightheadedness and patient was discharged in stable and improved condition.  Patient is to follow-up with her OB/GYN as scheduled.  On follow-up patient will likely need a CBC done to follow-up on H&H.  2.  Acute blood loss anemia Secondary to problem #1.  3.  Orthostasis Secondary to problem #1.  Patient had presented with orthostasis, tachycardia, lightheadedness and noted to have a hemoglobin of 7.7 from 9.6.  Patient was noted to be orthostatic by pulse.  Patient was placed on IV fluids transfused 2 units of packed red blood cells with resolution of orthostasis.  Patient improved clinically and was back to baseline by day of discharge.  4.  Hypokalemia Repleted.  5.  Fibroids Patient has been seen by OB/GYN and is planning to do ablation or hysterectomy  in February 2019.  Outpatient follow-up.  6.  Malignant neoplasm of upper-inner quadrant of left breast Status post lumpectomy and radiation therapy.  Patient was continued on home regimen of tamoxifen.  Outpatient follow-up with oncology.  Procedures:  2 units packed red blood cells  11/21/2017    Consultations:  None  Discharge Exam: Vitals:   11/22/17 0648 11/22/17 0849  BP: (!) 102/58   Pulse: 84   Resp: 15 18  Temp: 98.3 F (36.8 C)   SpO2: 100%     General: NAD Cardiovascular: RRR Respiratory: CTAB  Discharge Instructions   Discharge Instructions    Diet general   Complete by:  As directed    Increase activity slowly   Complete by:  As directed      Allergies as of 11/22/2017      Reactions   Bee Venom Hives, Swelling   Penicillins Swelling   Has patient had a PCN reaction causing immediate rash, facial/tongue/throat swelling, SOB or lightheadedness with hypotension:unknown Has patient had a PCN reaction causing severe rash involving mucus membranes or skin necrosis: No Has patient had a PCN reaction that required hospitalization No Has patient had a PCN reaction occurring within the last 10 years: No If all of the above answers are "NO", then may proceed with Cephalosporin use.      Medication List    TAKE these medications   Biotin 1 MG Caps Take 1 mg by mouth daily.   Iron 325 (65 Fe) MG Tabs Take 1 tablet by mouth 3 (three) times daily.   multivitamin tablet Take 1 tablet by mouth daily.   nystatin cream Commonly known as:  MYCOSTATIN Apply 1 application topically 2 (two) times daily.   tamoxifen 20 MG tablet Commonly known as:  NOLVADEX Take 20 mg by mouth daily.   tranexamic acid 650 MG Tabs tablet Commonly known as:  LYSTEDA Take 1,300 mg by mouth 3 (three) times daily. 1300 mg by mouth three times a day during the week of her cycle   Vitamin D3 5000 units Caps Take 5,000 Units by mouth daily.      Allergies  Allergen Reactions  . Bee Venom Hives and Swelling  . Penicillins Swelling    Has patient had a PCN reaction causing immediate rash, facial/tongue/throat swelling, SOB or lightheadedness with hypotension:unknown Has patient had a PCN reaction causing severe rash involving mucus membranes or skin  necrosis: No Has patient had a PCN reaction that required hospitalization No Has patient had a PCN reaction occurring within the last 10 years: No If all of the above answers are "NO", then may proceed with Cephalosporin use.    Follow-up Information    Fontaine, Belinda Block, MD Follow up.   Specialties:  Gynecology, Radiology Why:  F/U AS SCHEDULED Contact information: [address] [place] [address] [phone]            The results of significant diagnostics from this hospitalization (including imaging, microbiology, ancillary and laboratory) are listed below for reference.    Significant Diagnostic Studies: Mm Diag Breast Tomo Bilateral  Result Date: 10/28/2017 CLINICAL DATA:  History of left breast cancer status post lumpectomy in 2018. EXAM: 2D DIGITAL DIAGNOSTIC BILATERAL MAMMOGRAM WITH CAD AND ADJUNCT TOMO COMPARISON:  Previous exam(s). ACR Breast Density Category c: The breast tissue is heterogeneously dense, which may obscure small masses. FINDINGS: There are lumpectomy changes in the left breast. No suspicious mass or malignant type microcalcifications identified in either breast. Mammographic  images were processed with CAD. IMPRESSION: No evidence of malignancy in either breast. RECOMMENDATION: Bilateral diagnostic mammogram in 1 year is recommended. I have discussed the findings and recommendations with the patient. Results were also provided in writing at the conclusion of the visit. If applicable, a reminder letter will be sent to the patient regarding the next appointment. BI-RADS CATEGORY  2: Benign. Electronically Signed   By: Lillia Mountain M.D.   On: 10/28/2017 10:07    Microbiology: No results found for this or any previous visit (from the past 240 hour(s)).   Labs: Basic Metabolic Panel: Recent Labs  Lab 11/21/17 1646 11/22/17 0656  NA 141 139  K 3.9 3.3*  CL 112* 109  CO2 26 24  GLUCOSE 117* 113*  BUN 13 10  CREATININE 0.49 0.51   CALCIUM 8.3* 7.4*   Liver Function Tests: Recent Labs  Lab 11/21/17 1646  AST 17  ALT 14  ALKPHOS 37*  BILITOT 0.6  PROT 6.0*  ALBUMIN 3.4*   No results for input(s): LIPASE, AMYLASE in the last 168 hours. No results for input(s): AMMONIA in the last 168 hours. CBC: Recent Labs  Lab 11/21/17 1646 11/22/17 0656  WBC 8.6 6.2  HGB 7.7* 8.3*  HCT 22.2* 24.0*  MCV 80.1 83.3  PLT 197 126*   Cardiac Enzymes: No results for input(s): CKTOTAL, CKMB, CKMBINDEX, TROPONINI in the last 168 hours. BNP: BNP (last 3 results) No results for input(s): BNP in the last 8760 hours.  ProBNP (last 3 results) No results for input(s): PROBNP in the last 8760 hours.  CBG: No results for input(s): GLUCAP in the last 168 hours.     Signed:  Irine Seal MD.  [facility] 11/22/2017, 12:15 PM

## 2017-11-22 NOTE — Care Management Note (Signed)
Case Management Note  Patient Details  Name: Donna Matthews MRN: [id number] Date of Birth: 05-May-1976  Subjective/Objective: 41 y/o f admitted w/Symptomatic anemia. From home. No CM needs.                   Action/Plan:d/c home.   Expected Discharge Date:  11/22/17               Expected Discharge Plan:  Home/Self Care  In-House Referral:     Discharge planning Services  CM Consult  Post Acute Care Choice:    Choice offered to:     DME Arranged:    DME Agency:     HH Arranged:    HH Agency:     Status of Service:  Completed, signed off  If discussed at [employer] of Stay Meetings, dates discussed:    Additional Comments:  Dessa Phi, RN 11/22/2017, 12:58 PM

## 2017-11-24 LAB — TYPE AND SCREEN
ABO/RH(D): O POS
ANTIBODY SCREEN: NEGATIVE
UNIT DIVISION: 0
Unit division: 0

## 2017-11-24 LAB — BPAM RBC
BLOOD PRODUCT EXPIRATION DATE: 201901082359
BLOOD PRODUCT EXPIRATION DATE: 201901082359
ISSUE DATE / TIME: 201812132130
ISSUE DATE / TIME: 201812140103
UNIT TYPE AND RH: 5100
Unit Type and Rh: 5100

## 2017-11-26 ENCOUNTER — Telehealth: Payer: Self-pay

## 2017-11-26 ENCOUNTER — Encounter: Payer: Self-pay | Admitting: Women's Health

## 2017-11-26 ENCOUNTER — Ambulatory Visit (INDEPENDENT_AMBULATORY_CARE_PROVIDER_SITE_OTHER): Payer: 59 | Admitting: Women's Health

## 2017-11-26 VITALS — BP 126/80 | Ht 64.0 in | Wt 178.0 lb

## 2017-11-26 DIAGNOSIS — Z113 Encounter for screening for infections with a predominantly sexual mode of transmission: Secondary | ICD-10-CM

## 2017-11-26 DIAGNOSIS — Z01419 Encounter for gynecological examination (general) (routine) without abnormal findings: Secondary | ICD-10-CM | POA: Diagnosis not present

## 2017-11-26 MED ORDER — MEGESTROL ACETATE 40 MG PO TABS: 40.0000 mg | ORAL_TABLET | Freq: Two times a day (BID) | ORAL | 0 refills | Status: DC

## 2017-11-26 NOTE — Telephone Encounter (Signed)
I called patient to discuss the following from Dr. Loetta Rough with her. I have Feb schedule and originally with her Lupron injection we discussed surgery 01/14/18 and now we need to consider this.  "Her hemoglobin is still relatively low at 8. Check with her to see where she is doing bleeding lysed and the make sure she is on extra iron daily. 2 options would be to proceed with the myomectomy over the next month or so and accept the increased risk of transfusion. Alternative would be another 3 months of Depo-Lupron assuming she is not bleeding heavily and continue with iron supplements and then do it at that point hopefully recovering more hemoglobin preoperatively. If she does want to proceed now with surgery I would recommend a Feraheme iron infusion several weeks preoperatively. Can be arranged through the infusion center at [facility]. Let me know which route she wants to go and will put in the surgery slip."  I read this to patient. Patient said that she has Lupron 11/04/17 and on 11/19/17 began bleed. By 11/21/17 it was so heavy she went to Emergency Room. When she got there her hgb was 7.7 and they transfused her two units and when she left it was around 8.  Today she is still bleeding but very lightly.  She is seeing [place] today for her yearly exam. She wants to consider all of this and I will hold Feb 5th and she will let me know what she decides.

## 2017-11-26 NOTE — Patient Instructions (Signed)
Uterine Fibroids Uterine fibroids are tissue masses (tumors) that can develop in the womb (uterus). They are also called leiomyomas. This type of tumor is not cancerous (benign) and does not spread to other parts of the body outside of the pelvic area, which is between the hip bones. Occasionally, fibroids may develop in the fallopian tubes, in the cervix, or on the support structures (ligaments) that surround the uterus. You can have one or many fibroids. Fibroids can vary in size, weight, and where they grow in the uterus. Some can become quite large. Most fibroids do not require medical treatment. What are the causes? A fibroid can develop when a single uterine cell keeps growing (replicating). Most cells in the human body have a control mechanism that keeps them from replicating without control. What are the signs or symptoms? Symptoms may include:  Heavy bleeding during your period.  Bleeding or spotting between periods.  Pelvic pain and pressure.  Bladder problems, such as needing to urinate more often (urinary frequency) or urgently.  Inability to reproduce offspring (infertility).  Miscarriages.  How is this diagnosed? Uterine fibroids are diagnosed through a physical exam. Your health care provider may feel the lumpy tumors during a pelvic exam. Ultrasonography and an MRI may be done to determine the size, location, and number of fibroids. How is this treated? Treatment may include:  Watchful waiting. This involves getting the fibroid checked by your health care provider to see if it grows or shrinks. Follow your health care provider's recommendations for how often to have this checked.  Hormone medicines. These can be taken by mouth or given through an intrauterine device (IUD).  Surgery. ? Removing the fibroids (myomectomy) or the uterus (hysterectomy). ? Removing blood supply to the fibroids (uterine artery embolization).  If fibroids interfere with your fertility and you  want to become pregnant, your health care provider may recommend having the fibroids removed. Follow these instructions at home:  Keep all follow-up visits as directed by your health care provider. This is important.  Take over-the-counter and prescription medicines only as told by your health care provider. ? If you were prescribed a hormone treatment, take the hormone medicines exactly as directed.  Ask your health care provider about taking iron pills and increasing the amount of dark green, leafy vegetables in your diet. These actions can help to boost your blood iron levels, which may be affected by heavy menstrual bleeding.  Pay close attention to your period and tell your health care provider about any changes, such as: ? Increased blood flow that requires you to use more pads or tampons than usual per month. ? A change in the number of days that your period lasts per month. ? A change in symptoms that are associated with your period, such as abdominal cramping or back pain. Contact a health care provider if:  You have pelvic pain, back pain, or abdominal cramps that cannot be controlled with medicines.  You have an increase in bleeding between and during periods.  You soak tampons or pads in a half hour or less.  You feel lightheaded, extra tired, or weak. Get help right away if:  You faint.  You have a sudden increase in pelvic pain. This information is not intended to replace advice given to you by your health care provider. Make sure you discuss any questions you have with your health care provider. Document Released: 11/23/2000 Document Revised: 07/26/2016 Document Reviewed: 05/25/2014 Elsevier Interactive Patient Education  2018 [employer]  

## 2017-11-27 LAB — COMPREHENSIVE METABOLIC PANEL
AG RATIO: 1.7 (calc) (ref 1.0–2.5)
ALT: 12 U/L (ref 6–29)
AST: 13 U/L (ref 10–30)
Albumin: 3.7 g/dL (ref 3.6–5.1)
Alkaline phosphatase (APISO): 41 U/L (ref 33–115)
BILIRUBIN TOTAL: 0.3 mg/dL (ref 0.2–1.2)
BUN / CREAT RATIO: 10 (calc) (ref 6–22)
BUN: 6 mg/dL — ABNORMAL LOW (ref 7–25)
CALCIUM: 8.7 mg/dL (ref 8.6–10.2)
CHLORIDE: 109 mmol/L (ref 98–110)
CO2: 25 mmol/L (ref 20–32)
Creat: 0.59 mg/dL (ref 0.50–1.10)
GLOBULIN: 2.2 g/dL (ref 1.9–3.7)
GLUCOSE: 93 mg/dL (ref 65–99)
Potassium: 4.1 mmol/L (ref 3.5–5.3)
Sodium: 141 mmol/L (ref 135–146)
Total Protein: 5.9 g/dL — ABNORMAL LOW (ref 6.1–8.1)

## 2017-11-27 LAB — CBC WITH DIFFERENTIAL/PLATELET
BASOS ABS: 18 {cells}/uL (ref 0–200)
BASOS PCT: 0.3 %
EOS ABS: 72 {cells}/uL (ref 15–500)
Eosinophils Relative: 1.2 %
HEMATOCRIT: 28.1 % — AB (ref 35.0–45.0)
HEMOGLOBIN: 9.6 g/dL — AB (ref 11.7–15.5)
LYMPHS ABS: 978 {cells}/uL (ref 850–3900)
MCH: 28.2 pg (ref 27.0–33.0)
MCHC: 34.2 g/dL (ref 32.0–36.0)
MCV: 82.6 fL (ref 80.0–100.0)
MPV: 10.6 fL (ref 7.5–12.5)
Monocytes Relative: 8.7 %
NEUTROS ABS: 4410 {cells}/uL (ref 1500–7800)
Neutrophils Relative %: 73.5 %
Platelets: 266 10*3/uL (ref 140–400)
RBC: 3.4 10*6/uL — ABNORMAL LOW (ref 3.80–5.10)
RDW: 16.2 % — ABNORMAL HIGH (ref 11.0–15.0)
Total Lymphocyte: 16.3 %
WBC: 6 10*3/uL (ref 3.8–10.8)
WBCMIX: 522 {cells}/uL (ref 200–950)

## 2017-11-27 LAB — HIV ANTIBODY (ROUTINE TESTING W REFLEX): HIV: NONREACTIVE

## 2017-11-27 LAB — HEPATITIS C ANTIBODY
Hepatitis C Ab: NONREACTIVE
SIGNAL TO CUT-OFF: 0.01 (ref <1.00)

## 2017-11-27 LAB — RPR: RPR: NONREACTIVE

## 2017-11-27 LAB — HEPATITIS B SURFACE ANTIGEN: Hepatitis B Surface Ag: NONREACTIVE

## 2017-11-27 NOTE — Progress Notes (Signed)
Donna Matthews 24-May-1976 [id number]    History:    Presents for annual exam.  Monthly heavy cycles causing anemia. Desiring conception. Hospitalized 11/21/2017 for anemia caused by menorrhagia was given 2 units of blood. Is scheduled for myomectomy of multiple large 7 cm, 6 cm, 6 cm fibroids  01/14/2017 with Dr. Phineas Real. She had Depo-Lupron 11/04/2017. She is planning to receive a Feraheme iron infusion 2 weeks preoperatively. Significant history is  left breast cancer with negative BRCA 10/2016 was treated with lumpectomy and radiation therapy. She has follow-up with Dr.Gudena end of January and will discuss myomectomy, tamoxifen and pregnancy risks. 1998 LG SIL normal Paps after. History of an ectopic and SAB.  Past medical history, past surgical history, family history and social history were all reviewed and documented in the EPIC chart. Works in Insurance underwriter. Mother, sister diabetes and hypertension. Father died from  MI at 25.  ROS:  A ROS was performed and pertinent positives and negatives are included.  Exam:  Vitals:   11/26/17 1442  BP: 126/80  Weight: 178 lb (80.7 kg)  Height: _0  (1.626 m)   Body mass index is 30.55 kg/m.   General appearance:  Normal Thyroid:  Symmetrical, normal in size, without palpable masses or nodularity. Respiratory  Auscultation:  Clear without wheezing or rhonchi Cardiovascular  Auscultation:  Regular rate, without rubs, murmurs or gallops  Edema/varicosities:  Not grossly evident Abdominal  Soft,nontender, without masses, guarding or rebound.  Liver/spleen:  No organomegaly noted  Hernia:  None appreciated  Skin  Inspection:  Grossly normal   Breasts: Examined lying and sitting.     Right: Without masses, retractions, discharge or axillary adenopathy.     Left: Without masses, retractions, discharge or axillary adenopathy. Gentitourinary   Inguinal/mons:  Normal without inguinal adenopathy  External genitalia:  Normal  BUS/Urethra/Skene's  glands:  Normal  Vagina:  Normal  Cervix:  Normal  Uterus: Enlarged fibroid uterus 18-20 weeks' size, shape and contour.  Midline and mobile  Adnexa/parametria:     Rt: Without masses or tenderness.   Lt: Without masses or tenderness.  Anus and perineum: Normal  Digital rectal exam: Normal sphincter tone without palpated masses or tenderness  Assessment/Plan:  41 y.o. SBF G2 P0 for annual exam.   Large fibroid uterus - 01/14/2017 scheduled myomectomy with Dr. Phineas Real Anemia 10/2017 Left breast cancer lumpectomy/radiation-BRCA negative  Plan: Keep scheduled follow-up with oncologist to discuss tamoxifen, pregnancy risks and myomectomy. SBE's, continue annual screening mammogram, normal last month. Will schedule iron infusion 2 weeks prior to myomectomy. Continue iron supplements 3 times daily and iron rich foods in diet. Prescription for Megace 40 mg to take twice daily if cycle starts in January. Prenatal vitamin daily encouraged. CBC, CMP, Pap with HR HPV typing, new screening guidelines reviewed.   [place], 7:13 AM 11/27/2017

## 2017-11-28 NOTE — Telephone Encounter (Signed)
Patient saw [place] and was discussed with Dr. Loetta Rough. Patient is going to go ahead with surgery on 01/14/18. We discussed her estimated surgery prepymt amount and financial arrangements for that were made with Gretta Arab. I will be sending her a financial letter confirming that.  Surgery was scheduled for Feb, 5 at 7:30am at [facility]  We will plan iron infusion for two weeks prior to surgery. Patient knows I will be back with her about that. Pre op consult scheduled for 01/09/18 at 3:30pm.

## 2017-12-02 LAB — PAP IG, CT-NG NAA, HPV HIGH-RISK
C. trachomatis RNA, TMA: NOT DETECTED
HPV DNA HIGH RISK: NOT DETECTED
N. gonorrhoeae RNA, TMA: NOT DETECTED

## 2017-12-16 ENCOUNTER — Telehealth: Payer: Self-pay | Admitting: *Deleted

## 2017-12-16 NOTE — Telephone Encounter (Signed)
Okay to watch for now, she did have Lupron and did have a cycle after the Lupron. If it continues with just spotting watch, if it starts to have any kind of flow start the Megace. She is scheduled for myomectomy.

## 2017-12-16 NOTE — Telephone Encounter (Signed)
Pt informed

## 2017-12-16 NOTE — Telephone Encounter (Signed)
Patient started spotting, no flow yet, has Rx for megace 40 mg tablet to take if cycle starts. Pt wanted to confirm okay to start megace now since spotting and no flow. Please advise

## 2017-12-18 ENCOUNTER — Encounter: Payer: Self-pay | Admitting: Gynecology

## 2017-12-19 ENCOUNTER — Telehealth: Payer: Self-pay

## 2017-12-19 NOTE — Telephone Encounter (Signed)
I faxed order for Feraheme infusion and followed it with a phone call to [facility] in Short Stay.  Per Dr. Loetta Rough he wanted to schedule "one infusion and repeat 8 days later with second one week prior to surgery".  That would be 1/21 and 1/28 with surgery on 01/14/18.  [facility] scheduled her for 1/21/9 at 9:00am.  She said their protocol is not to schedule the 2nd infusion until they see how she does with the first one. She promised me that they will get her scheduled for 1/28 if she does well with her 1/21 infusion.  Left message for patient to call me.

## 2017-12-23 ENCOUNTER — Telehealth: Payer: Self-pay

## 2017-12-23 NOTE — Telephone Encounter (Signed)
Patient returned my call and  I informed her about iron infusion appointment for 12/30/17 at [place]. I advised her Dr. Loetta Rough wants her to have 2nd one week before surgery (01/06/18) but that they did not want to schedule it until they see how she did with the first. I told her not to leave without them scheduling her an appointment if she does well with it.

## 2017-12-27 ENCOUNTER — Other Ambulatory Visit (HOSPITAL_COMMUNITY): Payer: Self-pay | Admitting: *Deleted

## 2017-12-27 ENCOUNTER — Encounter: Payer: Self-pay | Admitting: Gynecology

## 2017-12-28 IMAGING — MG 2D DIGITAL DIAGNOSTIC BILATERAL MAMMOGRAM WITH CAD AND ADJUNCT T
9 of 13 series · 9 of 29 positions shown · non-contrast
Comparison: Previous exam(s).

CLINICAL DATA: History of left breast cancer status post lumpectomy
in 2675.

EXAM:
2D DIGITAL DIAGNOSTIC BILATERAL MAMMOGRAM WITH CAD AND ADJUNCT TOMO

[L CC (1 of 2)]
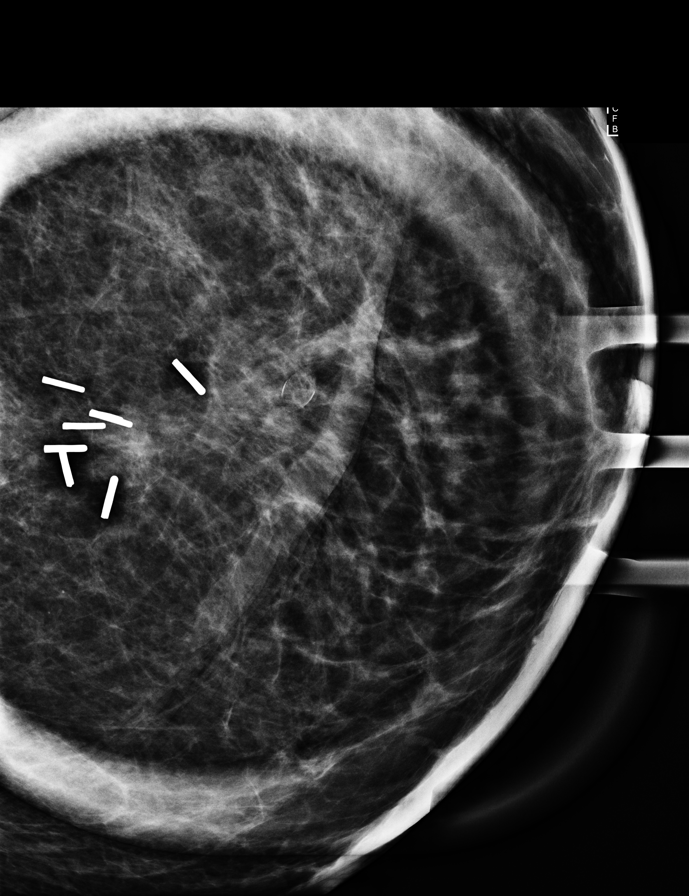

[R MLO]
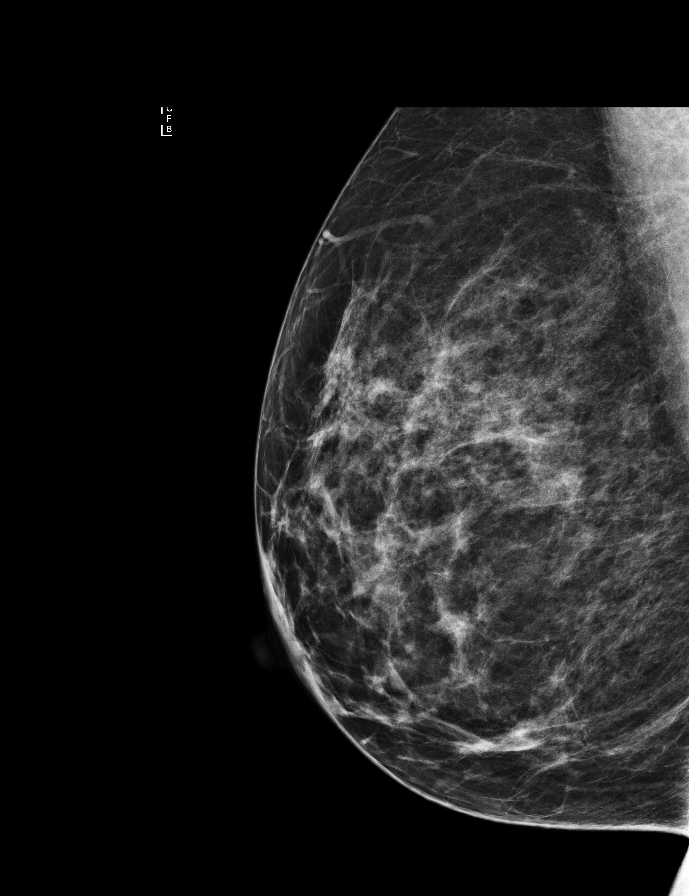

[R CC synth-2D]
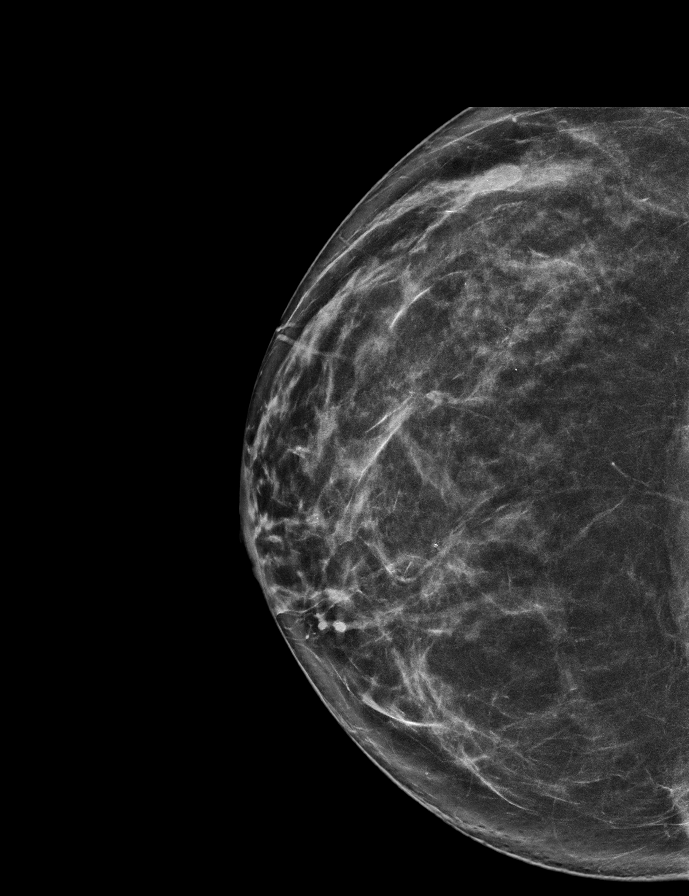

[L CC (2 of 2)]
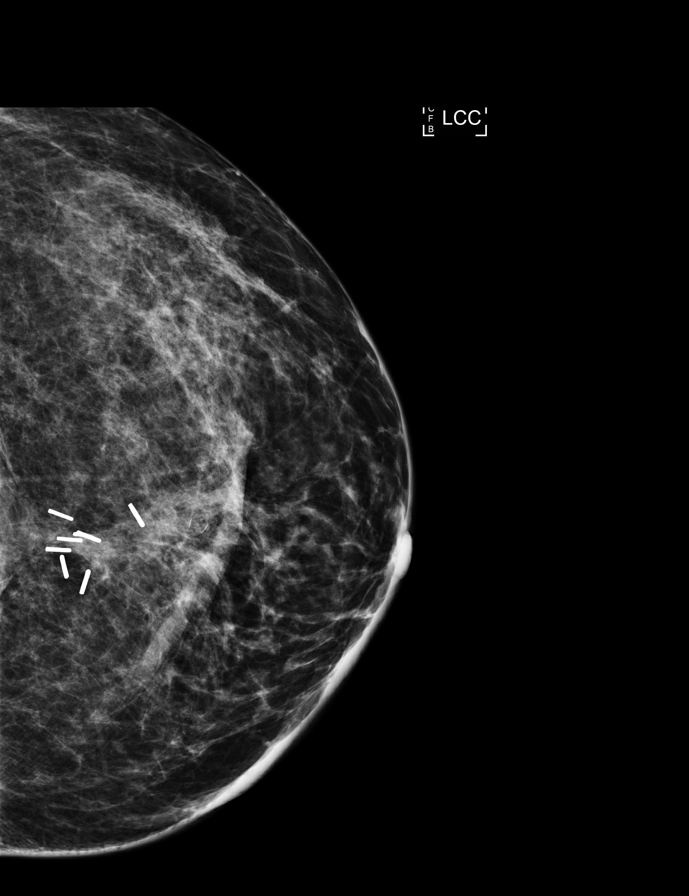

[R CC]
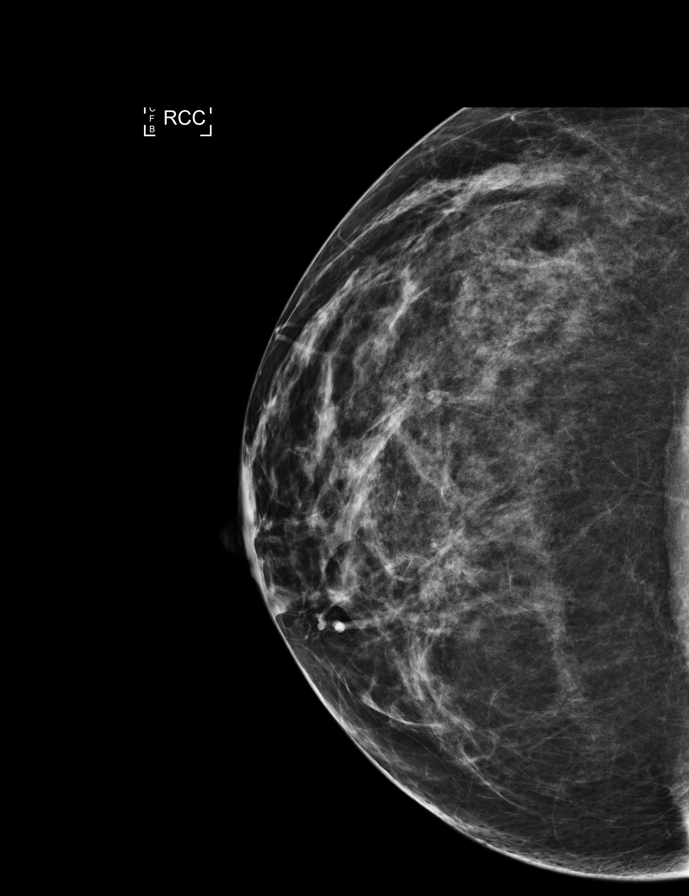

[L MLO]
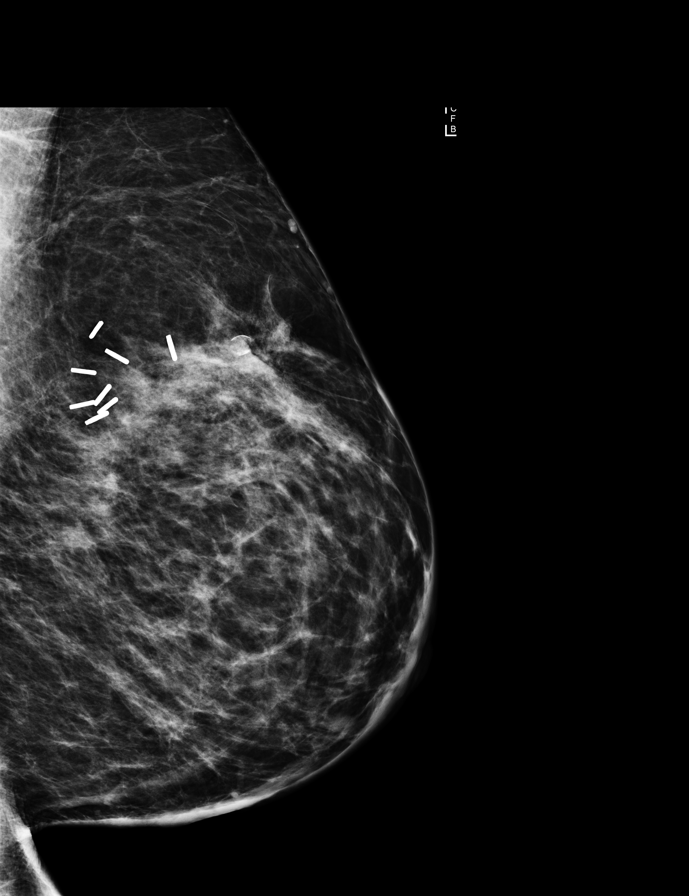

[L MLO synth-2D]
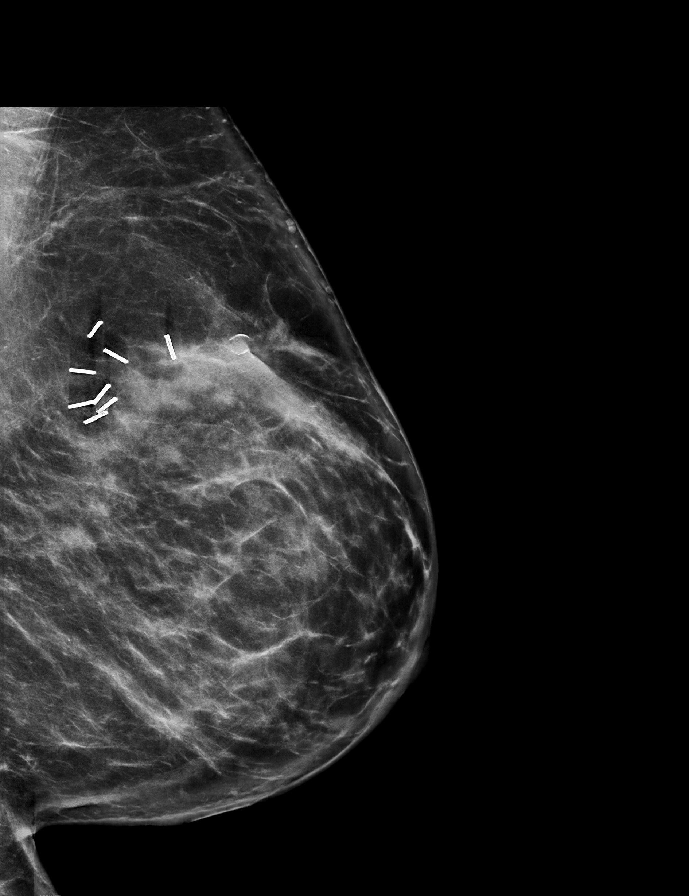

[R MLO synth-2D]
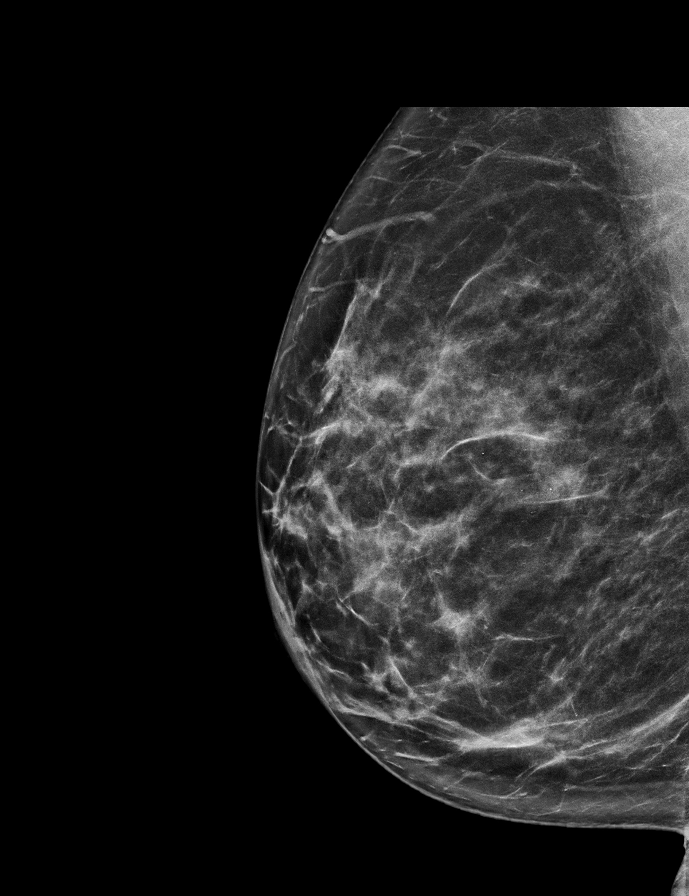

[L CC synth-2D]
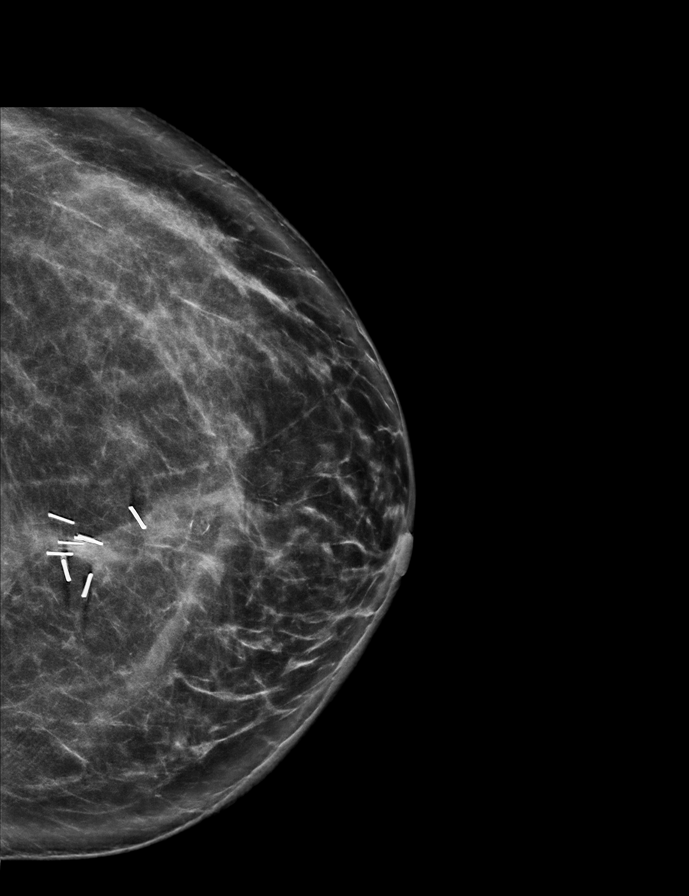

[9 of 29 positions shown; findings below may reference images not displayed]

ACR Breast Density Category c: The breast tissue is heterogeneously
dense, which may obscure small masses.
FINDINGS: There are lumpectomy changes in the left breast. No suspicious mass
or malignant type microcalcifications identified in either breast.

Mammographic images were processed with CAD.
IMPRESSION: No evidence of malignancy in either breast.

RECOMMENDATION:
Bilateral diagnostic mammogram in 1 year is recommended.

I have discussed the findings and recommendations with the patient.
Results were also provided in writing at the conclusion of the
visit. If applicable, a reminder letter will be sent to the patient
regarding the next appointment.

BI-RADS CATEGORY  2: Benign.

## 2017-12-30 ENCOUNTER — Ambulatory Visit (HOSPITAL_COMMUNITY)
Admission: RE | Admit: 2017-12-30 | Discharge: 2017-12-30 | Disposition: A | Discharge status: Home / Self Care | Payer: 59 | Source: Ambulatory Visit | Attending: Gynecology | Admitting: Gynecology

## 2017-12-30 DIAGNOSIS — D509 Iron deficiency anemia, unspecified: Secondary | ICD-10-CM | POA: Insufficient documentation

## 2017-12-30 MED ORDER — FERUMOXYTOL INJECTION 510 MG/17 ML
510.0000 mg | INTRAVENOUS | Status: DC
  Administered 2017-12-30: 510 mg via INTRAVENOUS
  Filled 2017-12-30: qty 17

## 2018-01-03 ENCOUNTER — Other Ambulatory Visit (HOSPITAL_COMMUNITY): Payer: Self-pay | Admitting: *Deleted

## 2018-01-03 NOTE — Patient Instructions (Addendum)
Your procedure is scheduled on: Tuesday January 14, 2018 at 10:30 am  Enter through the Main Entrance of [facility] at: 9:00 am  Pick up the phone at the desk and dial [phone].  Call this number if you have problems the morning of surgery: [phone].  Remember: Do NOT eat food or drink any liquids after: Midnight on Monday February 4  Take these medicines the morning of surgery with a SIP OF WATER: None  STOP ALL HERBAL MEDICATIONS AND SUPPLEMENTS 1 WEEK PRIOR TO SURGERY  DO NOT SMOKE DAY OF SURGERY    Do NOT wear jewelry (body piercing), metal hair clips/bobby pins, make-up, or nail polish. Do NOT wear lotions, powders, or perfumes.  You may wear deoderant. Do NOT shave for 48 hours prior to surgery. Do NOT bring valuables to the hospital. Contacts, dentures, or bridgework may not be worn into surgery.  Leave suitcase in car.  After surgery it may be brought to your room.  For patients admitted to the hospital, checkout time is 11:00 AM the day of discharge.

## 2018-01-06 ENCOUNTER — Ambulatory Visit (HOSPITAL_COMMUNITY)
Admission: RE | Admit: 2018-01-06 | Discharge: 2018-01-06 | Disposition: A | Discharge status: Home / Self Care | Payer: 59 | Source: Ambulatory Visit | Attending: Gynecology | Admitting: Gynecology

## 2018-01-06 DIAGNOSIS — N920 Excessive and frequent menstruation with regular cycle: Secondary | ICD-10-CM | POA: Diagnosis not present

## 2018-01-06 DIAGNOSIS — D259 Leiomyoma of uterus, unspecified: Secondary | ICD-10-CM | POA: Insufficient documentation

## 2018-01-06 DIAGNOSIS — D509 Iron deficiency anemia, unspecified: Secondary | ICD-10-CM | POA: Insufficient documentation

## 2018-01-06 MED ORDER — SODIUM CHLORIDE 0.9 % IV SOLN
510.0000 mg | INTRAVENOUS | Status: DC
  Administered 2018-01-06: 510 mg via INTRAVENOUS
  Filled 2018-01-06: qty 17

## 2018-01-08 ENCOUNTER — Encounter (HOSPITAL_COMMUNITY): Payer: Self-pay

## 2018-01-08 ENCOUNTER — Encounter: Payer: Self-pay | Admitting: Gynecology

## 2018-01-08 ENCOUNTER — Encounter (HOSPITAL_COMMUNITY)
Admission: RE | Admit: 2018-01-08 | Discharge: 2018-01-08 | Disposition: A | Discharge status: Home / Self Care | Payer: 59 | Source: Ambulatory Visit | Attending: Gynecology | Admitting: Gynecology

## 2018-01-08 ENCOUNTER — Other Ambulatory Visit: Payer: Self-pay

## 2018-01-08 DIAGNOSIS — Z808 Family history of malignant neoplasm of other organs or systems: Secondary | ICD-10-CM | POA: Insufficient documentation

## 2018-01-08 DIAGNOSIS — Z01812 Encounter for preprocedural laboratory examination: Secondary | ICD-10-CM | POA: Diagnosis not present

## 2018-01-08 DIAGNOSIS — D250 Submucous leiomyoma of uterus: Secondary | ICD-10-CM | POA: Diagnosis not present

## 2018-01-08 DIAGNOSIS — R739 Hyperglycemia, unspecified: Secondary | ICD-10-CM | POA: Diagnosis not present

## 2018-01-08 DIAGNOSIS — Z800 Family history of malignant neoplasm of digestive organs: Secondary | ICD-10-CM | POA: Insufficient documentation

## 2018-01-08 DIAGNOSIS — D649 Anemia, unspecified: Secondary | ICD-10-CM | POA: Insufficient documentation

## 2018-01-08 DIAGNOSIS — N920 Excessive and frequent menstruation with regular cycle: Secondary | ICD-10-CM | POA: Insufficient documentation

## 2018-01-08 DIAGNOSIS — E669 Obesity, unspecified: Secondary | ICD-10-CM | POA: Insufficient documentation

## 2018-01-08 HISTORY — DX: Nausea with vomiting, unspecified: R11.2

## 2018-01-08 HISTORY — DX: Other specified postprocedural states: Z98.890

## 2018-01-08 LAB — CBC
HCT: 33.6 % — ABNORMAL LOW (ref 36.0–46.0)
Hemoglobin: 11.7 g/dL — ABNORMAL LOW (ref 12.0–15.0)
MCH: 27.8 pg (ref 26.0–34.0)
MCHC: 34.8 g/dL (ref 30.0–36.0)
MCV: 79.8 fL (ref 78.0–100.0)
PLATELETS: 219 10*3/uL (ref 150–400)
RBC: 4.21 MIL/uL (ref 3.87–5.11)
RDW: 15.4 % (ref 11.5–15.5)
WBC: 6 10*3/uL (ref 4.0–10.5)

## 2018-01-08 LAB — TYPE AND SCREEN
ABO/RH(D): O POS
ANTIBODY SCREEN: NEGATIVE

## 2018-01-08 LAB — ABO/RH: ABO/RH(D): O POS

## 2018-01-09 ENCOUNTER — Ambulatory Visit: Payer: 59 | Admitting: Gynecology

## 2018-01-09 ENCOUNTER — Ambulatory Visit (INDEPENDENT_AMBULATORY_CARE_PROVIDER_SITE_OTHER): Payer: 59 | Admitting: Gynecology

## 2018-01-09 ENCOUNTER — Encounter: Payer: Self-pay | Admitting: Gynecology

## 2018-01-09 VITALS — BP 126/76

## 2018-01-09 DIAGNOSIS — N924 Excessive bleeding in the premenopausal period: Secondary | ICD-10-CM

## 2018-01-09 DIAGNOSIS — D259 Leiomyoma of uterus, unspecified: Secondary | ICD-10-CM | POA: Diagnosis not present

## 2018-01-09 NOTE — Progress Notes (Signed)
Donna Matthews 30-Sep-1976 [id number]   Preoperative consult  Chief complaint: Leiomyoma, menorrhagia  History of present illness: 42 y.o. G2P0020 for a history of leiomyoma and menorrhagia.  Menses have progressively worsened to the point of hemoglobin of 5 earlier this year requiring a transfusion.  Exam shows 18 weeks size uterus and ultrasound confirms multiple myomas measuring 71 mm 66 mm 62 mm and 23 mm.  Subsequently had sonohysterogram where large submucous component was noted but otherwise normal.  Endometrial biopsy showed benign endometrial polyp although was not visualized on the sonohysterogram.  Options for management were reviewed with the patient to include hormonal suppression, attempted hysteroscopy with submucous myoma resection, attempted Mirena IUD, uterine artery embolization either open or attempted robotic and hysterectomy.  Patient also has history of breast cancer currently on tamoxifen.  At this point the patient preferred conservative treatment to preserve fertility understanding at the age of 40 the difficulty.  Patient prefers to proceed with open myomectomy.  She is currently on Depo-Lupron for menstrual suppression and hemoglobin recovery.  She also has undergone iron infusion x2.  Her most recent hemoglobin preoperatively is 11.7.  Past medical history,surgical history, medications, allergies, family history and social history were all reviewed and documented in the EPIC chart.  ROS:  Was performed and pertinent positives and negatives are included in the history of present illness.  Exam: [name] assistant Vitals:   01/09/18 1604  BP: 126/76   General: well developed, well nourished female, no acute distress HEENT: normal  Lungs: clear to auscultation without wheezing, rales or rhonchi  Cardiac: regular rate without rubs, murmurs or gallops  Abdomen: soft, nontender with palpable pelvic mass above symphysis consistent with leiomyoma Pelvic: external bus vagina:  normal   Cervix: grossly normal  Uterus: Bulky approximately 16+ weeks size midline mobile nontender.   Adnexa: without masses or tenderness    Assessment/Plan:  42 y.o. G2P0020 with large leiomyoma and progressive menorrhagia requiring transfusion.  Currently on Depo-Lupron with good menstrual suppression and hemoglobin recovery.  Also received iron infusion preoperatively to maximize her hemoglobin.  We reviewed options for management and she elects for open myomectomy.  She understands that if any time during the procedure the situation arises that I may need to proceed with hysterectomy which would lead to absolute irreversible sterility and she understands and accepts this.  She also understands with a multiple myomectomy if she does pursue pregnancy she would be at increased risk for uterine rupture and that more than likely a cesarean section would be recommended for delivery.  Also with scarring due to the myomectomy may lead to infertility such as tubal blockage or cavity distortion.  Patient also understands with a conservative procedure that she is at risk for regrowth of myomas in the future and this may be an issue for her in the future.  A realistic risk of transfusion was also discussed and the risks of transfusion reaction, hepatitis, HIV, mad cow disease and other unknown entities was reviewed.  The expected intraoperative and postoperative courses were reviewed as well as the recovery period.  Risks of infection requiring prolonged antibiotics, abscess or hematoma formation requiring subsequent surgery and drainage, wound complications requiring opening and draining of incisions and closure by secondary intention was all discussed understood and accepted.  I also reviewed wound issues to include scarring, cosmetic such as keloid formation hernia formation and dehiscence with opening of the incision.  Inadvertent injury to internal organs including bowel bladder ureters vessels and nerves  necessitating major exploratory reparative surgeries including bowel resection ureteral/bladder repair and ostomy formation was also discussed.  She understands there are no guarantees as far as menorrhagia relief.  We also discussed that ultrasound the cavity did not show evidence of endometrial polyp but on biopsy it did.  Whether this was a small polyp that was removed with the biopsy or overcall by pathology was reviewed.  She is on tamoxifen and I did recommend to be conservative that we follow-up with ultrasound at 6 months postoperative to relook at the cavity to make sure there is no polyp formations.  The patient's questions were answered to her satisfaction she is ready to proceed with surgery.    Anastasio Auerbach MD, 4:50 PM 01/09/2018

## 2018-01-09 NOTE — H&P (Signed)
Donna Matthews 1976-07-23 [id number]   History and Physical  Chief complaint: Leiomyoma, menorrhagia  History of present illness: 42 y.o. G2P0020 for a history of leiomyoma and menorrhagia.  Menses have progressively worsened to the point of hemoglobin of 5 earlier this year requiring a transfusion.  Exam shows 18 weeks size uterus and ultrasound confirms multiple myomas measuring 71 mm 66 mm 62 mm and 23 mm.  Subsequently had sonohysterogram where large submucous component was noted but otherwise normal.  Endometrial biopsy showed benign endometrial polyp although was not visualized on the sonohysterogram.  Options for management were reviewed with the patient to include hormonal suppression, attempted hysteroscopy with submucous myoma resection, attempted Mirena IUD, uterine artery embolization either open or attempted robotic and hysterectomy.  Patient also has history of breast cancer currently on tamoxifen.  At this point the patient preferred conservative treatment to preserve fertility understanding at the age of 42 the difficulty.  Patient prefers to proceed with open myomectomy.  She is currently on Depo-Lupron for menstrual suppression and hemoglobin recovery.  She also has undergone iron infusion x2.  Her most recent hemoglobin preoperatively is 11.7.  Past Medical History:  Diagnosis Date  . Anemia   . Breast cancer ([place]) 10/2016  . Ectopic pregnancy   . Family history of melanoma   . Family history of stomach cancer   . Fibroids   . GERD (gastroesophageal reflux disease)   . Personal history of radiation therapy   . PONV (postoperative nausea and vomiting)   . STD (sexually transmitted disease)    Chlamydia    Past Surgical History:  Procedure Laterality Date  . BREAST LUMPECTOMY    . BREAST LUMPECTOMY WITH RADIOACTIVE SEED AND SENTINEL LYMPH NODE BIOPSY Left 12/18/2016   Procedure: RADIOACTIVE SEED GUIDED LEFT BREAST LUMPECTOMY WITH LEFT AXILLARY SENTINEL LYMPH NODE BIOPSY;   Surgeon: [name], MD;  Location: [facility];  Service: General;  Laterality: Left;  . DILATION AND CURETTAGE OF UTERUS      Family History  Problem Relation Age of Onset  . Diabetes Mother   . Hypertension Mother   . Heart disease Father   . Heart attack Father   . Diabetes Sister   . Hypertension Sister   . Lung cancer Maternal Aunt   . Melanoma Maternal Uncle   . Lung cancer Maternal Uncle   . Stomach cancer Other        paternal great aunt  . Stomach cancer Cousin        father's maternal cousin  . Cancer Other        2 maternal great aunts with unknown cancer    Social History:  reports that  has never smoked. she has never used smokeless tobacco. She reports that she does not drink alcohol or use drugs.  Allergies:  Allergies  Allergen Reactions  . Bee Venom Hives and Swelling  . Penicillins Swelling    Has patient had a PCN reaction causing immediate rash, facial/tongue/throat swelling, SOB or lightheadedness with hypotension:unknown Has patient had a PCN reaction causing severe rash involving mucus membranes or skin necrosis: No Has patient had a PCN reaction that required hospitalization No Has patient had a PCN reaction occurring within the last 10 years: No If all of the above answers are "NO", then may proceed with Cephalosporin use.     Medications: See epic for latest listing  ROS:  Was performed and pertinent positives and negatives are included in the history of present illness.  Exam: [name] assistant Vitals:   01/09/18 1604  BP: 126/76   General: well developed, well nourished female, no acute distress HEENT: normal  Lungs: clear to auscultation without wheezing, rales or rhonchi  Cardiac: regular rate without rubs, murmurs or gallops  Abdomen: soft, nontender with palpable pelvic mass above symphysis consistent with leiomyoma Pelvic: external bus vagina: normal   Cervix: grossly normal  Uterus: Bulky approximately 16+ weeks size midline  mobile nontender.   Adnexa: without masses or tenderness   Assessment/Plan:  42 y.o. G2P0020 with large leiomyoma and progressive menorrhagia requiring transfusion.  Currently on Depo-Lupron with good menstrual suppression and hemoglobin recovery.  Also received iron infusion preoperatively to maximize her hemoglobin.  We reviewed options for management and she elects for open myomectomy.  She understands that if any time during the procedure the situation arises that I may need to proceed with hysterectomy which would lead to absolute irreversible sterility and she understands and accepts this.  She also understands with a multiple myomectomy if she does pursue pregnancy she would be at increased risk for uterine rupture and that more than likely a cesarean section would be recommended for delivery.  Also with scarring due to the myomectomy may lead to infertility such as tubal blockage or cavity distortion.  Patient also understands with a conservative procedure that she is at risk for regrowth of myomas in the future and this may be an issue for her in the future.  A realistic risk of transfusion was also discussed and the risks of transfusion reaction, hepatitis, HIV, mad cow disease and other unknown entities was reviewed.  The expected intraoperative and postoperative courses were reviewed as well as the recovery period.  Risks of infection requiring prolonged antibiotics, abscess or hematoma formation requiring subsequent surgery and drainage, wound complications requiring opening and draining of incisions and closure by secondary intention was all discussed understood and accepted.  I also reviewed wound issues to include scarring, cosmetic such as keloid formation hernia formation and dehiscence with opening of the incision.  Inadvertent injury to internal organs including bowel bladder ureters vessels and nerves necessitating major exploratory reparative surgeries including bowel resection  ureteral/bladder repair and ostomy formation was also discussed.  She understands there are no guarantees as far as menorrhagia relief.  We also discussed that ultrasound the cavity did not show evidence of endometrial polyp but on biopsy it did.  Whether this was a small polyp that was removed with the biopsy or overcall by pathology was reviewed.  She is on tamoxifen and I did recommend to be conservative that we follow-up with ultrasound at 6 months postoperative to relook at the cavity to make sure there is no polyp formations.  The patient's questions were answered to her satisfaction she is ready to proceed with surgery.    Anastasio Auerbach MD, 5:02 PM 01/09/2018

## 2018-01-09 NOTE — Assessment & Plan Note (Signed)
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 Intermittent hot flashes 2. occasional muscle cramps 3. Heavy menstrual bleeding: Patient has fibroids. She is getting evaluated by GYN. She was admitted to the hospital in May with a hemoglobin of 5.4 and was given IV iron infusions as well as blood transfusion. She is currently on oral iron 3 times a day and last hemoglobin apparently was 11.2.  Return to clinic in 1 year for follow-up her mammograms have been set up for November 2018.

## 2018-01-09 NOTE — Patient Instructions (Signed)
Followup for surgery as scheduled. 

## 2018-01-10 ENCOUNTER — Telehealth: Payer: Self-pay | Admitting: Hematology and Oncology

## 2018-01-10 ENCOUNTER — Inpatient Hospital Stay: Payer: 59 | Attending: Hematology and Oncology | Admitting: Hematology and Oncology

## 2018-01-10 DIAGNOSIS — C50212 Malignant neoplasm of upper-inner quadrant of left female breast: Secondary | ICD-10-CM

## 2018-01-10 DIAGNOSIS — D0512 Intraductal carcinoma in situ of left breast: Secondary | ICD-10-CM | POA: Diagnosis not present

## 2018-01-10 DIAGNOSIS — Z923 Personal history of irradiation: Secondary | ICD-10-CM | POA: Diagnosis not present

## 2018-01-10 DIAGNOSIS — Z79811 Long term (current) use of aromatase inhibitors: Secondary | ICD-10-CM

## 2018-01-10 DIAGNOSIS — Z170 Estrogen receptor positive status [ER+]: Secondary | ICD-10-CM | POA: Diagnosis not present

## 2018-01-10 MED ORDER — TAMOXIFEN CITRATE 20 MG PO TABS: 20.0000 mg | ORAL_TABLET | Freq: Every day | ORAL | 3 refills | Status: DC

## 2018-01-10 NOTE — Telephone Encounter (Signed)
Gave patient avs and calendar with appts per 2/1 los.  °

## 2018-01-10 NOTE — Progress Notes (Signed)
Patient Care Team: [name], PA as PCP - General (Family Medicine) [name], MD as Consulting Physician (General Surgery) Nicholas Lose, MD as Consulting Physician (Hematology and Oncology) Kyung Rudd, MD as Consulting Physician (Radiation Oncology) Delice Bison Charlestine Massed, NP as Nurse Practitioner (Hematology and Oncology)  DIAGNOSIS:  Encounter Diagnosis  Name Primary?  . 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  The Hereditary Gene Panel offered by [facility] includes sequencing and/or deletion duplication testing of the following 43 genes: APC, ATM, AXIN2, BARD1, BMPR1A, BRCA1, BRCA2, BRIP1, CDH1, CDKN2A (p14ARF), CDKN2A (p16INK4a), CHEK2, DICER1, EPCAM (Deletion/duplication testing only), GREM1 (promoter region deletion/duplication testing only), KIT, MEN1, MLH1, MSH2, MSH6, MUTYH, NBN, NF1, PALB2, PDGFRA, PMS2, POLD1, POLE, PTEN, RAD50, RAD51C, RAD51D, SDHB, SDHC, SDHD, SMAD4, SMARCA4. STK11, TP53, TSC1, TSC2, and VHL.  The following gene was evaluated for sequence changes only: SDHA and HOXB13 c.251G>A.  The report date is December 02, 2016.      12/18/2016 Surgery    Left Lumpectomy: HG DCIS 1.2 cm, Margins Neg, 0/7 LN Neg; TisN0 (Stage 0)      01/21/2017 - 03/06/2017 Radiation Therapy    Adjuvant radiation therapy [facility]): 1) Left Breast: 50.4 Gy in 28 fractions.  2) Left Breast Boost: 10 Gy in 5 fractions.      04/09/2017 -  Anti-estrogen oral therapy    Tamoxifen 20  mg daily       CHIEF COMPLIANT: Follow-up on tamoxifen therapy  INTERVAL HISTORY: Donna Matthews is a 42 year old with above-mentioned history of left breast DCIS who underwent lumpectomy radiation is currently on tamoxifen.  She has had heavy uterine bleeding and is scheduled to undergo hysterectomy next Tuesday.  She has tolerated tamoxifen reasonably well.  She had moderate degree of hot flashes.  She also has intermittent bilateral breast discomfort.  Her mammograms done in November were normal.  REVIEW OF SYSTEMS:   Constitutional: Denies fevers, chills or abnormal weight loss Eyes: Denies blurriness of vision Ears, nose, mouth, throat, and face: Denies mucositis or sore throat Respiratory: Denies cough, dyspnea or wheezes Cardiovascular: Denies palpitation, chest discomfort Gastrointestinal:  Denies nausea, heartburn or change in bowel habits Skin: Denies abnormal skin rashes Lymphatics: Denies new lymphadenopathy or easy bruising Neurological:Denies numbness, tingling or new weaknesses Behavioral/Psych: Mood is stable, no new changes  Extremities: No lower extremity edema Breast: Intermittent bilateral breast pain All other systems were reviewed with the patient and are negative.  I have reviewed the past medical history, past surgical history, social history and family history with the patient and they are unchanged from previous note.  ALLERGIES:  is allergic to bee venom and penicillins.  MEDICATIONS:  Current Outpatient Medications  Medication Sig Dispense Refill  . acetaminophen (TYLENOL) 500 MG tablet Take 500-1,000 mg by mouth every 6 (six) hours as needed (FOR PAIN).    Marland Kitchen Biotin 1 MG CAPS Take 1 mg by mouth daily.    . Cholecalciferol (VITAMIN D3) 5000 units CAPS Take 5,000 Units by mouth  daily.    . Ferrous Sulfate (IRON) 325 (65 Fe) MG TABS Take 1 tablet by mouth 3 (three) times daily. 30 each 0  . ibuprofen (ADVIL,MOTRIN) 200 MG tablet Take 200-400 mg by mouth every  8 (eight) hours as needed (FOR PAIN/CRAMPING.).    Marland Kitchen megestrol (MEGACE) 40 MG tablet Take 1 tablet (40 mg total) by mouth 2 (two) times daily. (Patient taking differently: Take 40 mg by mouth See admin instructions. TAKE 1 TABLET (40 MG) BY MOUTH TWICE DAILY WITH MENSTRUATION) 30 tablet 0  . Multiple Vitamins-Minerals (ADULT GUMMY PO) Take 2 tablets by mouth daily.    Marland Kitchen nystatin cream (MYCOSTATIN) Apply 1 application topically 2 (two) times daily as needed (for dry/irritated skin.).     Marland Kitchen tamoxifen (NOLVADEX) 20 MG tablet Take 20 mg by mouth daily at 2 PM.   3   No current facility-administered medications for this visit.     PHYSICAL EXAMINATION: ECOG PERFORMANCE STATUS: 1 - Symptomatic but completely ambulatory  Vitals:   01/10/18 0815  BP: 125/76  Pulse: 73  Resp: 18  Temp: 99.2 F (37.3 C)  SpO2: 100%   Filed Weights   01/10/18 0815  Weight: 185 lb 9.6 oz (84.2 kg)    GENERAL:alert, no distress and comfortable SKIN: skin color, texture, turgor are normal, no rashes or significant lesions EYES: normal, Conjunctiva are pink and non-injected, sclera clear OROPHARYNX:no exudate, no erythema and lips, buccal mucosa, and tongue normal  NECK: supple, thyroid normal size, non-tender, without nodularity LYMPH:  no palpable lymphadenopathy in the cervical, axillary or inguinal LUNGS: clear to auscultation and percussion with normal breathing effort HEART: regular rate & rhythm and no murmurs and no lower extremity edema ABDOMEN:abdomen soft, non-tender and normal bowel sounds MUSCULOSKELETAL:no cyanosis of digits and no clubbing  NEURO: alert & oriented x 3 with fluent speech, no focal motor/sensory deficits EXTREMITIES: No lower extremity edema BREAST: No palpable masses or nodules in either right or left breasts. No palpable axillary supraclavicular or infraclavicular adenopathy no breast tenderness or nipple discharge. (exam performed in the presence of a chaperone)  LABORATORY  DATA:  I have reviewed the data as listed CMP Latest Ref Rng & Units 11/26/2017 11/22/2017 11/21/2017  Glucose 65 - 99 mg/dL 93 113(H) 117(H)  BUN 7 - 25 mg/dL 6(L) 10 13  Creatinine 0.50 - 1.10 mg/dL 0.59 0.51 0.49  Sodium 135 - 146 mmol/L 141 139 141  Potassium 3.5 - 5.3 mmol/L 4.1 3.3(L) 3.9  Chloride 98 - 110 mmol/L 109 109 112(H)  CO2 20 - 32 mmol/L _0 Calcium 8.6 - 10.2 mg/dL 8.7 7.4(L) 8.3(L)  Total Protein 6.1 - 8.1 g/dL 5.9(L) - 6.0(L)  Total Bilirubin 0.2 - 1.2 mg/dL 0.3 - 0.6  Alkaline Phos 38 - 126 U/L - - 37(L)  AST 10 - 30 U/L 13 - 17  ALT 6 - 29 U/L 12 - 14    Lab Results  Component Value Date   WBC 6.0 01/08/2018   HGB 11.7 (L) 01/08/2018   HCT 33.6 (L) 01/08/2018   MCV 79.8 01/08/2018   PLT 219 01/08/2018   NEUTROABS 4,410 11/26/2017    ASSESSMENT & PLAN:  Malignant neoplasm of upper-inner quadrant of left female breast ([place]) 12/18/16: Left Lumpectomy: HG DCIS 1.2 cm, Margins Neg, 0/7 LN Neg; TisN0 (Stage 1 A) ER 95%, PR 95% Adjuvant radiation therapy 01/21/2017- 03/06/2017  Current treatment: Antiestrogen therapy with tamoxifen 5 years, started 03/10/2017  Tamoxifen Toxicities: 1. Intermittent  hot flashes 2. occasional muscle cramps 3. Heavy menstrual bleeding: Patient has fibroids.  Patient is scheduled to undergo hysterectomy next Tuesday.  Survivorship: Discussed the importance of physical exercise in decreasing the likelihood of breast cancer recurrence. Recommended 30 mins daily 6 days a week of either brisk walking or cycling or swimming. Encouraged patient to eat more fruits and vegetables and decrease red meat.   Breast Cancer Surveillance: 1. Breast exam 01/10/2018: Normal 2. Mammogram November 2018: Benign  Return to clinic in 1 year for follow-up   I spent 25 minutes talking to the patient of which more than half was spent in counseling and coordination of care.  No orders of the defined types were placed in this encounter.  The  patient has a good understanding of the overall plan. she agrees with it. she will call with any problems that may develop before the next visit here.   Harriette Ohara, MD 01/10/18

## 2018-01-14 ENCOUNTER — Other Ambulatory Visit: Payer: Self-pay

## 2018-01-14 ENCOUNTER — Inpatient Hospital Stay (HOSPITAL_COMMUNITY): Payer: 59 | Admitting: Anesthesiology

## 2018-01-14 ENCOUNTER — Encounter (HOSPITAL_COMMUNITY)
Admission: RE | Disposition: A | Discharge status: Home / Self Care | Payer: Self-pay | Source: Ambulatory Visit | Attending: Gynecology

## 2018-01-14 ENCOUNTER — Encounter (HOSPITAL_COMMUNITY): Payer: Self-pay | Admitting: Anesthesiology

## 2018-01-14 ENCOUNTER — Ambulatory Visit (HOSPITAL_COMMUNITY)
Admission: RE | Admit: 2018-01-14 | Discharge: 2018-01-16 | Disposition: A | Discharge status: Home / Self Care | Payer: 59 | Source: Ambulatory Visit | Attending: Gynecology | Admitting: Gynecology

## 2018-01-14 DIAGNOSIS — N840 Polyp of corpus uteri: Secondary | ICD-10-CM | POA: Diagnosis not present

## 2018-01-14 DIAGNOSIS — Z9889 Other specified postprocedural states: Secondary | ICD-10-CM | POA: Diagnosis not present

## 2018-01-14 DIAGNOSIS — Z809 Family history of malignant neoplasm, unspecified: Secondary | ICD-10-CM | POA: Insufficient documentation

## 2018-01-14 DIAGNOSIS — Z801 Family history of malignant neoplasm of trachea, bronchus and lung: Secondary | ICD-10-CM | POA: Diagnosis not present

## 2018-01-14 DIAGNOSIS — Z79818 Long term (current) use of other agents affecting estrogen receptors and estrogen levels: Secondary | ICD-10-CM | POA: Diagnosis not present

## 2018-01-14 DIAGNOSIS — D219 Benign neoplasm of connective and other soft tissue, unspecified: Principal | ICD-10-CM | POA: Diagnosis present

## 2018-01-14 DIAGNOSIS — Z800 Family history of malignant neoplasm of digestive organs: Secondary | ICD-10-CM | POA: Insufficient documentation

## 2018-01-14 DIAGNOSIS — Z79810 Long term (current) use of selective estrogen receptor modulators (SERMs): Secondary | ICD-10-CM | POA: Insufficient documentation

## 2018-01-14 DIAGNOSIS — Z8249 Family history of ischemic heart disease and other diseases of the circulatory system: Secondary | ICD-10-CM | POA: Insufficient documentation

## 2018-01-14 DIAGNOSIS — Z880 Allergy status to penicillin: Secondary | ICD-10-CM | POA: Insufficient documentation

## 2018-01-14 DIAGNOSIS — Z91030 Bee allergy status: Secondary | ICD-10-CM | POA: Diagnosis not present

## 2018-01-14 DIAGNOSIS — D259 Leiomyoma of uterus, unspecified: Secondary | ICD-10-CM | POA: Diagnosis not present

## 2018-01-14 DIAGNOSIS — Z923 Personal history of irradiation: Secondary | ICD-10-CM | POA: Diagnosis not present

## 2018-01-14 DIAGNOSIS — N99820 Postprocedural hemorrhage and hematoma of a genitourinary system organ or structure following a genitourinary system procedure: Secondary | ICD-10-CM | POA: Insufficient documentation

## 2018-01-14 DIAGNOSIS — N924 Excessive bleeding in the premenopausal period: Secondary | ICD-10-CM | POA: Diagnosis not present

## 2018-01-14 DIAGNOSIS — Z79899 Other long term (current) drug therapy: Secondary | ICD-10-CM | POA: Diagnosis not present

## 2018-01-14 DIAGNOSIS — Z8619 Personal history of other infectious and parasitic diseases: Secondary | ICD-10-CM | POA: Diagnosis not present

## 2018-01-14 DIAGNOSIS — N920 Excessive and frequent menstruation with regular cycle: Secondary | ICD-10-CM | POA: Diagnosis present

## 2018-01-14 DIAGNOSIS — Z833 Family history of diabetes mellitus: Secondary | ICD-10-CM | POA: Diagnosis not present

## 2018-01-14 DIAGNOSIS — D649 Anemia, unspecified: Secondary | ICD-10-CM | POA: Insufficient documentation

## 2018-01-14 DIAGNOSIS — Z853 Personal history of malignant neoplasm of breast: Secondary | ICD-10-CM | POA: Insufficient documentation

## 2018-01-14 HISTORY — PX: POLYPECTOMY: SHX5525

## 2018-01-14 HISTORY — PX: MYOMECTOMY: SHX85

## 2018-01-14 LAB — CBC
HEMATOCRIT: 33.3 % — AB (ref 36.0–46.0)
Hemoglobin: 11.7 g/dL — ABNORMAL LOW (ref 12.0–15.0)
MCH: 27.9 pg (ref 26.0–34.0)
MCHC: 35.1 g/dL (ref 30.0–36.0)
MCV: 79.5 fL (ref 78.0–100.0)
Platelets: 122 10*3/uL — ABNORMAL LOW (ref 150–400)
RBC: 4.19 MIL/uL (ref 3.87–5.11)
RDW: 15.5 % (ref 11.5–15.5)
WBC: 13.5 10*3/uL — ABNORMAL HIGH (ref 4.0–10.5)

## 2018-01-14 LAB — PREGNANCY, URINE: PREG TEST UR: NEGATIVE

## 2018-01-14 LAB — PREPARE RBC (CROSSMATCH)

## 2018-01-14 SURGERY — MYOMECTOMY, ABDOMINAL APPROACH
Anesthesia: General | Site: Uterus

## 2018-01-14 MED ORDER — 0.9 % SODIUM CHLORIDE (POUR BTL) OPTIME
TOPICAL | Status: DC | PRN
  Administered 2018-01-14: 1000 mL

## 2018-01-14 MED ORDER — ACETAMINOPHEN 500 MG PO TABS
ORAL_TABLET | ORAL | Status: AC
  Filled 2018-01-14: qty 2

## 2018-01-14 MED ORDER — LIDOCAINE HCL (CARDIAC) 20 MG/ML IV SOLN
INTRAVENOUS | Status: AC
  Filled 2018-01-14: qty 5

## 2018-01-14 MED ORDER — VASOPRESSIN 20 UNIT/ML IV SOLN
INTRAVENOUS | Status: AC
  Filled 2018-01-14: qty 1

## 2018-01-14 MED ORDER — DEXAMETHASONE SODIUM PHOSPHATE 10 MG/ML IJ SOLN
INTRAMUSCULAR | Status: DC | PRN
  Administered 2018-01-14 (×2): 4 mg via INTRAVENOUS

## 2018-01-14 MED ORDER — BUPIVACAINE LIPOSOME 1.3 % IJ SUSP
INTRAMUSCULAR | Status: DC | PRN
  Administered 2018-01-14: 20 mL

## 2018-01-14 MED ORDER — MIDAZOLAM HCL 2 MG/2ML IJ SOLN
INTRAMUSCULAR | Status: DC | PRN
  Administered 2018-01-14: 2 mg via INTRAVENOUS

## 2018-01-14 MED ORDER — ONDANSETRON HCL 4 MG/2ML IJ SOLN
INTRAMUSCULAR | Status: AC
  Filled 2018-01-14: qty 2

## 2018-01-14 MED ORDER — SUGAMMADEX SODIUM 200 MG/2ML IV SOLN
INTRAVENOUS | Status: AC
  Filled 2018-01-14: qty 2

## 2018-01-14 MED ORDER — NALOXONE HCL 0.4 MG/ML IJ SOLN: 0.4000 mg | INTRAMUSCULAR | Status: DC | PRN

## 2018-01-14 MED ORDER — METOCLOPRAMIDE HCL 5 MG/ML IJ SOLN
INTRAMUSCULAR | Status: AC
  Filled 2018-01-14: qty 2

## 2018-01-14 MED ORDER — SCOPOLAMINE 1 MG/3DAYS TD PT72
1.0000 | MEDICATED_PATCH | Freq: Once | TRANSDERMAL | Status: DC
  Administered 2018-01-14: 1.5 mg via TRANSDERMAL

## 2018-01-14 MED ORDER — FENTANYL CITRATE (PF) 100 MCG/2ML IJ SOLN
INTRAMUSCULAR | Status: DC | PRN
  Administered 2018-01-14: 50 ug via INTRAVENOUS
  Administered 2018-01-14: 100 ug via INTRAVENOUS
  Administered 2018-01-14 (×2): 50 ug via INTRAVENOUS
  Administered 2018-01-14: 100 ug via INTRAVENOUS

## 2018-01-14 MED ORDER — FENTANYL CITRATE (PF) 250 MCG/5ML IJ SOLN
INTRAMUSCULAR | Status: AC
  Filled 2018-01-14: qty 5

## 2018-01-14 MED ORDER — DIPHENHYDRAMINE HCL 50 MG/ML IJ SOLN
12.5000 mg | Freq: Four times a day (QID) | INTRAMUSCULAR | Status: DC | PRN
  Administered 2018-01-15: 12.5 mg via INTRAVENOUS
  Filled 2018-01-14: qty 1

## 2018-01-14 MED ORDER — PROPOFOL 10 MG/ML IV BOLUS
INTRAVENOUS | Status: DC | PRN
  Administered 2018-01-14: 200 mg via INTRAVENOUS

## 2018-01-14 MED ORDER — SCOPOLAMINE 1 MG/3DAYS TD PT72
MEDICATED_PATCH | TRANSDERMAL | Status: AC
  Administered 2018-01-14: 1.5 mg via TRANSDERMAL
  Filled 2018-01-14: qty 1

## 2018-01-14 MED ORDER — GLYCOPYRROLATE 0.2 MG/ML IJ SOLN
INTRAMUSCULAR | Status: DC | PRN
  Administered 2018-01-14: 0.1 mg via INTRAVENOUS

## 2018-01-14 MED ORDER — MIDAZOLAM HCL 2 MG/2ML IJ SOLN
INTRAMUSCULAR | Status: AC
  Filled 2018-01-14: qty 2

## 2018-01-14 MED ORDER — PROPOFOL 10 MG/ML IV BOLUS
INTRAVENOUS | Status: AC
  Filled 2018-01-14: qty 20

## 2018-01-14 MED ORDER — LACTATED RINGERS IV SOLN: INTRAVENOUS | Status: DC

## 2018-01-14 MED ORDER — DEXAMETHASONE SODIUM PHOSPHATE 10 MG/ML IJ SOLN
INTRAMUSCULAR | Status: AC
  Filled 2018-01-14: qty 1

## 2018-01-14 MED ORDER — KETOROLAC TROMETHAMINE 30 MG/ML IJ SOLN: 30.0000 mg | Freq: Once | INTRAMUSCULAR | Status: DC

## 2018-01-14 MED ORDER — DIPHENHYDRAMINE HCL 25 MG PO CAPS
25.0000 mg | ORAL_CAPSULE | ORAL | Status: DC | PRN
  Administered 2018-01-15: 25 mg via ORAL
  Filled 2018-01-14: qty 1

## 2018-01-14 MED ORDER — ROCURONIUM BROMIDE 100 MG/10ML IV SOLN
INTRAVENOUS | Status: AC
  Filled 2018-01-14: qty 1

## 2018-01-14 MED ORDER — LIDOCAINE HCL (CARDIAC) 20 MG/ML IV SOLN
INTRAVENOUS | Status: DC | PRN
  Administered 2018-01-14: 100 mg via INTRAVENOUS

## 2018-01-14 MED ORDER — VASOPRESSIN 20 UNIT/ML IV SOLN
INTRAVENOUS | Status: DC | PRN
  Administered 2018-01-14: 30 mL via INTRAMUSCULAR

## 2018-01-14 MED ORDER — FENTANYL CITRATE (PF) 100 MCG/2ML IJ SOLN
INTRAMUSCULAR | Status: AC
  Filled 2018-01-14: qty 2

## 2018-01-14 MED ORDER — ONDANSETRON HCL 4 MG/2ML IJ SOLN
INTRAMUSCULAR | Status: DC | PRN
  Administered 2018-01-14: 4 mg via INTRAVENOUS

## 2018-01-14 MED ORDER — SODIUM CHLORIDE 0.9% FLUSH: 9.0000 mL | INTRAVENOUS | Status: DC | PRN

## 2018-01-14 MED ORDER — HYDROMORPHONE HCL 1 MG/ML IJ SOLN
INTRAMUSCULAR | Status: AC
  Administered 2018-01-14: 0.5 mg via INTRAVENOUS
  Filled 2018-01-14: qty 0.5

## 2018-01-14 MED ORDER — ESMOLOL HCL 100 MG/10ML IV SOLN
INTRAVENOUS | Status: DC | PRN
  Administered 2018-01-14: 10 mg via INTRAVENOUS

## 2018-01-14 MED ORDER — HYDROMORPHONE HCL 1 MG/ML IJ SOLN
INTRAMUSCULAR | Status: AC
  Filled 2018-01-14: qty 1

## 2018-01-14 MED ORDER — ONDANSETRON HCL 4 MG PO TABS: 4.0000 mg | ORAL_TABLET | Freq: Four times a day (QID) | ORAL | Status: DC | PRN

## 2018-01-14 MED ORDER — ROCURONIUM BROMIDE 100 MG/10ML IV SOLN
INTRAVENOUS | Status: DC | PRN
  Administered 2018-01-14: 50 mg via INTRAVENOUS

## 2018-01-14 MED ORDER — DEXTROSE-NACL 5-0.9 % IV SOLN
INTRAVENOUS | Status: DC
  Administered 2018-01-14 – 2018-01-15 (×2): via INTRAVENOUS

## 2018-01-14 MED ORDER — HYDROMORPHONE HCL 1 MG/ML IJ SOLN
0.2500 mg | INTRAMUSCULAR | Status: DC | PRN
  Administered 2018-01-14: 0.25 mg via INTRAVENOUS
  Administered 2018-01-14: 0.5 mg via INTRAVENOUS

## 2018-01-14 MED ORDER — METHYLERGONOVINE MALEATE 0.2 MG/ML IJ SOLN
0.2000 mg | Freq: Once | INTRAMUSCULAR | Status: AC
  Administered 2018-01-14: 0.2 mg via INTRAMUSCULAR
  Filled 2018-01-14: qty 1

## 2018-01-14 MED ORDER — ONDANSETRON HCL 4 MG/2ML IJ SOLN
4.0000 mg | Freq: Four times a day (QID) | INTRAMUSCULAR | Status: DC | PRN
Start: 2018-01-14 — End: 2018-01-15
  Filled 2018-01-14: qty 2

## 2018-01-14 MED ORDER — METOCLOPRAMIDE HCL 5 MG/ML IJ SOLN
INTRAMUSCULAR | Status: DC | PRN
  Administered 2018-01-14: 10 mg via INTRAVENOUS

## 2018-01-14 MED ORDER — ACETAMINOPHEN 160 MG/5ML PO SOLN: 960.0000 mg | Freq: Once | ORAL | Status: AC

## 2018-01-14 MED ORDER — CELECOXIB 200 MG PO CAPS
200.0000 mg | ORAL_CAPSULE | Freq: Once | ORAL | Status: AC | PRN
  Administered 2018-01-14: 200 mg via ORAL

## 2018-01-14 MED ORDER — ACETAMINOPHEN 500 MG PO TABS
1000.0000 mg | ORAL_TABLET | Freq: Once | ORAL | Status: AC
  Administered 2018-01-14: 1000 mg via ORAL

## 2018-01-14 MED ORDER — CELECOXIB 200 MG PO CAPS
ORAL_CAPSULE | ORAL | Status: AC
  Filled 2018-01-14: qty 1

## 2018-01-14 MED ORDER — BUPIVACAINE LIPOSOME 1.3 % IJ SUSP
20.0000 mL | Freq: Once | INTRAMUSCULAR | Status: DC
  Filled 2018-01-14: qty 20

## 2018-01-14 MED ORDER — MORPHINE SULFATE 2 MG/ML IV SOLN
INTRAVENOUS | Status: DC
  Administered 2018-01-14: 7.5 mg via INTRAVENOUS
  Administered 2018-01-14: 14:00:00 via INTRAVENOUS
  Filled 2018-01-14: qty 30

## 2018-01-14 MED ORDER — HYDROMORPHONE HCL 1 MG/ML IJ SOLN
INTRAMUSCULAR | Status: DC | PRN
  Administered 2018-01-14: 1 mg via INTRAVENOUS

## 2018-01-14 MED ORDER — DIPHENHYDRAMINE HCL 12.5 MG/5ML PO ELIX: 12.5000 mg | ORAL_SOLUTION | Freq: Four times a day (QID) | ORAL | Status: DC | PRN

## 2018-01-14 MED ORDER — TRANEXAMIC ACID 1000 MG/10ML IV SOLN
1000.0000 mg | Freq: Once | INTRAVENOUS | Status: AC
  Administered 2018-01-14: 1000 mg via INTRAVENOUS
  Filled 2018-01-14: qty 10

## 2018-01-14 MED ORDER — SUGAMMADEX SODIUM 200 MG/2ML IV SOLN
INTRAVENOUS | Status: DC | PRN
  Administered 2018-01-14: 170 mg via INTRAVENOUS

## 2018-01-14 MED ORDER — ONDANSETRON HCL 4 MG/2ML IJ SOLN
4.0000 mg | Freq: Four times a day (QID) | INTRAMUSCULAR | Status: DC | PRN
  Administered 2018-01-14: 4 mg via INTRAVENOUS

## 2018-01-14 MED ORDER — SODIUM CHLORIDE 0.9 % IJ SOLN
INTRAMUSCULAR | Status: AC
  Filled 2018-01-14: qty 100

## 2018-01-14 MED ORDER — PROMETHAZINE HCL 25 MG/ML IJ SOLN: 6.2500 mg | INTRAMUSCULAR | Status: DC | PRN

## 2018-01-14 MED ORDER — GENTAMICIN SULFATE 40 MG/ML IJ SOLN
INTRAVENOUS | Status: AC
  Administered 2018-01-14: 100 mL via INTRAVENOUS
  Filled 2018-01-14: qty 8.25

## 2018-01-14 MED ORDER — LACTATED RINGERS IV SOLN
INTRAVENOUS | Status: DC
  Administered 2018-01-14 (×2): via INTRAVENOUS
  Administered 2018-01-14: 125 mL/h via INTRAVENOUS

## 2018-01-14 MED ORDER — ESMOLOL HCL 100 MG/10ML IV SOLN
INTRAVENOUS | Status: AC
  Filled 2018-01-14: qty 10

## 2018-01-14 SURGICAL SUPPLY — 48 items
APL SKNCLS STERI-STRIP NONHPOA (GAUZE/BANDAGES/DRESSINGS) ×2
BARRIER ADHS 3X4 INTERCEED (GAUZE/BANDAGES/DRESSINGS) IMPLANT
BENZOIN TINCTURE PRP APPL 2/3 (GAUZE/BANDAGES/DRESSINGS) ×1 IMPLANT
BRR ADH 4X3 ABS CNTRL BYND (GAUZE/BANDAGES/DRESSINGS)
CANISTER SUCT 3000ML PPV (MISCELLANEOUS) ×3 IMPLANT
CATH FOLEY 2WAY  3CC  8FR (CATHETERS)
CATH FOLEY 2WAY 3CC 8FR (CATHETERS) IMPLANT
CLOTH BEACON ORANGE TIMEOUT ST (SAFETY) ×3 IMPLANT
CONT PATH 16OZ SNAP LID 3702 (MISCELLANEOUS) ×3 IMPLANT
DECANTER SPIKE VIAL GLASS SM (MISCELLANEOUS) ×4 IMPLANT
DRAPE CESAREAN BIRTH W POUCH (DRAPES) ×3 IMPLANT
DRSG OPSITE POSTOP 4X10 (GAUZE/BANDAGES/DRESSINGS) ×1 IMPLANT
FILTER STRAW FLUID ASPIR (MISCELLANEOUS) IMPLANT
GAUZE SPONGE 4X4 16PLY XRAY LF (GAUZE/BANDAGES/DRESSINGS) ×5 IMPLANT
GLOVE BIO SURGEON STRL SZ7.5 (GLOVE) ×3 IMPLANT
GLOVE BIOGEL PI IND STRL 7.0 (GLOVE) ×4 IMPLANT
GLOVE BIOGEL PI IND STRL 8 (GLOVE) ×2 IMPLANT
GLOVE BIOGEL PI INDICATOR 7.0 (GLOVE) ×2
GLOVE BIOGEL PI INDICATOR 8 (GLOVE)
GLOVE ECLIPSE 7.5 STRL STRAW (GLOVE) ×2 IMPLANT
GOWN STRL REUS W/TWL LRG LVL3 (GOWN DISPOSABLE) ×9 IMPLANT
HYDROGEN PEROXIDE 16OZ (MISCELLANEOUS) ×2 IMPLANT
IV STOPCOCK 4 WAY 40  W/Y SET (IV SOLUTION)
IV STOPCOCK 4 WAY 40 W/Y SET (IV SOLUTION) IMPLANT
NDL HYPO 18GX1.5 BLUNT FILL (NEEDLE) IMPLANT
NEEDLE HYPO 18GX1.5 BLUNT FILL (NEEDLE) ×3 IMPLANT
NEEDLE HYPO 22GX1.5 SAFETY (NEEDLE) ×6 IMPLANT
NS IRRIG 1000ML POUR BTL (IV SOLUTION) ×3 IMPLANT
PACK ABDOMINAL GYN (CUSTOM PROCEDURE TRAY) ×3 IMPLANT
PAD OB MATERNITY 4.3X12.25 (PERSONAL CARE ITEMS) ×3 IMPLANT
PROTECTOR NERVE ULNAR (MISCELLANEOUS) ×3 IMPLANT
SPONGE GAUZE 4X4 12PLY STER LF (GAUZE/BANDAGES/DRESSINGS) IMPLANT
STRIP CLOSURE SKIN 1/2X4 (GAUZE/BANDAGES/DRESSINGS) ×1 IMPLANT
SUT PDS AB 0 CT1 27 (SUTURE) IMPLANT
SUT PDS AB 2-0 CT1 27 (SUTURE) IMPLANT
SUT PDS AB 3-0 SH 27 (SUTURE) ×6 IMPLANT
SUT VIC AB 0 CT1 18XCR BRD8 (SUTURE) ×2 IMPLANT
SUT VIC AB 0 CT1 27 (SUTURE) ×9
SUT VIC AB 0 CT1 27XBRD ANBCTR (SUTURE) ×6 IMPLANT
SUT VIC AB 0 CT1 8-18 (SUTURE) ×3
SUT VIC AB 4-0 KS 27 (SUTURE) ×3 IMPLANT
SYR 20CC LL (SYRINGE) IMPLANT
SYR 3ML 22GX1 1/2 SAFETY (SYRINGE) IMPLANT
SYR 50ML LL SCALE MARK (SYRINGE) ×1 IMPLANT
SYR 5ML LL (SYRINGE) ×2 IMPLANT
SYR CONTROL 10ML LL (SYRINGE) ×6 IMPLANT
TOWEL OR 17X24 6PK STRL BLUE (TOWEL DISPOSABLE) ×6 IMPLANT
TRAY FOLEY CATH SILVER 14FR (SET/KITS/TRAYS/PACK) ×3 IMPLANT

## 2018-01-14 NOTE — Progress Notes (Signed)
Dr Phineas Real here to check bleeding.

## 2018-01-14 NOTE — Op Note (Signed)
SHALENE GALLEN February 17, 1976 [id number]   Post Operative Note   Date of surgery:  01/14/2018  Pre Op Dx: Menorrhagia, leiomyoma, endometrial polyp  Post Op Dx: Menorrhagia, leiomyoma endometrial polyp  Procedure: Abdominal myomectomy, endometrial polypectomy  Surgeon:  Anastasio Auerbach  Assistant:  Princess Bruins  Anesthesia:  General  EBL: 50 cc anesthesia reported  Complications:  None  Specimen: Leiomyoma x6, endometrial polyp to pathology  Findings: EUA: Palpable pelvic mass approximately 16-18 weeks size   Operative: Multiple myomas noting large anterior intramural and large posterior intramural myoma with 4 other smaller intramural myomas.  Endometrial cavity entered with endometrial polypoid growth noted and excised.  Right and left ovaries grossly normal.  Right fallopian tube normal length, caliber and fimbriated end.  Left fallopian tube with missing mid tubal segment consistent with history of ectopic pregnancy with normal appearing distal portion.  No evidence of pelvic endometriosis or adhesions.  Upper abdominal exam palpably normal.  Procedure: The patient was taken to the operating room, underwent general anesthesia, received an abdominal and vaginal preparation per nursing personnel and a Foley catheter was placed in sterile technique.  The timeout was performed by the surgical team.  The patient was draped in the usual fashion.  The abdomen was entered through a Pfannenstiel incision achieving adequate hemostasis at all levels.  The uterus was delivered through the incision with findings of 2 large myomas 1 anterior and 1 posterior.  Several other smaller myomas were palpable.  The serosa overlying both the anterior and posterior larger myomas was infiltrated using vasopressin in a 20 unit to 50 cc dilution.  Subsequently, the serosa overlying the anterior myoma was incised with the electrocautery and through blunt and sharp dissection a large lobulated myoma was excised  intact.  During this process the endometrial cavity was entered and through palpation of the endometrial cavity a large polypoid area was noted.  This was excised and sent separately to pathology.  The remainder of the endometrial cavity palpated and visualized normal with no submucosal myomas or other pathology noted.  The endometrial cavity was closed using 2-0 Vicryl in a running interlocking sub-mucosal stitch.  The myometrium was progressively reapproximated using 0 Vicryl in interrupted figure-of-eight stitch and the serosa was then reapproximated using 3-0 PDS in a running stitch.  Attention was then turned to the larger posterior myoma and the overlying serosa was incised using cautery and through sharp and blunt dissection the lobulated myoma was excised from the patient.  The endometrial cavity was not entered during this excision.  There were 2 small myomas palpated and subsequently excised through this incision.  The myometrium was reapproximated using 0 Vicryl in interrupted figure-of-eight stitch.  The serosa was then reapproximated using 3-0 PDS in a running stitch.  2 small subserosal myomas were then excised from the fundal portion of the uterus.  The serosa of 1 of these excision areas was reapproximated using 3-0 PDS in a running stitch.  The other small incision achieved hemostasis with electrocautery.  The right and left fallopian tube insertion points were away from any of the excision areas.  The uterus was then returned to the abdomen after assuring hemostasis at all incision sites.  The anterior fascia was then reapproximated using 0 Vicryl suture in a running stitch starting at the angle meeting in the middle.  Subcutaneous tissues were irrigated and subsequently infiltrated with Exparel.  Hemostasis was achieved with electrocautery and the skin was reapproximated with 4-0 Vicryl in a  running subcuticular stitch.  Steri-Strips and benzoin were applied as well as a sterile dressing.  The  sponge, needle and instrument count were verified correct.  The surgical specimens were identified for pathology.  The patient was awakened without difficulty and taken to recovery room in good condition having tolerated procedure well.     Anastasio Auerbach MD, 12:31 PM 01/14/2018

## 2018-01-14 NOTE — Anesthesia Procedure Notes (Signed)
Procedure Name: Intubation Date/Time: 01/14/2018 10:44 AM Performed by: Flossie Dibble, CRNA Pre-anesthesia Checklist: Patient identified, Patient being monitored, Timeout performed, Emergency Drugs available and Suction available Patient Re-evaluated:Patient Re-evaluated prior to induction Oxygen Delivery Method: Circle System Utilized Preoxygenation: Pre-oxygenation with 100% oxygen Induction Type: IV induction Ventilation: Mask ventilation without difficulty and Oral airway inserted - appropriate to patient size Laryngoscope Size: Mac and 3 Grade View: Grade I Tube type: Oral Tube size: 7.0 mm Number of attempts: 1 Airway Equipment and Method: stylet Placement Confirmation: ETT inserted through vocal cords under direct vision,  positive ETCO2 and breath sounds checked- equal and bilateral Secured at: 22 cm Tube secured with: Tape Dental Injury: Teeth and Oropharynx as per pre-operative assessment

## 2018-01-14 NOTE — Progress Notes (Signed)
Patient ID: Donna Matthews, female   DOB: Aug 15, 1976, 42 y.o.   MRN: [id number]   Notified that patient has had persistent vaginal bleeding since recovery room.  She has had intermittent spurts where she will will pass a clot and then have scant bleeding.  Patient's vital signs have all remained stable.  Received 1 dose of Methergine 0.25 mg IM.  Started Tranexamic acid 1000 mg IV which is finishing running now.  Hemoglobin check at 1430 was 11.7 hematocrit 33 and platelet count 122,000.  Preoperative hemoglobin was 11.7.  Urine output good  Exam: Patient resting comfortably in bed Blood pressure 133/77, pulse 82 respirations 18.  Clear yellow urine in the Foley Abdomen soft with minimal tenderness.  Dressing dry Perineal inspection shows small patch of blood on the pad with no active significant oozing  Assessment and plan: Postoperative vaginal bleeding more than expected after multiple myomectomy.  Minimal blood loss intraoperative.  Everything appeared dry at the end of the surgery.  Hemoglobin stable from preop although I explained to the patient I expect equilibration and that this number will be lower.  Appears to be responding to the tranexamic acid with scant bleeding now.  We discussed that she probably has bleeding from the endometrial interface where we reapproximated the endometrium.  At this point we we will continue expectant management.  Possible second dose of tranexamic acid.  May consider transfusion if hemoglobin drifts low.  May consider endometrial cavity balloon such as Foley catheter placed for pressure if bleeding continues.  Patient and her family members are comfortable with the plan.

## 2018-01-14 NOTE — Anesthesia Postprocedure Evaluation (Signed)
Anesthesia Post Note  Patient: Donna Matthews  Procedure(s) Performed: ABDOMINAL MYOMECTOMY (N/A Abdomen) POLYPECTOMY (N/A Uterus)     Patient location during evaluation: PACU Anesthesia Type: General Level of consciousness: sedated Pain management: pain level controlled Vital Signs Assessment: post-procedure vital signs reviewed and stable Respiratory status: spontaneous breathing and respiratory function stable Cardiovascular status: stable Postop Assessment: no apparent nausea or vomiting Anesthetic complications: no    Last Vitals:  Vitals:   01/14/18 1345 01/14/18 1404  BP: 130/67   Pulse: 88 88  Resp: 20 17  Temp:  37.4 C  SpO2: 98% 99%    Last Pain:  Vitals:   01/14/18 1345  TempSrc:   PainSc: Asleep   Pain Goal: Patients Stated Pain Goal: 3 (01/14/18 1325)               [place]

## 2018-01-14 NOTE — Transfer of Care (Signed)
Immediate Anesthesia Transfer of Care Note  Patient: Donna Matthews  Procedure(s) Performed: ABDOMINAL MYOMECTOMY (N/A Abdomen) POLYPECTOMY (N/A Uterus)  Patient Location: PACU  Anesthesia Type:General  Level of Consciousness: awake, alert  and oriented  Airway & Oxygen Therapy: Patient Spontanous Breathing and Patient connected to nasal cannula oxygen  Post-op Assessment: Report given to RN and Post -op Vital signs reviewed and stable  Post vital signs: Reviewed and stable  Last Vitals:  Vitals:   01/14/18 0932  BP: (!) 154/85  Pulse: 98  Resp: 16  Temp: 37.2 C  SpO2: 100%    Last Pain:  Vitals:   01/14/18 0932  TempSrc: Oral      Patients Stated Pain Goal: 3 (97/67/34 1937)  Complications: No apparent anesthesia complications

## 2018-01-14 NOTE — Anesthesia Preprocedure Evaluation (Addendum)
Anesthesia Evaluation  Patient identified by MRN, date of birth, ID band Patient awake    Reviewed: Allergy & Precautions, NPO status , Patient's Chart, lab work & pertinent test results  History of Anesthesia Complications (+) PONV and history of anesthetic complications  Airway Mallampati: II  TM Distance: >3 FB Neck ROM: Full    Dental no notable dental hx. (+) Dental Advisory Given   Pulmonary neg pulmonary ROS,    Pulmonary exam normal        Cardiovascular negative cardio ROS Normal cardiovascular exam  ECG: ST   Neuro/Psych negative neurological ROS  negative psych ROS   GI/Hepatic Neg liver ROS, GERD  Controlled,  Endo/Other  negative endocrine ROS  Renal/GU negative Renal ROS     Musculoskeletal   Abdominal (+) + obese,   Peds  Hematology  (+) anemia ,   Anesthesia Other Findings   Reproductive/Obstetrics                            Anesthesia Physical  Anesthesia Plan  ASA: II  Anesthesia Plan: General   Post-op Pain Management:    Induction: Intravenous  PONV Risk Score and Plan: 4 or greater and Scopolamine patch - Pre-op, Midazolam, Dexamethasone, Ondansetron and Treatment may vary due to age or medical condition  Airway Management Planned: Oral ETT  Additional Equipment:   Intra-op Plan:   Post-operative Plan: Extubation in OR  Informed Consent: I have reviewed the patients History and Physical, chart, labs and discussed the procedure including the risks, benefits and alternatives for the proposed anesthesia with the patient or authorized representative who has indicated his/her understanding and acceptance.   Dental advisory given  Plan Discussed with: CRNA  Anesthesia Plan Comments:         Anesthesia Quick Evaluation

## 2018-01-14 NOTE — H&P (Signed)
The patient was examined.  I reviewed the proposed surgery and consent form with the patient.  The dictated history and physical is current and accurate and all questions were answered. The patient is ready to proceed with surgery and has a realistic understanding and expectation for the outcome.   Anastasio Auerbach MD, 10:11 AM 01/14/2018

## 2018-01-14 NOTE — Progress Notes (Signed)
Patient ID: Donna Matthews, female   DOB: 21-May-1976, 42 y.o.   MRN: [id number]  Back in to see the patient Reports doing well Scant bleeding on the pad Continue expectant management tonight, remain with bed rest Recheck CBC in AM

## 2018-01-15 ENCOUNTER — Encounter (HOSPITAL_COMMUNITY): Payer: Self-pay | Admitting: Gynecology

## 2018-01-15 DIAGNOSIS — D219 Benign neoplasm of connective and other soft tissue, unspecified: Secondary | ICD-10-CM | POA: Diagnosis not present

## 2018-01-15 LAB — CBC
HCT: 31.8 % — ABNORMAL LOW (ref 36.0–46.0)
Hemoglobin: 11.1 g/dL — ABNORMAL LOW (ref 12.0–15.0)
MCH: 27.7 pg (ref 26.0–34.0)
MCHC: 34.9 g/dL (ref 30.0–36.0)
MCV: 79.3 fL (ref 78.0–100.0)
PLATELETS: 181 10*3/uL (ref 150–400)
RBC: 4.01 MIL/uL (ref 3.87–5.11)
RDW: 15.6 % — AB (ref 11.5–15.5)
WBC: 9.7 10*3/uL (ref 4.0–10.5)

## 2018-01-15 MED ORDER — OXYCODONE-ACETAMINOPHEN 5-325 MG PO TABS
2.0000 | ORAL_TABLET | Freq: Four times a day (QID) | ORAL | Status: DC | PRN
  Administered 2018-01-15: 2 via ORAL
  Filled 2018-01-15: qty 2

## 2018-01-15 MED ORDER — IBUPROFEN 800 MG PO TABS
800.0000 mg | ORAL_TABLET | Freq: Three times a day (TID) | ORAL | Status: DC
  Administered 2018-01-15 – 2018-01-16 (×3): 800 mg via ORAL
  Filled 2018-01-15 (×3): qty 1

## 2018-01-15 NOTE — Progress Notes (Signed)
Patient ID: Donna Matthews, female   DOB: [birth year], 42 y.o.   MRN: [id number] Donna Matthews 1976/07/16 [id number]   1 Day Post-Op s/p Procedure(s): ABDOMINAL MYOMECTOMY POLYPECTOMY  Subjective: Patient reports feels well, no acute distress, pain severity reported mild, Yes.   taking PO, foley catheter in place, Yes.   ambulating, No. passing flatus  Objective: Vital signs in last 24 hours: Temp:  [97.9 F (36.6 C)-99.7 F (37.6 C)] 97.9 F (36.6 C) (02/06 0524) Pulse Rate:  [66-98] 66 (02/06 0524) Resp:  [10-23] 14 (02/06 0550) BP: (121-154)/(57-85) 121/57 (02/06 0524) SpO2:  [97 %-100 %] 100 % (02/06 0550) Weight:  [187 lb (84.8 kg)] 187 lb (84.8 kg) (02/05 1426) Last BM Date: 01/14/18  EXAM General: awake, alert and no distress Resp: clear to auscultation bilaterally Cardio: regular rate and rhythm GI: soft, mild tenderness, bowel sounds present, dressing dry Lower Extremities: Without swelling or tenderness Vaginal Bleeding: reported scant  Lab Results:  Results for orders placed or performed during the hospital encounter of 01/14/18 (from the past 24 hour(s))  Prepare RBC (crossmatch)     Status: None   Collection Time: 01/14/18  9:03 AM  Result Value Ref Range   Order Confirmation      ORDER PROCESSED BY BLOOD BANK Performed at [facility], [address] [place], [place] [address]   Type and screen [place]     Status: None (Preliminary result)   Collection Time: 01/14/18  9:10 AM  Result Value Ref Range   ABO/RH(D) O POS    Antibody Screen NEG    Sample Expiration      01/17/2018 Performed at [facility], [address], [place], [place] [address]    Unit Number [id number]    Blood Component Type RED CELLS,LR    Unit division 00    Status of Unit ALLOCATED    Transfusion Status OK TO TRANSFUSE    Crossmatch Result Compatible    Unit Number [id number]    Blood Component Type RED CELLS,LR    Unit division 00    Status of  Unit ALLOCATED    Transfusion Status OK TO TRANSFUSE    Crossmatch Result Compatible   Pregnancy, urine     Status: None   Collection Time: 01/14/18  9:10 AM  Result Value Ref Range   Preg Test, Ur NEGATIVE NEGATIVE  CBC     Status: Abnormal   Collection Time: 01/14/18  4:35 PM  Result Value Ref Range   WBC 13.5 (H) 4.0 - 10.5 K/uL   RBC 4.19 3.87 - 5.11 MIL/uL   Hemoglobin 11.7 (L) 12.0 - 15.0 g/dL   HCT 33.3 (L) 36.0 - 46.0 %   MCV 79.5 78.0 - 100.0 fL   MCH 27.9 26.0 - 34.0 pg   MCHC 35.1 30.0 - 36.0 g/dL   RDW 15.5 11.5 - 15.5 %   Platelets 122 (L) 150 - 400 K/uL  CBC     Status: Abnormal   Collection Time: 01/15/18  5:57 AM  Result Value Ref Range   WBC 9.7 4.0 - 10.5 K/uL   RBC 4.01 3.87 - 5.11 MIL/uL   Hemoglobin 11.1 (L) 12.0 - 15.0 g/dL   HCT 31.8 (L) 36.0 - 46.0 %   MCV 79.3 78.0 - 100.0 fL   MCH 27.7 26.0 - 34.0 pg   MCHC 34.9 30.0 - 36.0 g/dL   RDW 15.6 (H) 11.5 - 15.5 %   Platelets 181 150 - 400  K/uL    Assessment: s/p Procedure(s): ABDOMINAL MYOMECTOMY POLYPECTOMY: stable and progressing well.  Am Hb 11.1  Last PM 11.7.  Pre op Hb 11.7.  Does not appear to have been bleeding as significant as reported yesterday.  Doing well this AM.  Ambulated x 1  Plan: Continue routine post operative care  Advance diet Encourage ambulation Advance to PO medication  Possible discharge later today   LOS: 1 day    Anastasio Auerbach MD, 7:38 AM 01/15/2018

## 2018-01-15 NOTE — Progress Notes (Signed)
Patient ID: Donna Matthews, female   DOB: [birth year], 42 y.o.   MRN: [id number]   Doing well.  Notes uterine cramping and light bleeding  Afeb, VSS  Abdomen soft, minimal tenderness.  Incision dry intact.  Add Ibuprofen 800 tid, continue routine PO care with tentative discharge in am

## 2018-01-16 ENCOUNTER — Telehealth: Payer: Self-pay

## 2018-01-16 DIAGNOSIS — D219 Benign neoplasm of connective and other soft tissue, unspecified: Secondary | ICD-10-CM | POA: Diagnosis not present

## 2018-01-16 MED ORDER — IBUPROFEN 800 MG PO TABS: 800.0000 mg | ORAL_TABLET | Freq: Three times a day (TID) | ORAL | 0 refills | Status: DC

## 2018-01-16 MED ORDER — OXYCODONE-ACETAMINOPHEN 5-325 MG PO TABS: 2.0000 | ORAL_TABLET | Freq: Four times a day (QID) | ORAL | 0 refills | Status: DC | PRN

## 2018-01-16 NOTE — Telephone Encounter (Signed)
I called patient and left a message in her voice mail that I received disability form but do not have signed records release form on file giving me permission to release information for [employer].  I told her there are several ways for her to obtain and sign one and quickly reviewed those with her in voice mail. I also mentioned the $25 fee for the intial set of forms.  I asked her to call me. I did let her know I will not be in the office tomorrow.

## 2018-01-16 NOTE — Discharge Instructions (Signed)
Postoperative Instructions Myomectomy  Dr. Phineas Real and the nursing staff have discussed postoperative instructions with you.  If you have any questions please ask them before you leave the hospital, or call Dr Elisabeth Most office at [phone].    We would like to emphasize the following instructions:   ? Call the office to make your follow-up appointment as recommended by Dr Phineas Real (usually 2 weeks).  ? You were given a prescription, or one was ordered for you at the pharmacy you designated.  Get that prescription filled and take the medication according to instructions.  ? You may eat a regular diet, but slowly until you start having bowel movements.  ? Drink plenty of water daily.  ? Nothing in the vagina (intercourse, douching, objects of any kind) until released by Dr Phineas Real.  ? No driving for two weeks.  Wait to be cleared by Dr Phineas Real at your first post op check.  Car rides (short) are ok after several days at home, as long as you are not having significant pain, but no traveling out of town.  ? You may shower, but no baths.  Walking up and down stairs is ok.  No heavy lifting, prolonged standing, repeated bending or any working out until your first post op check.  ? Rest frequently, listen to your body and do not push yourself and overdo it.  ? Call if:  o Your pain medication does not seem strong enough. o Worsening pain or abdominal bloating o Persistent nausea or vomiting o Difficulty with urination or bowel movements. o Temperature of 101 degrees or higher. o Incisions become red, tender or begin to drain. o You have any questions or concerns.

## 2018-01-16 NOTE — Progress Notes (Signed)
Patient ID: Donna Matthews, female   DOB: Jul 04, 1976, 42 y.o.   MRN: [id number] Donna Matthews Jan 27, 1976 [id number]   2 Days Post-Op Procedure(s) (LRB): ABDOMINAL MYOMECTOMY (N/A) POLYPECTOMY (N/A)  Subjective: Patient reports no complaints, no acute distress, pain severity reported mild, Yes.   taking PO, Yes.   voiding, Yes.   ambulating, Yes.   passing flatus  Objective: Vital signs in last 24 hours: Temp:  [98.5 F (36.9 C)-99.2 F (37.3 C)] 98.5 F (36.9 C) (02/07 0619) Pulse Rate:  [70-89] 89 (02/07 0619) Resp:  [15-18] 18 (02/07 0619) BP: (91-128)/(42-74) 91/56 (02/07 0619) SpO2:  [97 %-100 %] 98 % (02/07 0619) Last BM Date: 01/14/18    EXAM General: awake, alert and no distress Resp: clear to auscultation bilaterally Cardio: regular rate and rhythm GI: soft, minimal tender, bowel sounds active, dressing dry Lower Extremities: Without swelling or tenderness Vaginal Bleeding: Reported scant   Lab Results:  Recent Labs    01/14/18 1635 01/15/18 0557  WBC 13.5* 9.7  HGB 11.7* 11.1*  HCT 33.3* 31.8*  PLT 122* 181    Assessment: s/p Procedure(s): ABDOMINAL MYOMECTOMY POLYPECTOMY: progressing well, ready for discharge.    Plan: Discharge home today.  Precautions, instructions and follow up were discussed with the patient.  Prescriptions provided per AVS.  Patient to call the office to arrange a post-operative appointmant in 2 weeks.    Anastasio Auerbach MD, 7:32 AM 01/16/2018

## 2018-01-16 NOTE — Progress Notes (Signed)
Discharge instructions given, questions answered, pt states understanding, signed and given copy 

## 2018-01-17 ENCOUNTER — Telehealth: Payer: Self-pay | Admitting: *Deleted

## 2018-01-17 NOTE — Telephone Encounter (Signed)
Cream is okay anywhere except around the incision

## 2018-01-17 NOTE — Telephone Encounter (Signed)
Left detailed message on voicemail.  

## 2018-01-17 NOTE — Telephone Encounter (Signed)
Pt post op abdominal myomectomy on 01/14/18, asked if okay to use cream on inner thighs for rash. I told pt I think this is fine, but I would confirm with you. Please advise

## 2018-01-18 LAB — TYPE AND SCREEN
ABO/RH(D): O POS
Antibody Screen: NEGATIVE
UNIT DIVISION: 0
Unit division: 0

## 2018-01-18 LAB — BPAM RBC
Blood Product Expiration Date: 201903052359
Blood Product Expiration Date: 201903072359
UNIT TYPE AND RH: 5100
Unit Type and Rh: 5100

## 2018-01-20 NOTE — Telephone Encounter (Signed)
I called patient to follow up about needing release form to be able to send her records. Also, fee payment. She did receive my message. I will mail her a release form and she will bring it completed next Tuesday at her pre op visit.  I called her back and told her when I called [employer] and let them know it will be next Weds before I fax form as I am waiting on signed med recs release form they told me that someone will call her and arrange that over the phone with her and they will send it to me.  I gave her heads up about this but told her I still want her to complete the [facility] form as well.

## 2018-01-21 ENCOUNTER — Telehealth: Payer: Self-pay | Admitting: *Deleted

## 2018-01-21 NOTE — Telephone Encounter (Signed)
Pt post abdominal myomectomy on 01/14/18 asked if the bandage from incision site can be removed now?

## 2018-01-21 NOTE — Telephone Encounter (Signed)
Pt informed

## 2018-01-21 NOTE — Telephone Encounter (Signed)
Bandage was supposed to be removed when she got home end of last week.  Go ahead and remove it now but leave the Steri-Strips (tapes going across the incision) in place.  She can get under the shower and let water run right over the incision.

## 2018-01-28 ENCOUNTER — Ambulatory Visit (INDEPENDENT_AMBULATORY_CARE_PROVIDER_SITE_OTHER): Payer: 59 | Admitting: Gynecology

## 2018-01-28 ENCOUNTER — Encounter: Payer: Self-pay | Admitting: Gynecology

## 2018-01-28 VITALS — BP 124/78

## 2018-01-28 DIAGNOSIS — Z9889 Other specified postprocedural states: Secondary | ICD-10-CM

## 2018-01-28 NOTE — Patient Instructions (Signed)
Follow-up end of this year for annual exam, sooner if any issues

## 2018-01-28 NOTE — Progress Notes (Signed)
    Donna Matthews October 19, 1976 [id number]        42 y.o.  G2P0020 presents for her postoperative visit status post open myomectomy and removal of endometrial polyp.  Has done well without complaints.  Minimal bleeding.  Minimal pain using OTC ibuprofen.  Past medical history,surgical history, problem list, medications, allergies, family history and social history were all reviewed and documented in the EPIC chart.  Directed ROS with pertinent positives and negatives documented in the history of present illness/assessment and plan.  Exam: Vitals:   01/28/18 1426  BP: 124/78   General appearance:  Normal Abdomen soft nontender without masses guarding rebound.  Pfannenstiel incision healing nicely.  Steri-Strips are removed.  Assessment/Plan:  42 y.o. G2P0020 with normal postoperative visit status post open myomectomy and removal of endometrial polyp.  Reviewed benign pathology with her and pictures from the surgery.  Patient will slowly resume all activities.  Nothing in the vagina until 8 weeks postop as well as no abdominal stress activities.  Assuming she does well then she will follow-up end of this year when due for annual exam, sooner if any issues.    Anastasio Auerbach MD, 2:43 PM 01/28/2018

## 2018-01-30 DIAGNOSIS — Z0289 Encounter for other administrative examinations: Secondary | ICD-10-CM

## 2018-02-04 ENCOUNTER — Telehealth: Payer: Self-pay

## 2018-02-04 NOTE — Telephone Encounter (Signed)
Patient called about getting her disability info sent to [facility]. I reminded her she needed to fill out release form. She said that she did this last week and paid her fee. Upon following up on this Donna Matthews had the form because it was incomplete.  I spoke with patient and explained this. She will come by today or tomorrow and I will assist her with completing the med rec release form so I can send her info to [facility].

## 2018-02-05 ENCOUNTER — Encounter: Payer: Self-pay | Admitting: Gynecology

## 2018-02-21 ENCOUNTER — Encounter: Payer: Self-pay | Admitting: Gynecology

## 2018-02-21 ENCOUNTER — Ambulatory Visit (INDEPENDENT_AMBULATORY_CARE_PROVIDER_SITE_OTHER): Payer: 59 | Admitting: Gynecology

## 2018-02-21 VITALS — BP 122/76

## 2018-02-21 DIAGNOSIS — R1032 Left lower quadrant pain: Secondary | ICD-10-CM | POA: Diagnosis not present

## 2018-02-21 NOTE — Patient Instructions (Signed)
Call if the pain persists and we will schedule an ultrasound.

## 2018-02-21 NOTE — Progress Notes (Signed)
    Donna Matthews 04/09/1976 [id number]        42 y.o.  G2P0020 presents having undergone abdominal myomectomy 01/14/2018.  Has done well since then until over the last several days when she has noticed the onset of left lower quadrant discomfort.  Seems mostly when she is up walking.  When laying down does not have the pain.  Was not having this previously.  Is doing regular activities.  Has yet to have a menses having been suppressed with Depo-Lupron with last injection very end of November.  No nausea vomiting diarrhea constipation.  No fever or chills.  No urinary symptoms such as frequency dysuria urgency low back pain fever or chills.  Past medical history,surgical history, problem list, medications, allergies, family history and social history were all reviewed and documented in the EPIC chart.  Directed ROS with pertinent positives and negatives documented in the history of present illness/assessment and plan.  Exam: [name] assistant Vitals:   02/21/18 1527  BP: 122/76   General appearance:  Normal Abdomen soft nontender without masses guarding rebound.  Abdominal incision healed nicely.  No evidence of hernia or underlying cyst subcutaneous masses. Pelvic external BUS vagina normal.  Cervix normal.  Uterus grossly normal midline mobile nontender.  Adnexa without masses or tenderness  Assessment/Plan:  42 y.o. G2P0020 with new onset left lower quadrant discomfort.  Incisions healed nicely without evidence of abnormalities or hernia.  Pelvic exam without masses or significant tenderness.  Reviewed various possibilities to include muscular/fascial/incisional with becoming more active may have stressed in the area.  Alternatives to include wearing off of Depo-Lupron possible ovarian activity.  Recommend rest over the weekend with heat in the left lower quadrant.  If discomfort persists then she will call and we will start with ultrasound for pelvic surveillance.  She did ask about another  week off of work as she is to return to work now and I think it is appropriate to extend her 1 more week.    Anastasio Auerbach MD, 3:40 PM 02/21/2018

## 2018-02-26 ENCOUNTER — Encounter: Payer: Self-pay | Admitting: Gynecology

## 2018-03-03 ENCOUNTER — Telehealth: Payer: Self-pay | Admitting: *Deleted

## 2018-03-03 DIAGNOSIS — R1032 Left lower quadrant pain: Secondary | ICD-10-CM

## 2018-03-03 NOTE — Telephone Encounter (Signed)
Patient scheduled for ultrasound and office visit.

## 2018-03-03 NOTE — Telephone Encounter (Signed)
Pt called to follow up from [place] on 02/21/18 states the lower quad discomfort is not as bad, still has it when moving around, per note told to call if persist and pelvic ultrasound will be ordered. Order will be placed and pt will be scheduled, she returns to work tomorrow. If nonething else needs to be relayed to patient, will have front desk call to schedule.

## 2018-03-03 NOTE — Telephone Encounter (Signed)
Okay to schedule ultrasound otherwise nothing further

## 2018-03-03 NOTE — Telephone Encounter (Signed)
Donna Matthews can you call and schedule ultrasound for pt, order placed.

## 2018-03-12 ENCOUNTER — Telehealth: Payer: Self-pay

## 2018-03-12 NOTE — Telephone Encounter (Signed)
Patient called me today because [employer] told her that they never received anything about her one week extension to return to work on 03/04/18 even though I faxed letter and office notes and have fax confirmation on 03/03/18.  I told patient this was indeed done. I resent that today along with form they faxed this morning with return to work date on it. Patient advised if anymore issues to let me know what they need from [place].

## 2018-03-17 ENCOUNTER — Encounter: Payer: Self-pay | Admitting: Gynecology

## 2018-03-17 ENCOUNTER — Ambulatory Visit (INDEPENDENT_AMBULATORY_CARE_PROVIDER_SITE_OTHER): Payer: 59

## 2018-03-17 ENCOUNTER — Other Ambulatory Visit: Payer: Self-pay | Admitting: Gynecology

## 2018-03-17 ENCOUNTER — Ambulatory Visit (INDEPENDENT_AMBULATORY_CARE_PROVIDER_SITE_OTHER): Payer: 59 | Admitting: Gynecology

## 2018-03-17 VITALS — BP 124/82

## 2018-03-17 DIAGNOSIS — N858 Other specified noninflammatory disorders of uterus: Secondary | ICD-10-CM

## 2018-03-17 DIAGNOSIS — N83202 Unspecified ovarian cyst, left side: Secondary | ICD-10-CM

## 2018-03-17 DIAGNOSIS — N898 Other specified noninflammatory disorders of vagina: Secondary | ICD-10-CM

## 2018-03-17 DIAGNOSIS — R1032 Left lower quadrant pain: Secondary | ICD-10-CM

## 2018-03-17 DIAGNOSIS — R21 Rash and other nonspecific skin eruption: Secondary | ICD-10-CM

## 2018-03-17 DIAGNOSIS — D259 Leiomyoma of uterus, unspecified: Secondary | ICD-10-CM

## 2018-03-17 LAB — WET PREP FOR TRICH, YEAST, CLUE

## 2018-03-17 MED ORDER — FLUCONAZOLE 150 MG PO TABS: 150.0000 mg | ORAL_TABLET | Freq: Every day | ORAL | 0 refills | Status: DC

## 2018-03-17 NOTE — Patient Instructions (Signed)
Take the Diflucan pill daily for 5 days. Use an over-the-counter antifungal for the skin and use as directed  Follow-up if you do not resume normal menses in another month.

## 2018-03-17 NOTE — Progress Notes (Addendum)
    Donna Matthews Jun 15, 1976 [id number]        42 y.o.  G2P0020 presents for ultrasound.  History of myomectomy and endometrial polyp resection with left lower quadrant pain several weeks ago.  Patient notes that her pain has resolved.  Also complaining of vaginal itching.  No significant discharge or odor.  No urinary symptoms such as frequency dysuria urgency low back pain fever or chills.  Past medical history,surgical history, problem list, medications, allergies, family history and social history were all reviewed and documented in the EPIC chart.  Directed ROS with pertinent positives and negatives documented in the history of present illness/assessment and plan.  Exam: [name] assistant Vitals:   03/17/18 1543  BP: 124/82   General appearance:  Normal Abdomen soft nontender without masses guarding rebound Pelvic external BUS vagina with scant discharge.  Groin rash bilaterally consistent with fungal.  Cervix normal.  Uterus grossly normal midline mobile nontender.  Adnexa without masses or tenderness.  Ultrasound transvaginal shows uterus grossly normal in size with small intramural myoma noted 16 x 23 mm.  Endometrial cavity with fluid 38 x 11 x 30 mm.  Endometrial echo 5.9 mm.  Small cystic area noted at 6 mm.  Right ovary normal.  Left ovary with collapsed cyst wall 22 x 14 x 14 mm.  Cul-de-sac negative  Assessment/Plan:  42 y.o. G2P0020 with:  1. History of left lower quadrant discomfort which has now resolved.  Ultrasound shows collapsed cyst on the left which I think is return of ovulatory function accounting for her pain.  She will keep menstrual calendar and as long as resumes regular menses then will follow expectantly.  Ultrasound otherwise some fluid in the cavity which would not be unexpected.  Small cystic area again not unexpected following surgery with cavity entry.  Also notes small myoma 16 x 23 mm that we discussed. 2. Patient also complaining of itching.  Wet prep  was negative but her exam includes bilateral inguinal rashes consistent with fungal.  Recommend OTC dermatologic antifungal externally.  Will also cover with Diflucan 150 mg daily times 5 days.  Follow-up if her symptoms persist, worsen or recur.    Anastasio Auerbach MD, 4:31 PM 03/17/2018

## 2018-04-16 ENCOUNTER — Telehealth: Payer: Self-pay | Admitting: *Deleted

## 2018-04-16 MED ORDER — NYSTATIN 100000 UNIT/GM EX CREA: 1.0000 "application " | TOPICAL_CREAM | Freq: Two times a day (BID) | CUTANEOUS | 0 refills | Status: DC

## 2018-04-16 NOTE — Telephone Encounter (Signed)
Okay if she is talking about nystatin powder.

## 2018-04-16 NOTE — Telephone Encounter (Signed)
Okay to refill the nystatin cream.

## 2018-04-16 NOTE — Telephone Encounter (Signed)
Patient called requesting nystatin Rx for bilateral inguinal rashes as noted on 03/17/18 office visit, states she doesn't have to pay for Rx with her health insurance account. Please advise

## 2018-04-16 NOTE — Telephone Encounter (Signed)
Rx sent 

## 2018-04-16 NOTE — Telephone Encounter (Signed)
Patient said she is talking about the cream, states she has Rx for nystatin cream and the tube is almost out, she has never used powder.

## 2018-05-27 ENCOUNTER — Telehealth: Payer: Self-pay | Admitting: *Deleted

## 2018-05-27 NOTE — Telephone Encounter (Signed)
Patient called to update, had abdominal myomectomy in Feb 2019, had bleeding after surgery, no full/normal cycle since prior to surgery. Has only had spotting off/on some months, had spotting in May and none this month yet. She asked if this normal? still has left side discomfort off/on as well, but not intense pain. Please advise

## 2018-05-27 NOTE — Telephone Encounter (Signed)
Patient informed, transferred to front desk to schedule.

## 2018-05-27 NOTE — Telephone Encounter (Signed)
Not unusual to have some irregularity following surgery but I would recommend office visit at her convenience just to check things out.

## 2018-06-03 ENCOUNTER — Ambulatory Visit (INDEPENDENT_AMBULATORY_CARE_PROVIDER_SITE_OTHER): Payer: 59 | Admitting: Gynecology

## 2018-06-03 ENCOUNTER — Encounter: Payer: Self-pay | Admitting: Gynecology

## 2018-06-03 VITALS — BP 120/78

## 2018-06-03 DIAGNOSIS — N912 Amenorrhea, unspecified: Secondary | ICD-10-CM | POA: Diagnosis not present

## 2018-06-03 DIAGNOSIS — R8290 Unspecified abnormal findings in urine: Secondary | ICD-10-CM | POA: Diagnosis not present

## 2018-06-03 MED ORDER — MEDROXYPROGESTERONE ACETATE 10 MG PO TABS: 10.0000 mg | ORAL_TABLET | Freq: Every day | ORAL | 0 refills | Status: DC

## 2018-06-03 NOTE — Patient Instructions (Signed)
Take the prescribed progesterone pill daily for 10 days.  If you do not have any bleeding after this then call and we will plan on scheduling an ultrasound.  If you do have bleeding then monitor and follow for your next period.  If you go more than 2 months without a period then call and we will plan on progesterone withdrawal again.

## 2018-06-03 NOTE — Progress Notes (Signed)
    Donna Matthews [birth year] [id number]        42 y.o.  G2P0020 presents not having any menses since her myomectomy 01/2018.  She had normal bleeding afterwards and at home for the first week or 2 but then since then has had no menses.  She has had no significant pelvic pain or cramping.  She did have some left lower quadrant discomfort approximately 1 to 2 months postop where an ultrasound showed small amount of fluid within the endometrial cavity and a probable corpus luteum on the left ovary.  Patient also notes dark color and odor to her urine.  No frequency dysuria urgency low back pain fever or chills.  Has not been sexually active since surgery.  No significant weight changes hair changes diarrhea constipation.  Past medical history,surgical history, problem list, medications, allergies, family history and social history were all reviewed and documented in the EPIC chart.  Directed ROS with pertinent positives and negatives documented in the history of present illness/assessment and plan.  Exam: [name] assistant Vitals:   06/03/18 1226  BP: 120/78   General appearance:  Normal Abdomen soft nontender without mass guarding rebound Pelvic external BUS vagina normal.  Cervix normal.  Uterus normal size midline mobile nontender.  Adnexa without masses or tenderness.  Assessment/Plan:  42 y.o. G2P0020 with amenorrhea since her surgery 4 months ago.  No pregnancy possibility.  No significant discomfort to suggest hematometria.  Reviewed differential to include outlet obstruction and ovulatory irregularity as most likely cause.  Various reasons for ovulatory irregularity was discussed.  Will check baseline labs to include CBC as she was somewhat anemic after surgery, FSH TSH and prolactin.  We will plan on progesterone withdrawal with Provera 10 mg x 10 days.  If has bleeding then will keep menstrual calendar and if goes more than 8 weeks without menses will call for progesterone withdrawal.  If  no withdrawal then will call and we will plan on scheduling an ultrasound to rule out hematometria.  I spent a total of 15 face-to-face minutes with the patient, over 50% was spent counseling and coordination of care.     Anastasio Auerbach MD, 12:36 PM 06/03/2018

## 2018-06-04 LAB — CBC WITH DIFFERENTIAL/PLATELET
BASOS ABS: 12 {cells}/uL (ref 0–200)
Basophils Relative: 0.2 %
Eosinophils Absolute: 30 cells/uL (ref 15–500)
Eosinophils Relative: 0.5 %
HEMATOCRIT: 33.4 % — AB (ref 35.0–45.0)
Hemoglobin: 12 g/dL (ref 11.7–15.5)
LYMPHS ABS: 1971 {cells}/uL (ref 850–3900)
MCH: 27.5 pg (ref 27.0–33.0)
MCHC: 35.9 g/dL (ref 32.0–36.0)
MCV: 76.4 fL — AB (ref 80.0–100.0)
MPV: 10.9 fL (ref 7.5–12.5)
Monocytes Relative: 10.3 %
NEUTROS PCT: 55.6 %
Neutro Abs: 3280 cells/uL (ref 1500–7800)
PLATELETS: 265 10*3/uL (ref 140–400)
RBC: 4.37 10*6/uL (ref 3.80–5.10)
RDW: 14.4 % (ref 11.0–15.0)
TOTAL LYMPHOCYTE: 33.4 %
WBC: 5.9 10*3/uL (ref 3.8–10.8)
WBCMIX: 608 {cells}/uL (ref 200–950)

## 2018-06-04 LAB — TSH: TSH: 3.29 mIU/L

## 2018-06-04 LAB — PROLACTIN: Prolactin: 7.3 ng/mL

## 2018-06-04 LAB — FOLLICLE STIMULATING HORMONE: FSH: 3.4 m[IU]/mL

## 2018-06-05 LAB — URINALYSIS, COMPLETE W/RFL CULTURE
Bilirubin Urine: NEGATIVE
GLUCOSE, UA: NEGATIVE
HYALINE CAST: NONE SEEN /LPF
KETONES UR: NEGATIVE
LEUKOCYTE ESTERASE: NEGATIVE
Nitrites, Initial: NEGATIVE
Protein, ur: NEGATIVE
Specific Gravity, Urine: 1.02 (ref 1.001–1.03)
pH: 5 (ref 5.0–8.0)

## 2018-06-05 LAB — URINE CULTURE
MICRO NUMBER: 90762869
SPECIMEN QUALITY: ADEQUATE

## 2018-06-05 LAB — CULTURE INDICATED

## 2018-07-25 ENCOUNTER — Telehealth: Payer: Self-pay | Admitting: *Deleted

## 2018-07-25 NOTE — Telephone Encounter (Signed)
Patient called c/o bilateral nipple pain, no cycle, spotting only in June, took progesterone 10 mg x 10 days as recommended. Patient said she is worried about nipple I advised her to schedule visit next with provider. Patient will call back to schedule

## 2018-08-12 ENCOUNTER — Other Ambulatory Visit: Payer: Self-pay | Admitting: Adult Health

## 2018-08-12 DIAGNOSIS — Z9889 Other specified postprocedural states: Secondary | ICD-10-CM

## 2018-10-29 ENCOUNTER — Ambulatory Visit
Admission: RE | Admit: 2018-10-29 | Discharge: 2018-10-29 | Disposition: A | Discharge status: Home / Self Care | Payer: 59 | Source: Ambulatory Visit | Attending: Adult Health | Admitting: Adult Health

## 2018-10-29 DIAGNOSIS — Z9889 Other specified postprocedural states: Secondary | ICD-10-CM

## 2018-12-08 ENCOUNTER — Encounter: Payer: Self-pay | Admitting: Women's Health

## 2018-12-08 ENCOUNTER — Ambulatory Visit (INDEPENDENT_AMBULATORY_CARE_PROVIDER_SITE_OTHER): Payer: 59 | Admitting: Women's Health

## 2018-12-08 VITALS — BP 124/80 | Ht 64.0 in | Wt 191.0 lb

## 2018-12-08 DIAGNOSIS — Z113 Encounter for screening for infections with a predominantly sexual mode of transmission: Secondary | ICD-10-CM | POA: Diagnosis not present

## 2018-12-08 DIAGNOSIS — Z01419 Encounter for gynecological examination (general) (routine) without abnormal findings: Secondary | ICD-10-CM

## 2018-12-08 MED ORDER — NYSTATIN 100000 UNIT/GM EX CREA: 1.0000 "application " | TOPICAL_CREAM | Freq: Two times a day (BID) | CUTANEOUS | 3 refills | Status: DC

## 2018-12-08 NOTE — Patient Instructions (Signed)
Carbohydrate Counting for Diabetes Mellitus, Adult  Carbohydrate counting is a method of keeping track of how many carbohydrates you eat. Eating carbohydrates naturally increases the amount of sugar (glucose) in the blood. Counting how many carbohydrates you eat helps keep your blood glucose within normal limits, which helps you manage your diabetes (diabetes mellitus). It is important to know how many carbohydrates you can safely have in each meal. This is different for every person. A diet and nutrition specialist (registered dietitian) can help you make a meal plan and calculate how many carbohydrates you should have at each meal and snack. Carbohydrates are found in the following foods:  Grains, such as breads and cereals.  Dried beans and soy products.  Starchy vegetables, such as potatoes, peas, and corn.  Fruit and fruit juices.  Milk and yogurt.  Sweets and snack foods, such as cake, cookies, candy, chips, and soft drinks. How do I count carbohydrates? There are two ways to count carbohydrates in food. You can use either of the methods or a combination of both. Reading "Nutrition Facts" on packaged food The "Nutrition Facts" list is included on the labels of almost all packaged foods and beverages in the [place] It includes:  The serving size.  Information about nutrients in each serving, including the grams (g) of carbohydrate per serving. To use the "Nutrition Facts":  Decide how many servings you will have.  Multiply the number of servings by the number of carbohydrates per serving.  The resulting number is the total amount of carbohydrates that you will be having. Learning standard serving sizes of other foods When you eat carbohydrate foods that are not packaged or do not include "Nutrition Facts" on the label, you need to measure the servings in order to count the amount of carbohydrates:  Measure the foods that you will eat with a food scale or measuring cup, if  needed.  Decide how many standard-size servings you will eat.  Multiply the number of servings by 15. Most carbohydrate-rich foods have about 15 g of carbohydrates per serving. ? For example, if you eat 8 oz (170 g) of strawberries, you will have eaten 2 servings and 30 g of carbohydrates (2 servings x 15 g = 30 g).  For foods that have more than one food mixed, such as soups and casseroles, you must count the carbohydrates in each food that is included. The following list contains standard serving sizes of common carbohydrate-rich foods. Each of these servings has about 15 g of carbohydrates:   hamburger bun or  English muffin.   oz (15 mL) syrup.   oz (14 g) jelly.  1 slice of bread.  1 six-inch tortilla.  3 oz (85 g) cooked rice or pasta.  4 oz (113 g) cooked dried beans.  4 oz (113 g) starchy vegetable, such as peas, corn, or potatoes.  4 oz (113 g) hot cereal.  4 oz (113 g) mashed potatoes or  of a large baked potato.  4 oz (113 g) canned or frozen fruit.  4 oz (120 mL) fruit juice.  4-6 crackers.  6 chicken nuggets.  6 oz (170 g) unsweetened dry cereal.  6 oz (170 g) plain fat-free yogurt or yogurt sweetened with artificial sweeteners.  8 oz (240 mL) milk.  8 oz (170 g) fresh fruit or one small piece of fruit.  24 oz (680 g) popped popcorn. Example of carbohydrate counting Sample meal  3 oz (85 g) chicken breast.  6 oz (170 g)  brown rice.  4 oz (113 g) corn.  8 oz (240 mL) milk.  8 oz (170 g) strawberries with sugar-free whipped topping. Carbohydrate calculation 1. Identify the foods that contain carbohydrates: ? Rice. ? Corn. ? Milk. ? Strawberries. 2. Calculate how many servings you have of each food: ? 2 servings rice. ? 1 serving corn. ? 1 serving milk. ? 1 serving strawberries. 3. Multiply each number of servings by 15 g: ? 2 servings rice x 15 g = 30 g. ? 1 serving corn x 15 g = 15 g. ? 1 serving milk x 15 g = 15 g. ? 1  serving strawberries x 15 g = 15 g. 4. Add together all of the amounts to find the total grams of carbohydrates eaten: ? 30 g + 15 g + 15 g + 15 g = 75 g of carbohydrates total. Summary  Carbohydrate counting is a method of keeping track of how many carbohydrates you eat.  Eating carbohydrates naturally increases the amount of sugar (glucose) in the blood.  Counting how many carbohydrates you eat helps keep your blood glucose within normal limits, which helps you manage your diabetes.  A diet and nutrition specialist (registered dietitian) can help you make a meal plan and calculate how many carbohydrates you should have at each meal and snack. This information is not intended to replace advice given to you by your health care provider. Make sure you discuss any questions you have with your health care provider. Document Released: 11/26/2005 Document Revised: 06/05/2017 Document Reviewed: 05/09/2016 Elsevier Interactive Patient Education  2019 [place] Maintenance, Female Adopting a healthy lifestyle and getting preventive care can go a long way to promote health and wellness. Talk with your health care provider about what schedule of regular examinations is right for you. This is a good chance for you to check in with your provider about disease prevention and staying healthy. In between checkups, there are plenty of things you can do on your own. Experts have done a lot of research about which lifestyle changes and preventive measures are most likely to keep you healthy. Ask your health care provider for more information. Weight and diet Eat a healthy diet  Be sure to include plenty of vegetables, fruits, low-fat dairy products, and lean protein.  Do not eat a lot of foods high in solid fats, added sugars, or salt.  Get regular exercise. This is one of the most important things you can do for your health. ? Most adults should exercise for at least 150 minutes each week. The  exercise should increase your heart rate and make you sweat (moderate-intensity exercise). ? Most adults should also do strengthening exercises at least twice a week. This is in addition to the moderate-intensity exercise. Maintain a healthy weight  Body mass index (BMI) is a measurement that can be used to identify possible weight problems. It estimates body fat based on height and weight. Your health care provider can help determine your BMI and help you achieve or maintain a healthy weight.  For females 80 years of age and older: ? A BMI below 18.5 is considered underweight. ? A BMI of 18.5 to 24.9 is normal. ? A BMI of 25 to 29.9 is considered overweight. ? A BMI of 30 and above is considered obese. Watch levels of cholesterol and blood lipids  You should start having your blood tested for lipids and cholesterol at 44 years of age, then have this test every 5  years.  You may need to have your cholesterol levels checked more often if: ? Your lipid or cholesterol levels are high. ? You are older than 44 years of age. ? You are at high risk for heart disease. Cancer screening Lung Cancer  Lung cancer screening is recommended for adults 30-13 years old who are at high risk for lung cancer because of a history of smoking.  A yearly low-dose CT scan of the lungs is recommended for people who: ? Currently smoke. ? Have quit within the past 15 years. ? Have at least a 30-pack-year history of smoking. A pack year is smoking an average of one pack of cigarettes a day for 1 year.  Yearly screening should continue until it has been 15 years since you quit.  Yearly screening should stop if you develop a health problem that would prevent you from having lung cancer treatment. Breast Cancer  Practice breast self-awareness. This means understanding how your breasts normally appear and feel.  It also means doing regular breast self-exams. Let your health care provider know about any changes,  no matter how small.  If you are in your 20s or 30s, you should have a clinical breast exam (CBE) by a health care provider every 1-3 years as part of a regular health exam.  If you are 73 or older, have a CBE every year. Also consider having a breast X-ray (mammogram) every year.  If you have a family history of breast cancer, talk to your health care provider about genetic screening.  If you are at high risk for breast cancer, talk to your health care provider about having an MRI and a mammogram every year.  Breast cancer gene (BRCA) assessment is recommended for women who have family members with BRCA-related cancers. BRCA-related cancers include: ? Breast. ? Ovarian. ? Tubal. ? Peritoneal cancers.  Results of the assessment will determine the need for genetic counseling and BRCA1 and BRCA2 testing. Cervical Cancer Your health care provider may recommend that you be screened regularly for cancer of the pelvic organs (ovaries, uterus, and vagina). This screening involves a pelvic examination, including checking for microscopic changes to the surface of your cervix (Pap test). You may be encouraged to have this screening done every 3 years, beginning at age 51.  For women ages 31-65, health care providers may recommend pelvic exams and Pap testing every 3 years, or they may recommend the Pap and pelvic exam, combined with testing for human papilloma virus (HPV), every 5 years. Some types of HPV increase your risk of cervical cancer. Testing for HPV may also be done on women of any age with unclear Pap test results.  Other health care providers may not recommend any screening for nonpregnant women who are considered low risk for pelvic cancer and who do not have symptoms. Ask your health care provider if a screening pelvic exam is right for you.  If you have had past treatment for cervical cancer or a condition that could lead to cancer, you need Pap tests and screening for cancer for at  least 20 years after your treatment. If Pap tests have been discontinued, your risk factors (such as having a new sexual partner) need to be reassessed to determine if screening should resume. Some women have medical problems that increase the chance of getting cervical cancer. In these cases, your health care provider may recommend more frequent screening and Pap tests. Colorectal Cancer  This type of cancer can be detected and often  prevented.  Routine colorectal cancer screening usually begins at 44 years of age and continues through 44 years of age.  Your health care provider may recommend screening at an earlier age if you have risk factors for colon cancer.  Your health care provider may also recommend using home test kits to check for hidden blood in the stool.  A small camera at the end of a tube can be used to examine your colon directly (sigmoidoscopy or colonoscopy). This is done to check for the earliest forms of colorectal cancer.  Routine screening usually begins at age 67.  Direct examination of the colon should be repeated every 5-10 years through 44 years of age. However, you may need to be screened more often if early forms of precancerous polyps or small growths are found. Skin Cancer  Check your skin from head to toe regularly.  Tell your health care provider about any new moles or changes in moles, especially if there is a change in a mole's shape or color.  Also tell your health care provider if you have a mole that is larger than the size of a pencil eraser.  Always use sunscreen. Apply sunscreen liberally and repeatedly throughout the day.  Protect yourself by wearing long sleeves, pants, a wide-brimmed hat, and sunglasses whenever you are outside. Heart disease, diabetes, and high blood pressure  High blood pressure causes heart disease and increases the risk of stroke. High blood pressure is more likely to develop in: ? People who have blood pressure in the  high end of the normal range (130-139/85-89 mm Hg). ? People who are overweight or obese. ? People who are African American.  If you are 27-33 years of age, have your blood pressure checked every 3-5 years. If you are 16 years of age or older, have your blood pressure checked every year. You should have your blood pressure measured twice-once when you are at a hospital or clinic, and once when you are not at a hospital or clinic. Record the average of the two measurements. To check your blood pressure when you are not at a hospital or clinic, you can use: ? An automated blood pressure machine at a pharmacy. ? A home blood pressure monitor.  If you are between 54 years and 65 years old, ask your health care provider if you should take aspirin to prevent strokes.  Have regular diabetes screenings. This involves taking a blood sample to check your fasting blood sugar level. ? If you are at a normal weight and have a low risk for diabetes, have this test once every three years after 44 years of age. ? If you are overweight and have a high risk for diabetes, consider being tested at a younger age or more often. Preventing infection Hepatitis B  If you have a higher risk for hepatitis B, you should be screened for this virus. You are considered at high risk for hepatitis B if: ? You were born in a country where hepatitis B is common. Ask your health care provider which countries are considered high risk. ? Your parents were born in a high-risk country, and you have not been immunized against hepatitis B (hepatitis B vaccine). ? You have HIV or AIDS. ? You use needles to inject street drugs. ? You live with someone who has hepatitis B. ? You have had sex with someone who has hepatitis B. ? You get hemodialysis treatment. ? You take certain medicines for conditions, including cancer, organ transplantation,  and autoimmune conditions. Hepatitis C  Blood testing is recommended for: ? Everyone born  from [birth year] through 1967. ? Anyone with known risk factors for hepatitis C. Sexually transmitted infections (STIs)  You should be screened for sexually transmitted infections (STIs) including gonorrhea and chlamydia if: ? You are sexually active and are younger than 44 years of age. ? You are older than 44 years of age and your health care provider tells you that you are at risk for this type of infection. ? Your sexual activity has changed since you were last screened and you are at an increased risk for chlamydia or gonorrhea. Ask your health care provider if you are at risk.  If you do not have HIV, but are at risk, it may be recommended that you take a prescription medicine daily to prevent HIV infection. This is called pre-exposure prophylaxis (PrEP). You are considered at risk if: ? You are sexually active and do not regularly use condoms or know the HIV status of your partner(s). ? You take drugs by injection. ? You are sexually active with a partner who has HIV. Talk with your health care provider about whether you are at high risk of being infected with HIV. If you choose to begin PrEP, you should first be tested for HIV. You should then be tested every 3 months for as long as you are taking PrEP. Pregnancy  If you are premenopausal and you may become pregnant, ask your health care provider about preconception counseling.  If you may become pregnant, take 400 to 800 micrograms (mcg) of folic acid every day.  If you want to prevent pregnancy, talk to your health care provider about birth control (contraception). Osteoporosis and menopause  Osteoporosis is a disease in which the bones lose minerals and strength with aging. This can result in serious bone fractures. Your risk for osteoporosis can be identified using a bone density scan.  If you are 19 years of age or older, or if you are at risk for osteoporosis and fractures, ask your health care provider if you should be  screened.  Ask your health care provider whether you should take a calcium or vitamin D supplement to lower your risk for osteoporosis.  Menopause may have certain physical symptoms and risks.  Hormone replacement therapy may reduce some of these symptoms and risks. Talk to your health care provider about whether hormone replacement therapy is right for you. Follow these instructions at home:  Schedule regular health, dental, and eye exams.  Stay current with your immunizations.  Do not use any tobacco products including cigarettes, chewing tobacco, or electronic cigarettes.  If you are pregnant, do not drink alcohol.  If you are breastfeeding, limit how much and how often you drink alcohol.  Limit alcohol intake to no more than 1 drink per day for nonpregnant women. One drink equals 12 ounces of beer, 5 ounces of wine, or 1 ounces of hard liquor.  Do not use street drugs.  Do not share needles.  Ask your health care provider for help if you need support or information about quitting drugs.  Tell your health care provider if you often feel depressed.  Tell your health care provider if you have ever been abused or do not feel safe at home. This information is not intended to replace advice given to you by your health care provider. Make sure you discuss any questions you have with your health care provider. Document Released: 06/11/2011 Document Revised: 05/03/2016 Document  Reviewed: 08/30/2015 Elsevier Interactive Patient Education  [employer].

## 2018-12-08 NOTE — Progress Notes (Signed)
Donna Matthews 1976-09-21 [id number]    History:    Presents for annual exam.  Regular monthly cycle using no contraception.  Myomectomy 01/2018 and has had good relief of menorrhagia.  Desires pregnancy but currently not sexually active.  10/2016 left breast cancer BRCA negative lumpectomy and radiation, Dr. Lindi Adie managing.  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  Past medical history, past surgical history, family history and social history were all reviewed and documented in the EPIC chart.  Works for [employer].  Mother, sister diabetes and hypertension.  Father deceased from MI at age 5.  ROS:  A ROS was performed and pertinent positives and negatives are included.  Exam:  Vitals:   12/08/18 0902  BP: 124/80  Weight: 191 lb (86.6 kg)  Height: 5' 4"  (1.626 m)   Body mass index is 32.79 kg/m.   General appearance:  Normal Thyroid:  Symmetrical, normal in size, without palpable masses or nodularity. Respiratory  Auscultation:  Clear without wheezing or rhonchi Cardiovascular  Auscultation:  Regular rate, without rubs, murmurs or gallops  Edema/varicosities:  Not grossly evident Abdominal  Soft,nontender, without masses, guarding or rebound.  Liver/spleen:  No organomegaly noted  Hernia:  None appreciated  Skin  Inspection:  Grossly normal   Breasts: Examined lying and sitting.     Right: Without masses, retractions, discharge or axillary adenopathy.     Left: Without masses, retractions, discharge or axillary adenopathy. Gentitourinary   Inguinal/mons:  Normal without inguinal adenopathy  External genitalia:  Normal  BUS/Urethra/Skene's glands:  Normal  Vagina:  Normal  Cervix:  Normal  Uterus: 8 weeks size/fibroids, shape and contour.  Midline and mobile  Adnexa/parametria:     Rt: Without masses or tenderness.   Lt: Without masses or tenderness.  Anus and perineum: Normal  Digital rectal exam: Normal sphincter tone without palpated masses or tenderness  Assessment/Plan:   42 y.o. SBF G2, P0 (ectopic and SAB) for annual exam with no complaints.  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  MVI daily, return to office with missed cycle, strongly encouraged condoms until permanent partner.  SBEs, continue annual screening mammogram, calcium rich foods, and decreasing calorie/carbs encouraged.  Pap normal 2018, GC/chlamydia, HIV, hep B, C, RPR.  Having no symptoms but did request STD screen.    Hooper, 9:55 AM 12/08/2018

## 2018-12-09 LAB — C. TRACHOMATIS/N. GONORRHOEAE RNA
C. trachomatis RNA, TMA: NOT DETECTED
N. gonorrhoeae RNA, TMA: NOT DETECTED

## 2018-12-09 LAB — HIV ANTIBODY (ROUTINE TESTING W REFLEX): HIV: NONREACTIVE

## 2018-12-09 LAB — HEPATITIS C ANTIBODY
HEP C AB: NONREACTIVE
SIGNAL TO CUT-OFF: 0.03 (ref <1.00)

## 2018-12-09 LAB — RPR: RPR: NONREACTIVE

## 2018-12-09 LAB — HEPATITIS B SURFACE ANTIGEN: HEP B S AG: NONREACTIVE

## 2018-12-10 LAB — URINALYSIS, COMPLETE W/RFL CULTURE
Bacteria, UA: NONE SEEN /HPF
Bilirubin Urine: NEGATIVE
GLUCOSE, UA: NEGATIVE
Hyaline Cast: NONE SEEN /LPF
Ketones, ur: NEGATIVE
LEUKOCYTE ESTERASE: NEGATIVE
NITRITES URINE, INITIAL: NEGATIVE
PH: 5.5 (ref 5.0–8.0)
Protein, ur: NEGATIVE
Specific Gravity, Urine: 1.02 (ref 1.001–1.03)
WBC, UA: NONE SEEN /HPF (ref 0–5)

## 2018-12-10 LAB — URINE CULTURE
MICRO NUMBER: 91556220
RESULT: NO GROWTH
SPECIMEN QUALITY:: ADEQUATE

## 2018-12-10 LAB — CULTURE INDICATED

## 2019-01-08 ENCOUNTER — Telehealth: Payer: Self-pay | Admitting: Hematology and Oncology

## 2019-01-08 NOTE — Telephone Encounter (Signed)
VG PAL 2/3 - moved f/u to 2/17. Spoke with patient.

## 2019-01-12 ENCOUNTER — Ambulatory Visit: Payer: 59 | Admitting: Hematology and Oncology

## 2019-01-26 ENCOUNTER — Ambulatory Visit (INDEPENDENT_AMBULATORY_CARE_PROVIDER_SITE_OTHER): Payer: 59 | Admitting: Podiatry

## 2019-01-26 ENCOUNTER — Telehealth: Payer: Self-pay | Admitting: Hematology and Oncology

## 2019-01-26 ENCOUNTER — Inpatient Hospital Stay: Payer: 59 | Attending: Hematology and Oncology | Admitting: Hematology and Oncology

## 2019-01-26 ENCOUNTER — Encounter: Payer: Self-pay | Admitting: Podiatry

## 2019-01-26 VITALS — BP 129/74

## 2019-01-26 DIAGNOSIS — Z9012 Acquired absence of left breast and nipple: Secondary | ICD-10-CM | POA: Diagnosis not present

## 2019-01-26 DIAGNOSIS — C50212 Malignant neoplasm of upper-inner quadrant of left female breast: Secondary | ICD-10-CM

## 2019-01-26 DIAGNOSIS — D0512 Intraductal carcinoma in situ of left breast: Secondary | ICD-10-CM | POA: Diagnosis present

## 2019-01-26 DIAGNOSIS — L600 Ingrowing nail: Secondary | ICD-10-CM

## 2019-01-26 DIAGNOSIS — N951 Menopausal and female climacteric states: Secondary | ICD-10-CM | POA: Diagnosis not present

## 2019-01-26 MED ORDER — TAMOXIFEN CITRATE 20 MG PO TABS: 20.0000 mg | ORAL_TABLET | Freq: Every day | ORAL | 3 refills | Status: DC

## 2019-01-26 NOTE — Progress Notes (Signed)
Patient Care Team: [name], PA as PCP - General (Family Medicine) [name], MD as Consulting Physician (General Surgery) Nicholas Lose, MD as Consulting Physician (Hematology and Oncology) Kyung Rudd, MD as Consulting Physician (Radiation Oncology) Delice Bison Charlestine Massed, NP as Nurse Practitioner (Hematology and Oncology)  DIAGNOSIS:  Encounter Diagnosis  Name Primary?  . 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  The Hereditary Gene Panel offered by [facility] includes sequencing and/or deletion duplication testing of the following 43 genes: APC, ATM, AXIN2, BARD1, BMPR1A, BRCA1, BRCA2, BRIP1, CDH1, CDKN2A (p14ARF), CDKN2A (p16INK4a), CHEK2, DICER1, EPCAM (Deletion/duplication testing only), GREM1 (promoter region deletion/duplication testing only), KIT, MEN1, MLH1, MSH2, MSH6, MUTYH, NBN, NF1, PALB2, PDGFRA, PMS2, POLD1, POLE, PTEN, RAD50, RAD51C, RAD51D, SDHB, SDHC, SDHD, SMAD4, SMARCA4. STK11, TP53, TSC1, TSC2, and VHL.  The following gene was evaluated for sequence changes only: SDHA and HOXB13 c.251G>A.  The report date is December 02, 2016.    12/18/2016 Surgery    Left Lumpectomy: HG DCIS 1.2 cm, Margins Neg, 0/7 LN Neg; TisN0 (Stage 0)    01/21/2017 - 03/06/2017 Radiation Therapy    Adjuvant radiation therapy [facility]): 1) Left Breast: 50.4 Gy in 28 fractions.  2) Left Breast Boost: 10 Gy in 5 fractions.    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  She is tolerating tamoxifen fairly well.  She continues to have hot flashes which seem to have gotten slightly better than before.  She denies any arthralgias or myalgias.  She denies any lumps or nodules in the breast.  She had a myomectomy for fibroids and since then she has not had any problems with the uterus.  REVIEW OF SYSTEMS:   Constitutional: Denies fevers, chills or abnormal weight loss Eyes: Denies blurriness of vision Ears, nose, mouth, throat, and face: Denies mucositis or sore throat Respiratory: Denies cough, dyspnea or wheezes Cardiovascular: Denies palpitation, chest discomfort Gastrointestinal:  Denies nausea, heartburn or change in bowel habits Skin: Denies abnormal skin rashes Lymphatics: Denies new lymphadenopathy or easy bruising Neurological:Denies numbness, tingling or new weaknesses Behavioral/Psych: Mood is stable, no new changes  Extremities: No lower extremity edema Breast:  denies any pain or lumps or nodules in either breasts All other systems were reviewed with the patient and are negative.  I have reviewed the past medical history, past surgical history, social history and family history with the patient and they are unchanged from previous note.  ALLERGIES:  is allergic to bee venom and penicillins.  MEDICATIONS:  Current Outpatient Medications  Medication Sig Dispense Refill  . acetaminophen (TYLENOL) 500 MG tablet Take 500-1,000 mg by mouth every 6 (six) hours as needed (FOR PAIN).    Marland Kitchen Biotin 1 MG CAPS Take  1 mg by mouth daily.    . Cholecalciferol (VITAMIN D3) 5000 units CAPS Take 5,000 Units by mouth daily.    . Ferrous Sulfate (IRON) 325 (65 Fe) MG TABS Take 1 tablet by mouth 3 (three)  times daily. 30 each 0  . ibuprofen (ADVIL,MOTRIN) 800 MG tablet Take 1 tablet (800 mg total) by mouth every 8 (eight) hours. 60 tablet 0  . Multiple Vitamins-Minerals (ADULT GUMMY PO) Take 2 tablets by mouth daily.    Marland Kitchen nystatin cream (MYCOSTATIN) Apply 1 application topically 2 (two) times daily. 30 g 3  . tamoxifen (NOLVADEX) 20 MG tablet Take 1 tablet (20 mg total) by mouth daily at 2 PM. 90 tablet 3   No current facility-administered medications for this visit.     PHYSICAL EXAMINATION: ECOG PERFORMANCE STATUS: 1 - Symptomatic but completely ambulatory  Vitals:   01/26/19 0837  BP: (!) 143/84  Pulse: 83  Resp: 18  Temp: 99.4 F (37.4 C)  SpO2: 100%   Filed Weights   01/26/19 0837  Weight: 189 lb 1.6 oz (85.8 kg)    GENERAL:alert, no distress and comfortable SKIN: skin color, texture, turgor are normal, no rashes or significant lesions EYES: normal, Conjunctiva are pink and non-injected, sclera clear OROPHARYNX:no exudate, no erythema and lips, buccal mucosa, and tongue normal  NECK: supple, thyroid normal size, non-tender, without nodularity LYMPH:  no palpable lymphadenopathy in the cervical, axillary or inguinal LUNGS: clear to auscultation and percussion with normal breathing effort HEART: regular rate & rhythm and no murmurs and no lower extremity edema ABDOMEN:abdomen soft, non-tender and normal bowel sounds MUSCULOSKELETAL:no cyanosis of digits and no clubbing  NEURO: alert & oriented x 3 with fluent speech, no focal motor/sensory deficits EXTREMITIES: No lower extremity edema BREAST: Darkening of the skin of the left breast.  No palpable masses or nodules in either right or left breasts. No palpable axillary supraclavicular or infraclavicular adenopathy no breast tenderness or nipple discharge. (exam performed in the presence of a chaperone)  LABORATORY DATA:  I have reviewed the data as listed CMP Latest Ref Rng & Units 11/26/2017 11/22/2017 11/21/2017  Glucose  65 - 99 mg/dL 93 113(H) 117(H)  BUN 7 - 25 mg/dL 6(L) 10 13  Creatinine 0.50 - 1.10 mg/dL 0.59 0.51 0.49  Sodium 135 - 146 mmol/L 141 139 141  Potassium 3.5 - 5.3 mmol/L 4.1 3.3(L) 3.9  Chloride 98 - 110 mmol/L 109 109 112(H)  CO2 20 - 32 mmol/L 25 24 26   Calcium 8.6 - 10.2 mg/dL 8.7 7.4(L) 8.3(L)  Total Protein 6.1 - 8.1 g/dL 5.9(L) - 6.0(L)  Total Bilirubin 0.2 - 1.2 mg/dL 0.3 - 0.6  Alkaline Phos 38 - 126 U/L - - 37(L)  AST 10 - 30 U/L 13 - 17  ALT 6 - 29 U/L 12 - 14    Lab Results  Component Value Date   WBC 5.9 06/03/2018   HGB 12.0 06/03/2018   HCT 33.4 (L) 06/03/2018   MCV 76.4 (L) 06/03/2018   PLT 265 06/03/2018   NEUTROABS 3,280 06/03/2018    ASSESSMENT & PLAN:  Malignant neoplasm of upper-inner quadrant of left female breast ([place]) 12/18/16: 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  No further issues  Breast Cancer  Surveillance: 1. Breast exam 01/26/2019: Benign 2. Mammogram  10/29/2018: Benign, breast density category C  I encouraged her to exercise and participate in the live strong program and I provided her with the paperwork for that.  Return to clinic in 1 year for follow-up   No orders of the defined types were placed in this encounter.  The patient has a good understanding of the overall plan. she agrees with it. she will call with any problems that may develop before the next visit here.   Harriette Ohara, MD 01/26/19

## 2019-01-26 NOTE — Telephone Encounter (Signed)
Gave avs and calendar ° °

## 2019-01-26 NOTE — Assessment & Plan Note (Addendum)
12/18/16: Left Lumpectomy: HG DCIS 1.2 cm, Margins Neg, 0/7 LN Neg; TisN0 (Stage 1 A) ER 95%, PR 95% Adjuvant radiation therapy 01/21/2017- 03/06/2017  Current treatment: Antiestrogen therapy with tamoxifen 5 years, started 03/10/2017  TamoxifenToxicities: 1.Intermittent hot flashes 2.occasional muscle cramps 3.Heavy menstrual bleeding: Status post hysterectomy.  No further issues  Breast Cancer Surveillance: 1. Breast exam 01/26/2019: Benign 2. Mammogram  10/29/2018: Benign, breast density category C  Return to clinic in 1 year for follow-up

## 2019-01-28 NOTE — Progress Notes (Signed)
Subjective:   Patient ID: Donna Matthews, female   DOB: 43 y.o.   MRN: [id number]   HPI Patient is concerned about pain in her big toenails of both feet and slightly in the second nails of both feet.  States that it is localized to this area and that she is concerned about ingrown toenail or possible nail trauma fungus.  Patient does not smoke and likes to be active   Review of Systems  All other systems reviewed and are negative.       Objective:  Physical Exam Vitals signs and nursing note reviewed.  Constitutional:      Appearance: She is well-developed.  Pulmonary:     Effort: Pulmonary effort is normal.  Musculoskeletal: Normal range of motion.  Skin:    General: Skin is warm.  Neurological:     Mental Status: She is alert.     Neurovascular status intact muscle strength is adequate range of motion within normal limits with patient found to have mild incurvation of the hallux nails bilateral localized with slight yellow discoloration of the lateral border that is not involving the entire nail and is found to have mild thickness of the second nails bilateral.  Good digital perfusion well oriented x3     Assessment:  Appears to be more related to trauma than anything else and I conveyed the findings with the patient currently     Plan:  H&P conditions reviewed and do not recommend treatment unless symptoms were to get worse.  Patient will be seen back for [place] to recheck on an as-needed basis and is encouraged to call with questions concerns

## 2019-07-27 ENCOUNTER — Other Ambulatory Visit: Payer: Self-pay

## 2019-07-28 ENCOUNTER — Encounter: Payer: Self-pay | Admitting: Women's Health

## 2019-07-28 ENCOUNTER — Ambulatory Visit (INDEPENDENT_AMBULATORY_CARE_PROVIDER_SITE_OTHER): Payer: 59 | Admitting: Women's Health

## 2019-07-28 VITALS — BP 122/78 | Ht 64.0 in | Wt 179.8 lb

## 2019-07-28 DIAGNOSIS — N926 Irregular menstruation, unspecified: Secondary | ICD-10-CM

## 2019-07-28 DIAGNOSIS — R350 Frequency of micturition: Secondary | ICD-10-CM | POA: Diagnosis not present

## 2019-07-28 NOTE — Patient Instructions (Signed)
Metrorrhagia Metrorrhagia is bleeding from the uterus that happens irregularly but often. The bleeding generally happens between menstrual periods. Follow these instructions at home: Pay attention to any changes in your symptoms. Let your health care provider know about them. Follow these instructions to help with your condition: Eating and drinking   Eat well-balanced meals. Include foods that are high in iron, such as liver, meat, shellfish, green leafy vegetables, and eggs.  If you become constipated: ? Drink plenty of water. Drink enough to keep your urine pale yellow. ? Take over-the-counter or prescription medicines. ? Eat foods that are high in fiber, such as beans, whole grains, and fresh fruits and vegetables. ? Limit foods that are high in fat and processed sugars, such as fried or sweet foods. Medicines  Take over-the-counter and prescription medicines only as told by your health care provider.  Do not change medicines without talking with your health care provider.  Aspirin or medicines that contain aspirin may make the bleeding worse. Do not take these medicines: ? During your period. ? During the week before your period.  If you were prescribed iron pills, take them as told by your health care provider. Iron pills help to replace iron that your body loses because of this condition. Activity  If you need to change your sanitary pad or tampon more than one time every 2 hours: ? Lie in bed with your feet raised (elevated). ? Place a cold pack on your lower abdomen. ? Rest as much as possible until the bleeding stops or slows down. General instructions   For 2 months, write down: ? When your period starts. ? When your period ends. ? When any abnormal bleeding occurs. ? What problems you notice.  Keep all follow-up visits as told by your health care provider. This is important. Contact a health care provider if:  You get light-headed or weak.  You have nausea and  vomiting.  You cannot eat or drink without vomiting.  You feel dizzy or have diarrhea while you are taking medicine.  Have questions about birth control. Get help right away if:  You develop a fever or chills.  You need to change your sanitary pad or tampon more than one time per hour.  Your bleeding becomesheavy.  Your flow contains clots.  You develop pain in your abdomen.  You lose consciousness.  You develop a rash. Summary  Metrorrhagia is bleeding from the uterus that happens irregularly but often, usually between menstrual periods.  Pay attention to any changes in your symptoms. Let your health care provider know about them.  Eat well-balanced meals. Include foods that are high in iron, such as liver, meat, shellfish, green leafy vegetables, and eggs.  Get help right away if you develop a fever, you see clots in your blood, your bleeding becomes heavy, you develop a rash, or you lose consciousness. This information is not intended to replace advice given to you by your health care provider. Make sure you discuss any questions you have with your health care provider. Document Released: 11/26/2005 Document Revised: 05/29/2018 Document Reviewed: 05/29/2018 Elsevier Patient Education  2020 [employer].

## 2019-07-28 NOTE — Progress Notes (Signed)
43 year old SBF G2 P0 presents with complaint of irregular cycle.  Was having regular monthly cycles until July, July 29 has had some off-and-on spotting for 3 weeks some increased hot flushes and started having heavier bleeding today.  Not sexually active in many months.  10/2016 left breast cancer on tamoxifen, 01/2018 myomectomy and has had good relief of menorrhagia.  Reports some increased urinary frequency without pain or burning.  Denies vaginal discharge, abdominal/back pain, fever.  Exam: Appears well.  No CVAT.  External genitalia within normal limits, speculum exam moderate amount of menses type blood, no erythema noted, bimanual no CMT or adnexal tenderness. UA: +3 blood, trace ketones, negative nitrites, negative leukocytes, 0-5 WBCs, packed RBCs, 0-5 squamous epithelials, moderate bacteria  1 irregular cycle  Plan: Reviewed possible July cycle abnormal, August cycle starting.  Will watch at this time, if cycle lasts greater than 7 days instructed to call or if abnormal cycle occurs again we will proceed with sonohysterogram.  Reviewed may have some fluctuations in hormones causing some of the hot flushes and tamoxifen also can cause them.  Urine culture pending.

## 2019-07-31 LAB — URINALYSIS, COMPLETE W/RFL CULTURE
Bilirubin Urine: NEGATIVE
Glucose, UA: NEGATIVE
Hyaline Cast: NONE SEEN /LPF
Leukocyte Esterase: NEGATIVE
Nitrites, Initial: NEGATIVE
Specific Gravity, Urine: 1.025 (ref 1.001–1.03)
pH: 6.5 (ref 5.0–8.0)

## 2019-07-31 LAB — URINE CULTURE
MICRO NUMBER:: 792902
SPECIMEN QUALITY:: ADEQUATE

## 2019-07-31 LAB — CULTURE INDICATED

## 2019-08-02 ENCOUNTER — Other Ambulatory Visit: Payer: Self-pay | Admitting: Women's Health

## 2019-08-02 MED ORDER — NITROFURANTOIN MONOHYD MACRO 100 MG PO CAPS: 100.0000 mg | ORAL_CAPSULE | Freq: Two times a day (BID) | ORAL | 0 refills | Status: DC

## 2019-08-11 ENCOUNTER — Other Ambulatory Visit: Payer: Self-pay | Admitting: Hematology and Oncology

## 2019-08-11 DIAGNOSIS — Z9889 Other specified postprocedural states: Secondary | ICD-10-CM

## 2019-09-01 ENCOUNTER — Encounter: Payer: Self-pay | Admitting: Gynecology

## 2019-10-12 ENCOUNTER — Other Ambulatory Visit: Payer: Self-pay

## 2019-10-13 ENCOUNTER — Ambulatory Visit (INDEPENDENT_AMBULATORY_CARE_PROVIDER_SITE_OTHER): Payer: 59 | Admitting: Women's Health

## 2019-10-13 ENCOUNTER — Encounter: Payer: Self-pay | Admitting: Women's Health

## 2019-10-13 DIAGNOSIS — N898 Other specified noninflammatory disorders of vagina: Secondary | ICD-10-CM | POA: Diagnosis not present

## 2019-10-13 DIAGNOSIS — R350 Frequency of micturition: Secondary | ICD-10-CM | POA: Diagnosis not present

## 2019-10-13 LAB — WET PREP FOR TRICH, YEAST, CLUE

## 2019-10-13 NOTE — Progress Notes (Signed)
43 year old S BF G2 P0 presents with complaint of vaginal irritation for the past week.  Denies vaginal discharge, odor, itching, mostly external vaginal soreness.  Denies urinary symptoms, abdominal/back pain or fever.  Has had some irregular cycles in the past few months.  July cycle was mostly spotting, normal cycle in September, October cycle was last week for 3 days, stopped for 3 days and is now bleeding again.  01/2018  Myomectomy which helped menorrhagia.  10/2016 left breast cancer on tamoxifen.  Not sexually active denies need for STD screen.  Exam: Appears well.  Abdomen soft, nontender, external genitalia within normal limits, erythemic at introitus, no visible lesions, blisters, speculum exam moderate amount of menses, no erythema or odor noted, bimanual no CMT or tenderness with exam, wet prep negative. UA: +3 blood, +1 leukocytes, 6-10 WBCs, 40-60 RBCs, 6-10 squamous epithelials, few bacteria  Vaginal irritation  Plan: Reviewed irritation may be from pads, apply A&D over-the-counter ointment, loose clothing, instructed to call if bleeding persists.  Urine culture pending.  Reviewed urine most likely contaminated from menses.

## 2019-10-13 NOTE — Patient Instructions (Signed)
A&D otc cream  Vaginitis Vaginitis is a condition in which the vaginal tissue swells and becomes red (inflamed). This condition is most often caused by a change in the normal balance of bacteria and yeast that live in the vagina. This change causes an overgrowth of certain bacteria or yeast, which causes the inflammation. There are different types of vaginitis, but the most common types are:  Bacterial vaginosis.  Yeast infection (candidiasis).  Trichomoniasis vaginitis. This is a sexually transmitted disease (STD).  Viral vaginitis.  Atrophic vaginitis.  Allergic vaginitis. What are the causes? The cause of this condition depends on the type of vaginitis. It can be caused by:  Bacteria (bacterial vaginosis).  Yeast, which is a fungus (yeast infection).  A parasite (trichomoniasis vaginitis).  A virus (viral vaginitis).  Low hormone levels (atrophic vaginitis). Low hormone levels can occur during pregnancy, breastfeeding, or after menopause.  Irritants, such as bubble baths, scented tampons, and feminine sprays (allergic vaginitis). Other factors can change the normal balance of the yeast and bacteria that live in the vagina. These include:  Antibiotic medicines.  Poor hygiene.  Diaphragms, vaginal sponges, spermicides, birth control pills, and intrauterine devices (IUD).  Sex.  Infection.  Uncontrolled diabetes.  A weakened defense (immune) system. What increases the risk? This condition is more likely to develop in women who:  Smoke.  Use vaginal douches, scented tampons, or scented sanitary pads.  Wear tight-fitting pants.  Wear thong underwear.  Use oral birth control pills or an IUD.  Have sex without a condom.  Have multiple sex partners.  Have an STD.  Frequently use the spermicide nonoxynol-9.  Eat lots of foods high in sugar.  Have uncontrolled diabetes.  Have low estrogen levels.  Have a weakened immune system from an immune disorder or  medical treatment.  Are pregnant or breastfeeding. What are the signs or symptoms? Symptoms vary depending on the cause of the vaginitis. Common symptoms include:  Abnormal vaginal discharge. ? The discharge is white, gray, or yellow with bacterial vaginosis. ? The discharge is thick, white, and cheesy with a yeast infection. ? The discharge is frothy and yellow or greenish with trichomoniasis.  A bad vaginal smell. The smell is fishy with bacterial vaginosis.  Vaginal itching, pain, or swelling.  Sex that is painful.  Pain or burning when urinating. Sometimes there are no symptoms. How is this diagnosed? This condition is diagnosed based on your symptoms and medical history. A physical exam, including a pelvic exam, will also be done. You may also have other tests, including:  Tests to determine the pH level (acidity or alkalinity) of your vagina.  A whiff test, to assess the odor that results when a sample of your vaginal discharge is mixed with a potassium hydroxide solution.  Tests of vaginal fluid. A sample will be examined under a microscope. How is this treated? Treatment varies depending on the type of vaginitis you have. Your treatment may include:  Antibiotic creams or pills to treat bacterial vaginosis and trichomoniasis.  Antifungal medicines, such as vaginal creams or suppositories, to treat a yeast infection.  Medicine to ease discomfort if you have viral vaginitis. Your sexual partner should also be treated.  Estrogen delivered in a cream, pill, suppository, or vaginal ring to treat atrophic vaginitis. If vaginal dryness occurs, lubricants and moisturizing creams may help. You may need to avoid scented soaps, sprays, or douches.  Stopping use of a product that is causing allergic vaginitis. Then using a vaginal cream to  treat the symptoms. Follow these instructions at home: Lifestyle  Keep your genital area clean and dry. Avoid soap, and only rinse the area  with water.  Do not douche or use tampons until your health care provider says it is okay to do so. Use sanitary pads, if needed.  Do not have sex until your health care provider approves. When you can return to sex, practice safe sex and use condoms.  Wipe from front to back. This avoids the spread of bacteria from the rectum to the vagina. General instructions  Take over-the-counter and prescription medicines only as told by your health care provider.  If you were prescribed an antibiotic medicine, take or use it as told by your health care provider. Do not stop taking or using the antibiotic even if you start to feel better.  Keep all follow-up visits as told by your health care provider. This is important. How is this prevented?  Use mild, non-scented products. Do not use things that can irritate the vagina, such as fabric softeners. Avoid the following products if they are scented: ? Feminine sprays. ? Detergents. ? Tampons. ? Feminine hygiene products. ? Soaps or bubble baths.  Let air reach your genital area. ? Wear cotton underwear to reduce moisture buildup. ? Avoid wearing underwear while you sleep. ? Avoid wearing tight pants and underwear or nylons without a cotton panel. ? Avoid wearing thong underwear.  Take off any wet clothing, such as bathing suits, as soon as possible.  Practice safe sex and use condoms. Contact a health care provider if:  You have abdominal pain.  You have a fever.  You have symptoms that last for more than 2-3 days. Get help right away if:  You have a fever and your symptoms suddenly get worse. Summary  Vaginitis is a condition in which the vaginal tissue becomes inflamed.This condition is most often caused by a change in the normal balance of bacteria and yeast that live in the vagina.  Treatment varies depending on the type of vaginitis you have.  Do not douche, use tampons , or have sex until your health care provider approves.  When you can return to sex, practice safe sex and use condoms. This information is not intended to replace advice given to you by your health care provider. Make sure you discuss any questions you have with your health care provider. Document Released: 09/23/2007 Document Revised: 11/08/2017 Document Reviewed: 01/01/2017 Elsevier Patient Education  2020 [employer].

## 2019-10-15 LAB — URINALYSIS, COMPLETE W/RFL CULTURE
Bilirubin Urine: NEGATIVE
Glucose, UA: NEGATIVE
Hyaline Cast: NONE SEEN /LPF
Ketones, ur: NEGATIVE
Nitrites, Initial: NEGATIVE
Protein, ur: NEGATIVE
Specific Gravity, Urine: 1.015 (ref 1.001–1.03)
pH: 7 (ref 5.0–8.0)

## 2019-10-15 LAB — URINE CULTURE
MICRO NUMBER:: 1059146
SPECIMEN QUALITY:: ADEQUATE

## 2019-10-15 LAB — CULTURE INDICATED

## 2019-11-02 ENCOUNTER — Ambulatory Visit
Admission: RE | Admit: 2019-11-02 | Discharge: 2019-11-02 | Disposition: A | Discharge status: Home / Self Care | Payer: 59 | Source: Ambulatory Visit | Attending: Hematology and Oncology | Admitting: Hematology and Oncology

## 2019-11-02 ENCOUNTER — Other Ambulatory Visit: Payer: Self-pay

## 2019-11-02 DIAGNOSIS — Z9889 Other specified postprocedural states: Secondary | ICD-10-CM

## 2019-12-10 ENCOUNTER — Other Ambulatory Visit: Payer: Self-pay

## 2019-12-14 ENCOUNTER — Other Ambulatory Visit: Payer: Self-pay

## 2019-12-14 ENCOUNTER — Ambulatory Visit (INDEPENDENT_AMBULATORY_CARE_PROVIDER_SITE_OTHER): Payer: No Typology Code available for payment source | Admitting: Women's Health

## 2019-12-14 ENCOUNTER — Encounter: Payer: Self-pay | Admitting: Women's Health

## 2019-12-14 VITALS — BP 130/80 | Ht 63.0 in | Wt 176.2 lb

## 2019-12-14 DIAGNOSIS — Z01419 Encounter for gynecological examination (general) (routine) without abnormal findings: Secondary | ICD-10-CM

## 2019-12-14 DIAGNOSIS — Z113 Encounter for screening for infections with a predominantly sexual mode of transmission: Secondary | ICD-10-CM

## 2019-12-14 DIAGNOSIS — N898 Other specified noninflammatory disorders of vagina: Secondary | ICD-10-CM | POA: Diagnosis not present

## 2019-12-14 LAB — WET PREP FOR TRICH, YEAST, CLUE

## 2019-12-14 MED ORDER — NYSTATIN-TRIAMCINOLONE 100000-0.1 UNIT/GM-% EX OINT: 1.0000 "application " | TOPICAL_OINTMENT | Freq: Two times a day (BID) | CUTANEOUS | 0 refills | Status: DC

## 2019-12-14 NOTE — Progress Notes (Signed)
Donna Matthews [birth year] [id number]    History:    Presents for annual exam.  Regular monthly 5 to 7-day cycle, first 3 days heavy changing protection every 1-2 hours.  12/2017 myomectomy and has had better cycle since.  1998 LGSIL with normal Paps thereafter.  10/2016 left breast cancer BRCA negative lumpectomy and radiation on tamoxifen.  History of infertility.  Hypertension primary care manages.  Past medical history, past surgical history, family history and social history were all reviewed and documented in the EPIC chart.  Works for [employer] from home. Mother, sister diabetes and hypertension.  Father deceased from MI at age 69.  ROS:  A ROS was performed and pertinent positives and negatives are included.  Exam:  Vitals:   12/14/19 1545  BP: 130/80  Weight: 176 lb 3.2 oz (79.9 kg)  Height: _0  (1.6 m)   Body mass index is 31.21 kg/m.   General appearance:  Normal Thyroid:  Symmetrical, normal in size, without palpable masses or nodularity. Respiratory  Auscultation:  Clear without wheezing or rhonchi Cardiovascular  Auscultation:  Regular rate, without rubs, murmurs or gallops  Edema/varicosities:  Not grossly evident Abdominal  Soft,nontender, without masses, guarding or rebound.  Liver/spleen:  No organomegaly noted  Hernia:  None appreciated  Skin  Inspection:  Grossly normal   Breasts: Examined lying and sitting.     Right: Without masses, retractions, discharge or axillary adenopathy.     Left: Without masses, retractions, discharge or axillary adenopathy. Gentitourinary   Inguinal/mons:  Normal without inguinal adenopathy  External genitalia:  Normal  BUS/Urethra/Skene's glands:  Normal  Vagina:  Normal  Cervix:  Normal  Uterus:   normal in size, shape and contour.  Midline and mobile  Adnexa/parametria:     Rt: Without masses or tenderness.   Lt: Without masses or tenderness.  Anus and perineum: Normal  Digital rectal exam: Normal sphincter tone without  palpated masses or tenderness  Assessment/Plan:  44 y.o. S BF G2, P0 for annual exam with complaint of occasional vaginal irritation.  Requesting STD screen.  Monthly 5 to 7-day cycles for 3 days menorrhagia  STD screen 12/2017 myomectomy 10/2016 left breast cancer BRCA negative on tamoxifen Dr. Lindi Adie manages.  Hypertension primary care manages labs and meds  Plan: Reviewed normality of wet prep and exam.  Mycolog prescription, use small amount externally as needed, loose clothing open to air when able.  SBEs, continue annual 3D screening mammogram, calcium rich foods, vitamin D 2000 daily, dark green leafy vegetables/iron rich foods encouraged.  Declines contraception, condoms encouraged, GC/chlamydia, HIV, RPR.  2018 Pap normal with negative HR HPV typing, new screening guidelines reviewed.    [place], 4:36 PM 12/14/2019

## 2019-12-14 NOTE — Patient Instructions (Addendum)
Good to see you today! MVI today  Health Maintenance, Female Adopting a healthy lifestyle and getting preventive care are important in promoting health and wellness. Ask your health care provider about:  The right schedule for you to have regular tests and exams.  Things you can do on your own to prevent diseases and keep yourself healthy. What should I know about diet, weight, and exercise? Eat a healthy diet   Eat a diet that includes plenty of vegetables, fruits, low-fat dairy products, and lean protein.  Do not eat a lot of foods that are high in solid fats, added sugars, or sodium. Maintain a healthy weight Body mass index (BMI) is used to identify weight problems. It estimates body fat based on height and weight. Your health care provider can help determine your BMI and help you achieve or maintain a healthy weight. Get regular exercise Get regular exercise. This is one of the most important things you can do for your health. Most adults should:  Exercise for at least 150 minutes each week. The exercise should increase your heart rate and make you sweat (moderate-intensity exercise).  Do strengthening exercises at least twice a week. This is in addition to the moderate-intensity exercise.  Spend less time sitting. Even light physical activity can be beneficial. Watch cholesterol and blood lipids Have your blood tested for lipids and cholesterol at 46 years of age, then have this test every 5 years. Have your cholesterol levels checked more often if:  Your lipid or cholesterol levels are high.  You are older than 46 years of age.  You are at high risk for heart disease. What should I know about cancer screening? Depending on your health history and family history, you may need to have cancer screening at various ages. This may include screening for:  Breast cancer.  Cervical cancer.  Colorectal cancer.  Skin cancer.  Lung cancer. What should I know about heart  disease, diabetes, and high blood pressure? Blood pressure and heart disease  High blood pressure causes heart disease and increases the risk of stroke. This is more likely to develop in people who have high blood pressure readings, are of African descent, or are overweight.  Have your blood pressure checked: ? Every 3-5 years if you are 23-83 years of age. ? Every year if you are 71 years old or older. Diabetes Have regular diabetes screenings. This checks your fasting blood sugar level. Have the screening done:  Once every three years after age 53 if you are at a normal weight and have a low risk for diabetes.  More often and at a younger age if you are overweight or have a high risk for diabetes. What should I know about preventing infection? Hepatitis B If you have a higher risk for hepatitis B, you should be screened for this virus. Talk with your health care provider to find out if you are at risk for hepatitis B infection. Hepatitis C Testing is recommended for:  Everyone born from [birth year] through 1965.  Anyone with known risk factors for hepatitis C. Sexually transmitted infections (STIs)  Get screened for STIs, including gonorrhea and chlamydia, if: ? You are sexually active and are younger than 46 years of age. ? You are older than 46 years of age and your health care provider tells you that you are at risk for this type of infection. ? Your sexual activity has changed since you were last screened, and you are at increased risk for chlamydia  or gonorrhea. Ask your health care provider if you are at risk.  Ask your health care provider about whether you are at high risk for HIV. Your health care provider may recommend a prescription medicine to help prevent HIV infection. If you choose to take medicine to prevent HIV, you should first get tested for HIV. You should then be tested every 3 months for as long as you are taking the medicine. Pregnancy  If you are about to stop  having your period (premenopausal) and you may become pregnant, seek counseling before you get pregnant.  Take 400 to 800 micrograms (mcg) of folic acid every day if you become pregnant.  Ask for birth control (contraception) if you want to prevent pregnancy. Osteoporosis and menopause Osteoporosis is a disease in which the bones lose minerals and strength with aging. This can result in bone fractures. If you are 37 years old or older, or if you are at risk for osteoporosis and fractures, ask your health care provider if you should:  Be screened for bone loss.  Take a calcium or vitamin D supplement to lower your risk of fractures.  Be given hormone replacement therapy (HRT) to treat symptoms of menopause. Follow these instructions at home: Lifestyle  Do not use any products that contain nicotine or tobacco, such as cigarettes, e-cigarettes, and chewing tobacco. If you need help quitting, ask your health care provider.  Do not use street drugs.  Do not share needles.  Ask your health care provider for help if you need support or information about quitting drugs. Alcohol use  Do not drink alcohol if: ? Your health care provider tells you not to drink. ? You are pregnant, may be pregnant, or are planning to become pregnant.  If you drink alcohol: ? Limit how much you use to 0-1 drink a day. ? Limit intake if you are breastfeeding.  Be aware of how much alcohol is in your drink. In the [place] one drink equals one 12 oz bottle of beer (355 mL), one 5 oz glass of wine (148 mL), or one 1 oz glass of hard liquor (44 mL). General instructions  Schedule regular health, dental, and eye exams.  Stay current with your vaccines.  Tell your health care provider if: ? You often feel depressed. ? You have ever been abused or do not feel safe at home. Summary  Adopting a healthy lifestyle and getting preventive care are important in promoting health and wellness.  Follow your health  care provider's instructions about healthy diet, exercising, and getting tested or screened for diseases.  Follow your health care provider's instructions on monitoring your cholesterol and blood pressure. This information is not intended to replace advice given to you by your health care provider. Make sure you discuss any questions you have with your health care provider. Document Revised: 11/19/2018 Document Reviewed: 11/19/2018 Elsevier Patient Education  2020 [employer].

## 2019-12-15 LAB — CHLAMYDIA PROBE AMP THINPREP: C. trachomatis RNA, TMA: NOT DETECTED

## 2019-12-15 LAB — GC PROBE AMP THINPREP: N. gonorrhoeae RNA, TMA: NOT DETECTED

## 2019-12-15 LAB — RPR: RPR Ser Ql: NONREACTIVE

## 2019-12-15 LAB — HIV ANTIBODY (ROUTINE TESTING W REFLEX): HIV 1&2 Ab, 4th Generation: NONREACTIVE

## 2020-01-02 IMAGING — MG DIGITAL DIAGNOSTIC BILAT W/ TOMO W/ CAD
6 of 9 series · 6 of 25 positions shown · non-contrast
Comparison: Previous exam(s).

CLINICAL DATA: History of left breast cancer in 3873 status post
lumpectomy and radiation. Annual exam.

EXAM:
DIGITAL DIAGNOSTIC BILATERAL MAMMOGRAM WITH CAD AND TOMO

[L MLO]
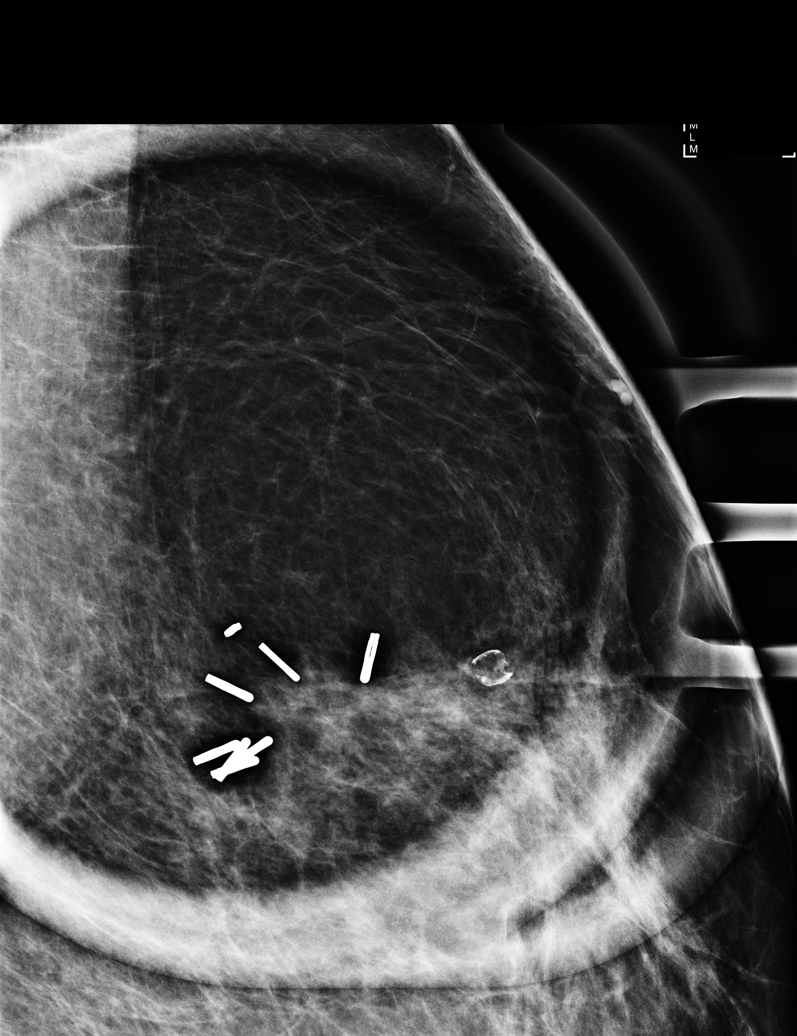

[L CC synth-2D]
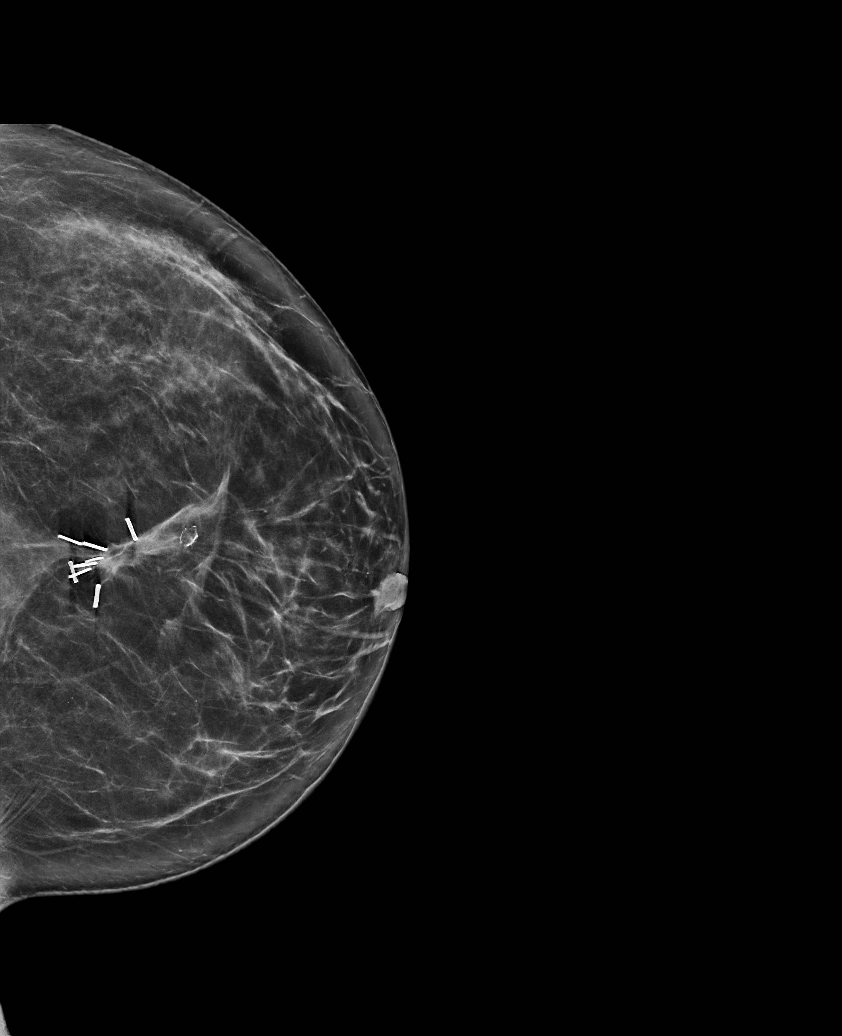

[L MLO synth-2D]
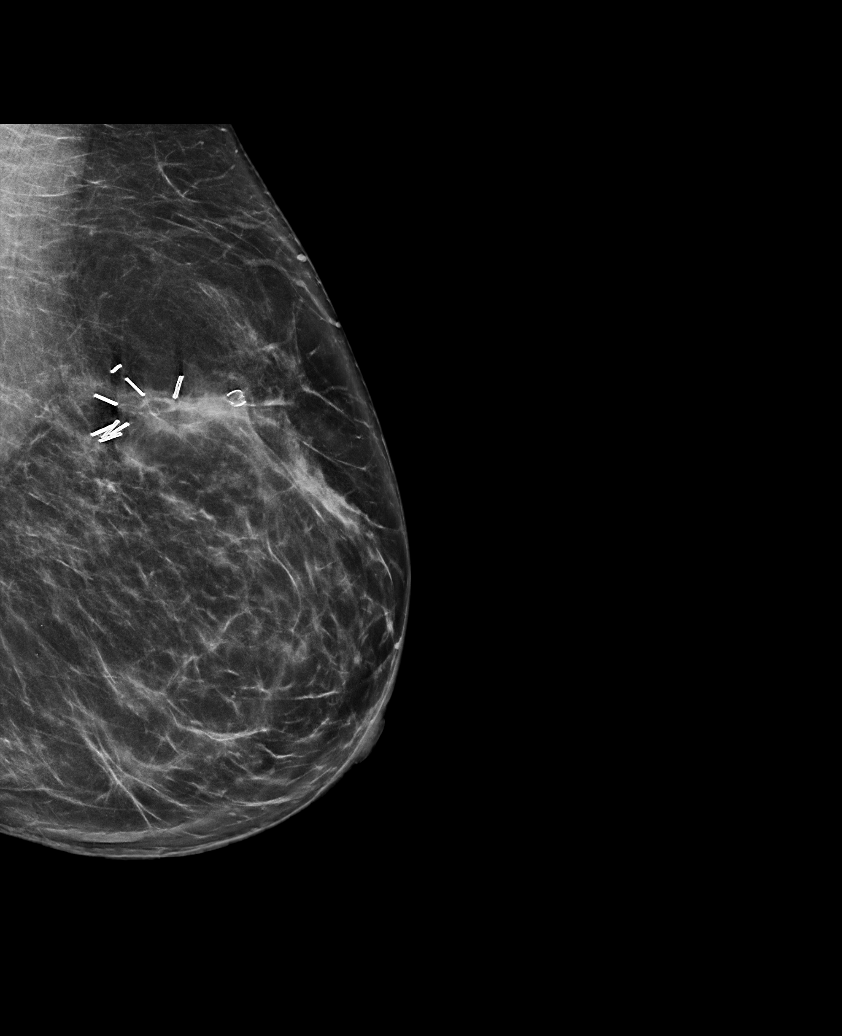

[R MLO synth-2D]
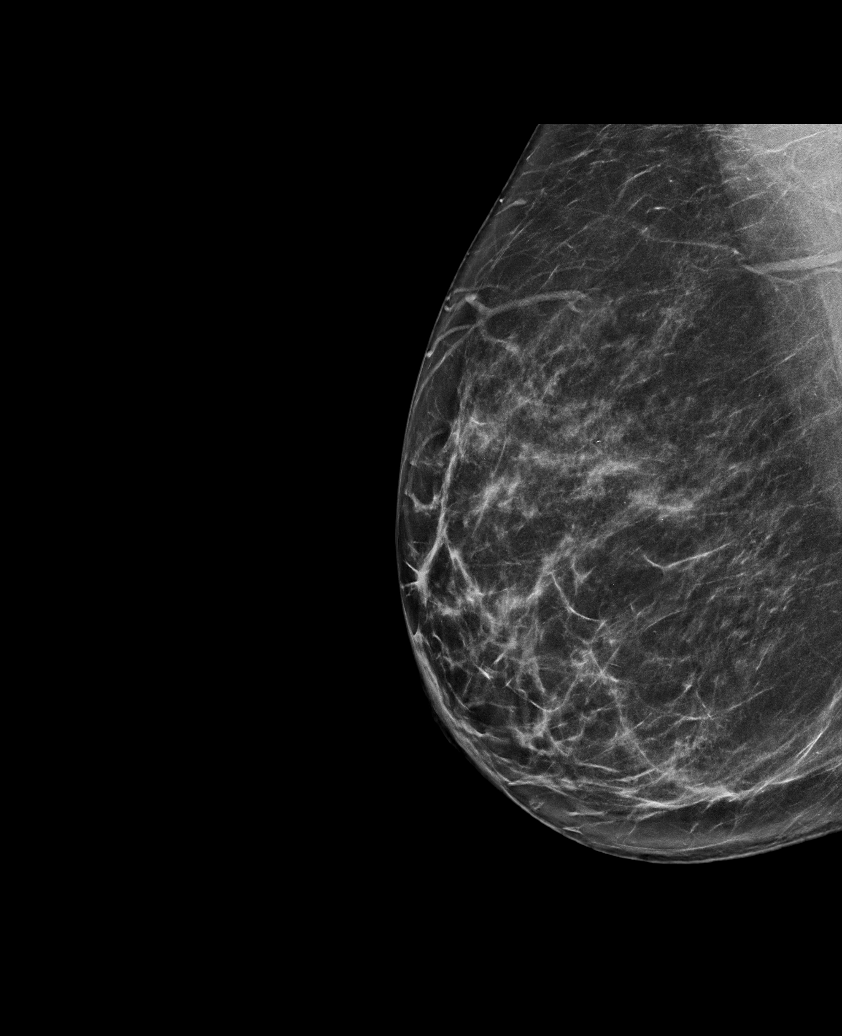

[R CC synth-2D]
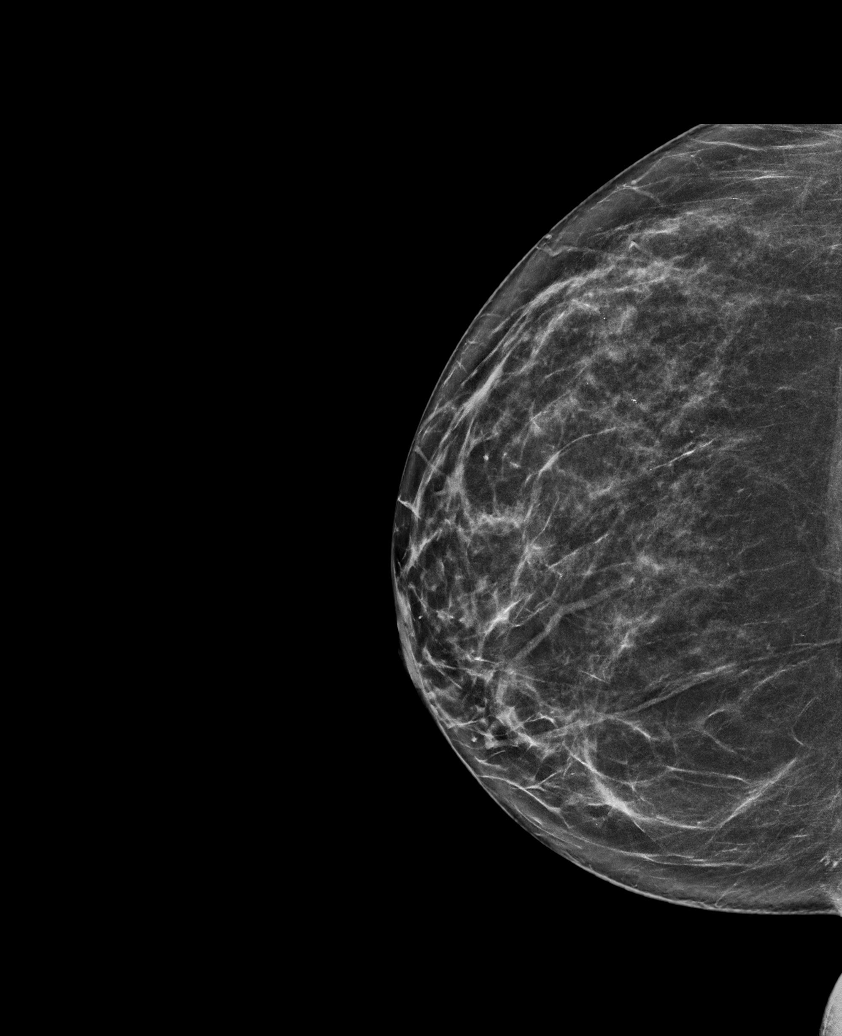

[R CC tomo · tomo slice 37/73.0]
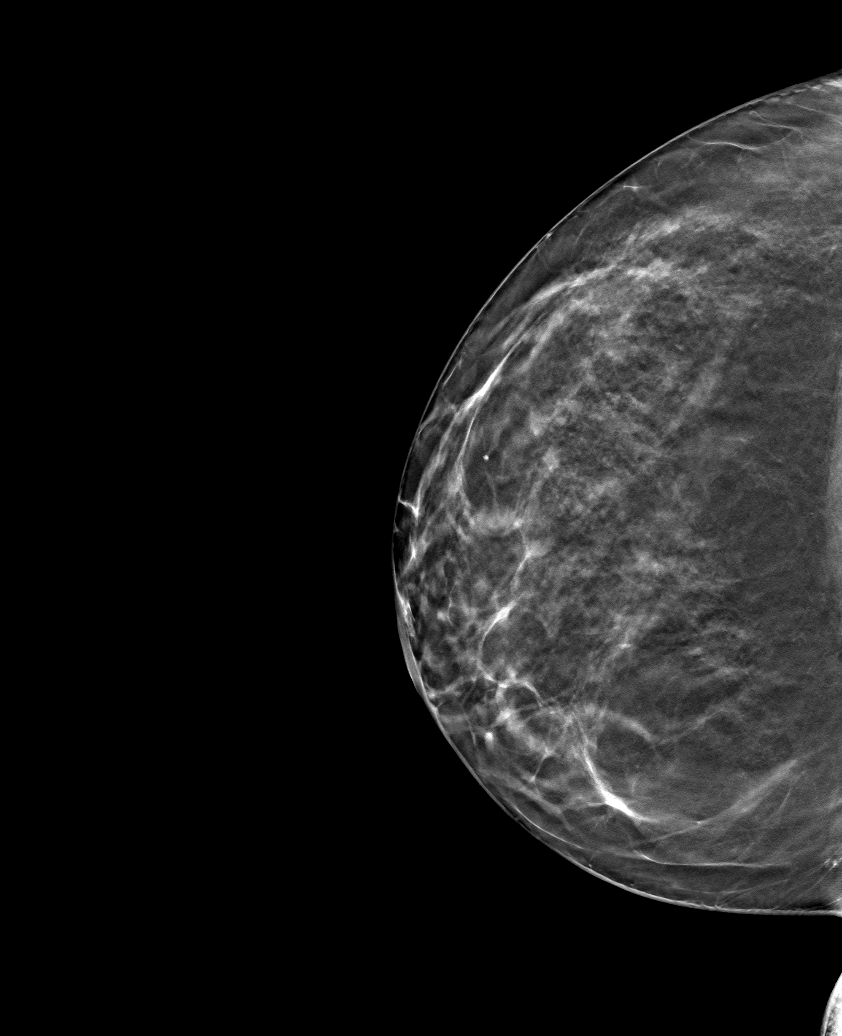

[6 of 25 positions shown; findings below may reference images not displayed]

ACR Breast Density Category c: The breast tissue is heterogeneously
dense, which may obscure small masses.
FINDINGS: Right breast: No suspicious mass, distortion, or microcalcifications
are identified to suggest presence of malignancy.

Left breast: There are stable postsurgical changes in the central
upper breast at the lumpectomy site. No suspicious mass, distortion,
or microcalcifications are identified to suggest presence of
malignancy.

Mammographic images were processed with CAD.
IMPRESSION: Left breast postsurgical changes. No mammographic evidence of
malignancy in the bilateral breasts.

RECOMMENDATION:
Diagnostic bilateral mammogram in 1 year.

I have discussed the findings and recommendations with the patient.
If applicable, a reminder letter will be sent to the patient
regarding the next appointment.

BI-RADS CATEGORY  2: Benign.

## 2020-01-27 NOTE — Assessment & Plan Note (Deleted)
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  No further issues  Breast Cancer Surveillance: 1. Breast exam2/17/2020: Benign 2. Mammogram  11/02/2019: Benign, breast density category C  Return to clinic in 1 year for follow-up

## 2020-01-28 ENCOUNTER — Ambulatory Visit: Payer: 59 | Admitting: Hematology and Oncology

## 2020-02-01 NOTE — Progress Notes (Signed)
Patient Care Team: [name], PA as PCP - General (Family Medicine) [name], MD as Consulting Physician (General Surgery) Nicholas Lose, MD as Consulting Physician (Hematology and Oncology) Kyung Rudd, MD as Consulting Physician (Radiation Oncology) Gardenia Phlegm, NP as Nurse Practitioner (Hematology and Oncology)  DIAGNOSIS:    ICD-10-CM   1. Malignant neoplasm of upper-inner quadrant of left breast in female, estrogen receptor positive ([place])  C50.212    Z17.0     SUMMARY OF ONCOLOGIC HISTORY: Oncology History  Malignant neoplasm of upper-inner quadrant of left female breast ([place])  11/07/2016 Initial Diagnosis   Left breast biopsy UIQ: 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  The Hereditary Gene Panel offered by [facility] includes sequencing and/or deletion duplication testing of the following 43 genes: APC, ATM, AXIN2, BARD1, BMPR1A, BRCA1, BRCA2, BRIP1, CDH1, CDKN2A (p14ARF), CDKN2A (p16INK4a), CHEK2, DICER1, EPCAM (Deletion/duplication testing only), GREM1 (promoter region deletion/duplication testing only), KIT, MEN1, MLH1, MSH2, MSH6, MUTYH, NBN, NF1, PALB2, PDGFRA, PMS2, POLD1, POLE, PTEN, RAD50, RAD51C, RAD51D, SDHB, SDHC, SDHD, SMAD4, SMARCA4. STK11, TP53, TSC1, TSC2, and VHL.  The following gene was evaluated for sequence changes only: SDHA and HOXB13 c.251G>A.  The report date is December 02, 2016.   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  2) Left Breast Boost: 10 Gy in 5 fractions.   04/09/2017 -  Anti-estrogen oral therapy   Tamoxifen 20 mg daily     01/27/2018 Surgery   Myomectomy (fibroids)     CHIEF COMPLIANT: Follow-up of left breast DCIS on tamoxifen therapy   INTERVAL HISTORY: Donna Matthews is a 44 y.o. with above-mentioned history of left breast DCIS treated with lumpectomy, radiation, and who is currently on tamoxifen. Mammogram on 11/02/19 showed no evidence of malignancy bilaterally. She presents to the clinic today for annual follow-up.  Hot flashes have improved somewhat but she is having increased hair loss.  Denies any pain lumps or nodules in the breast.  ALLERGIES:  is allergic to bee venom and penicillins.  MEDICATIONS:  Current Outpatient Medications  Medication Sig Dispense Refill  . acetaminophen (TYLENOL) 500 MG tablet Take 500-1,000 mg by mouth every 6 (six) hours as needed (FOR PAIN).    Marland Kitchen Biotin 1 MG CAPS Take 1 mg by mouth daily.    . Cholecalciferol (VITAMIN D3) 5000 units CAPS Take 5,000 Units by mouth daily.    . Ferrous Sulfate (IRON) 325 (65 Fe) MG TABS Take 1 tablet by mouth 3 (three) times daily. 30 each 0  . ibuprofen (ADVIL,MOTRIN) 800 MG tablet Take 1 tablet (800 mg total) by mouth every 8 (eight) hours. 60 tablet 0  . Multiple Vitamins-Minerals (ADULT GUMMY PO) Take 2 tablets by mouth daily.    Marland Kitchen nystatin cream (MYCOSTATIN) Apply 1 application topically 2 (two) times daily. 30 g 3  . nystatin-triamcinolone ointment (MYCOLOG) Apply 1 application topically 2 (two) times daily. 30 g 0  . tamoxifen (NOLVADEX) 20 MG tablet Take 1 tablet (20 mg total) by mouth daily at 2 PM. 90 tablet 3   No current facility-administered medications for this visit.  PHYSICAL EXAMINATION: ECOG PERFORMANCE STATUS: 1 - Symptomatic but completely ambulatory  Vitals:   02/02/20 0851  BP: 138/80  Pulse: 88  Resp: 19  Temp: 97.8 F (36.6 C)  SpO2: 100%   There were no vitals filed for this visit.  BREAST: No palpable masses or nodules in either right or left breasts. No palpable axillary supraclavicular or  infraclavicular adenopathy no breast tenderness or nipple discharge. (exam performed in the presence of a chaperone)  LABORATORY DATA:  I have reviewed the data as listed CMP Latest Ref Rng & Units 11/26/2017 11/22/2017 11/21/2017  Glucose 65 - 99 mg/dL 93 113(H) 117(H)  BUN 7 - 25 mg/dL 6(L) 10 13  Creatinine 0.50 - 1.10 mg/dL 0.59 0.51 0.49  Sodium 135 - 146 mmol/L 141 139 141  Potassium 3.5 - 5.3 mmol/L 4.1 3.3(L) 3.9  Chloride 98 - 110 mmol/L 109 109 112(H)  CO2 20 - 32 mmol/L 25 24 26   Calcium 8.6 - 10.2 mg/dL 8.7 7.4(L) 8.3(L)  Total Protein 6.1 - 8.1 g/dL 5.9(L) - 6.0(L)  Total Bilirubin 0.2 - 1.2 mg/dL 0.3 - 0.6  Alkaline Phos 38 - 126 U/L - - 37(L)  AST 10 - 30 U/L 13 - 17  ALT 6 - 29 U/L 12 - 14    Lab Results  Component Value Date   WBC 5.9 06/03/2018   HGB 12.0 06/03/2018   HCT 33.4 (L) 06/03/2018   MCV 76.4 (L) 06/03/2018   PLT 265 06/03/2018   NEUTROABS 3,280 06/03/2018    ASSESSMENT & PLAN:  Malignant neoplasm of upper-inner quadrant of left female breast ([place]) 12/18/16: 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  No further issues  Hair loss: I discussed with her that multiple factors can cause hair loss including stress, nutritional deficiencies as well as tamoxifen.  Breast Cancer Surveillance: 1. Breast exam2/23/21: Benign 2. Mammogram11/23/2020: Benign, breast density category C  Return to clinic in1 yearfor follow-up    No orders of the defined types were placed in this encounter.  The patient has a good understanding of the overall plan. she agrees with it. she will call with any problems that may develop before the next  visit here.  Total time spent: 20 mins including face to face time and time spent for planning, charting and coordination of care  Nicholas Lose, MD 02/02/2020  I, Cloyde Reams Dorshimer, am acting as scribe for Dr. Nicholas Lose.  I have reviewed the above documentation for accuracy and completeness, and I agree with the above.

## 2020-02-02 ENCOUNTER — Other Ambulatory Visit: Payer: Self-pay

## 2020-02-02 ENCOUNTER — Inpatient Hospital Stay: Payer: 59 | Attending: Hematology and Oncology | Admitting: Hematology and Oncology

## 2020-02-02 DIAGNOSIS — N951 Menopausal and female climacteric states: Secondary | ICD-10-CM | POA: Diagnosis not present

## 2020-02-02 DIAGNOSIS — Z79810 Long term (current) use of selective estrogen receptor modulators (SERMs): Secondary | ICD-10-CM | POA: Diagnosis not present

## 2020-02-02 DIAGNOSIS — C50212 Malignant neoplasm of upper-inner quadrant of left female breast: Secondary | ICD-10-CM | POA: Diagnosis not present

## 2020-02-02 DIAGNOSIS — Z791 Long term (current) use of non-steroidal anti-inflammatories (NSAID): Secondary | ICD-10-CM | POA: Insufficient documentation

## 2020-02-02 DIAGNOSIS — Z923 Personal history of irradiation: Secondary | ICD-10-CM | POA: Diagnosis not present

## 2020-02-02 DIAGNOSIS — N920 Excessive and frequent menstruation with regular cycle: Secondary | ICD-10-CM | POA: Insufficient documentation

## 2020-02-02 DIAGNOSIS — R252 Cramp and spasm: Secondary | ICD-10-CM | POA: Diagnosis not present

## 2020-02-02 DIAGNOSIS — Z170 Estrogen receptor positive status [ER+]: Secondary | ICD-10-CM | POA: Diagnosis not present

## 2020-02-02 DIAGNOSIS — Z79899 Other long term (current) drug therapy: Secondary | ICD-10-CM | POA: Diagnosis not present

## 2020-02-02 MED ORDER — TAMOXIFEN CITRATE 20 MG PO TABS: 20.0000 mg | ORAL_TABLET | Freq: Every day | ORAL | 3 refills | Status: DC

## 2020-02-02 NOTE — Assessment & Plan Note (Signed)
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  No further issues  Breast Cancer Surveillance: 1. Breast exam2/23/21: Benign 2. Mammogram11/23/2020: Benign, breast density category C  Return to clinic in1 yearfor follow-up

## 2020-02-03 ENCOUNTER — Telehealth: Payer: Self-pay | Admitting: Hematology and Oncology

## 2020-02-03 NOTE — Telephone Encounter (Signed)
I talk with patient regarding schedule  

## 2020-04-27 ENCOUNTER — Encounter: Payer: Self-pay | Admitting: Nurse Practitioner

## 2020-04-27 ENCOUNTER — Other Ambulatory Visit: Payer: Self-pay

## 2020-04-27 ENCOUNTER — Ambulatory Visit (INDEPENDENT_AMBULATORY_CARE_PROVIDER_SITE_OTHER): Payer: 59 | Admitting: Nurse Practitioner

## 2020-04-27 VITALS — BP 130/80

## 2020-04-27 DIAGNOSIS — Z113 Encounter for screening for infections with a predominantly sexual mode of transmission: Secondary | ICD-10-CM

## 2020-04-27 DIAGNOSIS — N898 Other specified noninflammatory disorders of vagina: Secondary | ICD-10-CM

## 2020-04-27 DIAGNOSIS — B369 Superficial mycosis, unspecified: Secondary | ICD-10-CM | POA: Diagnosis not present

## 2020-04-27 DIAGNOSIS — R350 Frequency of micturition: Secondary | ICD-10-CM | POA: Diagnosis not present

## 2020-04-27 LAB — WET PREP FOR TRICH, YEAST, CLUE

## 2020-04-27 MED ORDER — NYSTATIN 100000 UNIT/GM EX POWD: 1.0000 "application " | Freq: Three times a day (TID) | CUTANEOUS | 0 refills | Status: DC

## 2020-04-27 MED ORDER — NYSTATIN-TRIAMCINOLONE 100000-0.1 UNIT/GM-% EX OINT: 1.0000 "application " | TOPICAL_OINTMENT | Freq: Two times a day (BID) | CUTANEOUS | 0 refills | Status: DC

## 2020-04-27 NOTE — Progress Notes (Signed)
   Acute Office Visit  Subjective:    Patient ID: Donna Matthews, female    DOB: [birth year], 44 y.o.   MRN: [id number]  Chief Complaint  Patient presents with  . Urinary Frequency    ml  . Vaginal Itching  . vaginal odor  . std screening  . breast exam    possible yeast    HPI Presents today for urinary frequency that is not new for her, vaginal odor, groin and breast redness and discomfort. Not currently sexual active but would like an STD screening done.    Review of Systems  Gastrointestinal: Negative.   Genitourinary: Positive for frequency. Negative for dysuria, flank pain, hematuria, pelvic pain, urgency, vaginal bleeding and vaginal discharge.  Skin: Positive for rash (bilateral breasts and groin).       Objective:    Physical Exam Constitutional:      Appearance: Normal appearance.  Chest:     Comments: Mild redness in breast folds Genitourinary:    General: Normal vulva.     Labia:        Right: No rash.        Left: No rash.      Vagina: Normal.     Cervix: Normal.     Uterus: Normal.   Skin:    Findings: Rash (mild redness to bilateral groin and breast folds) present.    BP 130/80 (BP Location: Right Arm, Patient Position: Sitting, Cuff Size: Normal)   LMP 04/05/2020  Wt Readings from Last 3 Encounters:  12/14/19 176 lb 3.2 oz (79.9 kg)  07/28/19 179 lb 12.8 oz (81.6 kg)  01/26/19 189 lb 1.6 oz (85.8 kg)   UA: Negative leukocytes, trace blood Wet prep negative     Assessment & Plan:   Problem List Items Addressed This Visit    None    Visit Diagnoses    Urinary frequency    -  Primary   Relevant Orders   Urinalysis,Complete w/RFL Culture   Fungal skin infection       Relevant Medications   nystatin-triamcinolone ointment (MYCOLOG)   nystatin (MYCOSTATIN/NYSTOP) powder   Vulvovaginal candidiasis       Relevant Medications   nystatin-triamcinolone ointment (MYCOLOG)   nystatin (MYCOSTATIN/NYSTOP) powder   Screen for STD (sexually  transmitted disease)       Relevant Orders   HIV Antibody (routine testing w rflx)   RPR   C. trachomatis/N. gonorrhoeae RNA      Plan: Reviewed normal wet prep and UA. Mycolog prescription with instructions to apply thin layer to external vagina as needed. Nystatin powder should be applied to groin and under breasts. Try to keep areas dry and avoid rubbing of tight clothes/bra. GC/chlamydia, HIV, RPR pending.     Tamela Gammon [facility], 2:33 PM 04/27/2020

## 2020-04-28 ENCOUNTER — Ambulatory Visit: Payer: No Typology Code available for payment source | Admitting: Nurse Practitioner

## 2020-04-28 LAB — RPR: RPR Ser Ql: NONREACTIVE

## 2020-04-28 LAB — C. TRACHOMATIS/N. GONORRHOEAE RNA
C. trachomatis RNA, TMA: NOT DETECTED
N. gonorrhoeae RNA, TMA: NOT DETECTED

## 2020-04-28 LAB — HIV ANTIBODY (ROUTINE TESTING W REFLEX): HIV 1&2 Ab, 4th Generation: NONREACTIVE

## 2020-04-29 ENCOUNTER — Other Ambulatory Visit: Payer: Self-pay | Admitting: *Deleted

## 2020-04-29 LAB — URINALYSIS, COMPLETE W/RFL CULTURE
Bilirubin Urine: NEGATIVE
Glucose, UA: NEGATIVE
Hyaline Cast: NONE SEEN /LPF
Ketones, ur: NEGATIVE
Leukocyte Esterase: NEGATIVE
Nitrites, Initial: NEGATIVE
Protein, ur: NEGATIVE
Specific Gravity, Urine: 1.02 (ref 1.001–1.03)
pH: 8.5 — ABNORMAL HIGH (ref 5.0–8.0)

## 2020-04-29 LAB — URINE CULTURE
MICRO NUMBER:: 10496013
SPECIMEN QUALITY:: ADEQUATE

## 2020-04-29 LAB — CULTURE INDICATED

## 2020-04-29 MED ORDER — NYSTATIN 100000 UNIT/GM EX POWD
1.0000 | Freq: Three times a day (TID) | CUTANEOUS | 0 refills | Status: DC
Start: 2020-04-29 — End: 2021-02-01

## 2020-04-29 NOTE — Progress Notes (Signed)
Received call from pt stating she is experiencing a red rash under her left breast.  Symptoms began 1 week ago and pt states rash is itchy.  Per MD pt to be prescribed nystatin powder to apply to the area 3 times day as needed.  RN also educated pt on wearing a cotton bra and keeping that area as dry as possible.  Pt verbalized understanding and appreciative of the advice.

## 2020-06-01 ENCOUNTER — Other Ambulatory Visit: Payer: Self-pay | Admitting: Family Medicine

## 2020-06-01 DIAGNOSIS — N644 Mastodynia: Secondary | ICD-10-CM

## 2020-06-17 ENCOUNTER — Ambulatory Visit
Admission: RE | Admit: 2020-06-17 | Discharge: 2020-06-17 | Disposition: A | Discharge status: Home / Self Care | Payer: 59 | Source: Ambulatory Visit | Attending: Family Medicine | Admitting: Family Medicine

## 2020-06-17 ENCOUNTER — Other Ambulatory Visit: Payer: Self-pay | Admitting: Family Medicine

## 2020-06-17 ENCOUNTER — Ambulatory Visit: Payer: 59

## 2020-06-17 ENCOUNTER — Ambulatory Visit: Admission: RE | Admit: 2020-06-17 | Payer: 59 | Source: Ambulatory Visit

## 2020-06-17 ENCOUNTER — Other Ambulatory Visit: Payer: Self-pay

## 2020-06-17 DIAGNOSIS — R921 Mammographic calcification found on diagnostic imaging of breast: Secondary | ICD-10-CM

## 2020-06-17 DIAGNOSIS — N644 Mastodynia: Secondary | ICD-10-CM

## 2020-06-20 ENCOUNTER — Other Ambulatory Visit: Payer: Self-pay | Admitting: Family Medicine

## 2020-06-20 ENCOUNTER — Ambulatory Visit
Admission: RE | Admit: 2020-06-20 | Discharge: 2020-06-20 | Disposition: A | Discharge status: Home / Self Care | Payer: No Typology Code available for payment source | Source: Ambulatory Visit | Attending: Family Medicine | Admitting: Family Medicine

## 2020-06-20 ENCOUNTER — Other Ambulatory Visit: Payer: Self-pay

## 2020-06-20 DIAGNOSIS — R921 Mammographic calcification found on diagnostic imaging of breast: Secondary | ICD-10-CM

## 2020-12-19 ENCOUNTER — Ambulatory Visit (INDEPENDENT_AMBULATORY_CARE_PROVIDER_SITE_OTHER): Payer: 59 | Admitting: Nurse Practitioner

## 2020-12-19 ENCOUNTER — Other Ambulatory Visit: Payer: Self-pay

## 2020-12-19 ENCOUNTER — Encounter: Payer: Self-pay | Admitting: Nurse Practitioner

## 2020-12-19 VITALS — BP 130/80 | Ht 63.0 in | Wt 193.0 lb

## 2020-12-19 DIAGNOSIS — Z113 Encounter for screening for infections with a predominantly sexual mode of transmission: Secondary | ICD-10-CM

## 2020-12-19 DIAGNOSIS — Z01419 Encounter for gynecological examination (general) (routine) without abnormal findings: Secondary | ICD-10-CM | POA: Diagnosis not present

## 2020-12-19 DIAGNOSIS — Z853 Personal history of malignant neoplasm of breast: Secondary | ICD-10-CM

## 2020-12-19 NOTE — Progress Notes (Signed)
   Donna Matthews Jan 08, 1976 [id number]   History:  45 y.o. G2P0020 presents for annual exam without GYN complaints. Irregular cycles, this is not new for her and has worsened since starting Tamoxifen. 1998 LGSIL, subsequent paps normal. 2017 left breast cancer managed with lumpectomy, radiation and antiestrogen therapy started in 2018 for 5 year duration. BRCA negative. 2019 myomectomy with improvement of menorrhagia.   Gynecologic History Patient's last menstrual period was 12/05/2020. Period Pattern: (!) Irregular Menstrual Control: Maxi pad,Tampon Dysmenorrhea: (!) Mild Dysmenorrhea Symptoms: Cramping Contraception/Family planning: none  Health Maintenance Last Pap: 12/02/2017. Results were: normal Last mammogram: 06/17/2020. Results were: left breast biopsy showed fibrosis with calcifications  Past medical history, past surgical history, family history and social history were all reviewed and documented in the EPIC chart.  ROS:  A ROS was performed and pertinent positives and negatives are included.  Exam:  Vitals:   12/19/20 1615  BP: 130/80  Weight: 193 lb (87.5 kg)  Height: _0  (1.6 m)   Body mass index is 34.19 kg/m.  General appearance:  Normal Thyroid:  Symmetrical, normal in size, without palpable masses or nodularity. Respiratory  Auscultation:  Clear without wheezing or rhonchi Cardiovascular  Auscultation:  Regular rate, without rubs, murmurs or gallops  Edema/varicosities:  Not grossly evident Abdominal  Soft,nontender, without masses, guarding or rebound.  Liver/spleen:  No organomegaly noted  Hernia:  None appreciated  Skin  Inspection:  Grossly normal   Breasts: Examined lying and sitting.   Right: Without masses, retractions, discharge or axillary adenopathy.   Left: Without masses, retractions, discharge or axillary adenopathy. Well-healed lumpectomy scar. Gentitourinary   Inguinal/mons:  Normal without inguinal adenopathy  External  genitalia:  Normal  BUS/Urethra/Skene's glands:  Normal  Vagina:  Normal  Cervix:  Normal  Uterus:  Normal in size, shape and contour.  Midline and mobile  Adnexa/parametria:     Rt: Without masses or tenderness.   Lt: Without masses or tenderness.  Anus and perineum: Normal  Assessment/Plan:  45 y.o. G2P0020 for annual exam.   Well female exam with routine gynecological exam - Education provided on SBEs, importance of preventative screenings, current guidelines, high calcium diet, regular exercise, and multivitamin daily. Labs with PCP.  Screen for STD (sexually transmitted disease) - Plan: C. trachomatis/N. gonorrhoeae RNA, HIV Antibody (routine testing w rflx), RPR.   History of cancer of left breast - 2017 left breast cancer managed with lumpectomy, radiation and antiestrogen therapy started in 2018 for 5 year duration. BRCA negative. Continue annual screenings. Normal breast exam today.  Screening for cervical cancer - 1998 LGSIL, subsequent paps normal. Will repeat at 5-year interval per guidelines.  Follow up in 1 year for annual.       Tamela Gammon [facility], 4:21 PM 12/19/2020

## 2020-12-19 NOTE — Patient Instructions (Signed)
Health Maintenance, Female Adopting a healthy lifestyle and getting preventive care are important in promoting health and wellness. Ask your health care provider about:  The right schedule for you to have regular tests and exams.  Things you can do on your own to prevent diseases and keep yourself healthy. What should I know about diet, weight, and exercise? Eat a healthy diet  Eat a diet that includes plenty of vegetables, fruits, low-fat dairy products, and lean protein.  Do not eat a lot of foods that are high in solid fats, added sugars, or sodium.   Maintain a healthy weight Body mass index (BMI) is used to identify weight problems. It estimates body fat based on height and weight. Your health care provider can help determine your BMI and help you achieve or maintain a healthy weight. Get regular exercise Get regular exercise. This is one of the most important things you can do for your health. Most adults should:  Exercise for at least 150 minutes each week. The exercise should increase your heart rate and make you sweat (moderate-intensity exercise).  Do strengthening exercises at least twice a week. This is in addition to the moderate-intensity exercise.  Spend less time sitting. Even light physical activity can be beneficial. Watch cholesterol and blood lipids Have your blood tested for lipids and cholesterol at 47 years of age, then have this test every 5 years. Have your cholesterol levels checked more often if:  Your lipid or cholesterol levels are high.  You are older than 47 years of age.  You are at high risk for heart disease. What should I know about cancer screening? Depending on your health history and family history, you may need to have cancer screening at various ages. This may include screening for:  Breast cancer.  Cervical cancer.  Colorectal cancer.  Skin cancer.  Lung cancer. What should I know about heart disease, diabetes, and high blood  pressure? Blood pressure and heart disease  High blood pressure causes heart disease and increases the risk of stroke. This is more likely to develop in people who have high blood pressure readings, are of African descent, or are overweight.  Have your blood pressure checked: ? Every 3-5 years if you are 18-39 years of age. ? Every year if you are 40 years old or older. Diabetes Have regular diabetes screenings. This checks your fasting blood sugar level. Have the screening done:  Once every three years after age 40 if you are at a normal weight and have a low risk for diabetes.  More often and at a younger age if you are overweight or have a high risk for diabetes. What should I know about preventing infection? Hepatitis B If you have a higher risk for hepatitis B, you should be screened for this virus. Talk with your health care provider to find out if you are at risk for hepatitis B infection. Hepatitis C Testing is recommended for:  Everyone born from 1945 through 1965.  Anyone with known risk factors for hepatitis C. Sexually transmitted infections (STIs)  Get screened for STIs, including gonorrhea and chlamydia, if: ? You are sexually active and are younger than 47 years of age. ? You are older than 47 years of age and your health care provider tells you that you are at risk for this type of infection. ? Your sexual activity has changed since you were last screened, and you are at increased risk for chlamydia or gonorrhea. Ask your health care provider   if you are at risk.  Ask your health care provider about whether you are at high risk for HIV. Your health care provider may recommend a prescription medicine to help prevent HIV infection. If you choose to take medicine to prevent HIV, you should first get tested for HIV. You should then be tested every 3 months for as long as you are taking the medicine. Pregnancy  If you are about to stop having your period (premenopausal) and  you may become pregnant, seek counseling before you get pregnant.  Take 400 to 800 micrograms (mcg) of folic acid every day if you become pregnant.  Ask for birth control (contraception) if you want to prevent pregnancy. Osteoporosis and menopause Osteoporosis is a disease in which the bones lose minerals and strength with aging. This can result in bone fractures. If you are 65 years old or older, or if you are at risk for osteoporosis and fractures, ask your health care provider if you should:  Be screened for bone loss.  Take a calcium or vitamin D supplement to lower your risk of fractures.  Be given hormone replacement therapy (HRT) to treat symptoms of menopause. Follow these instructions at home: Lifestyle  Do not use any products that contain nicotine or tobacco, such as cigarettes, e-cigarettes, and chewing tobacco. If you need help quitting, ask your health care provider.  Do not use street drugs.  Do not share needles.  Ask your health care provider for help if you need support or information about quitting drugs. Alcohol use  Do not drink alcohol if: ? Your health care provider tells you not to drink. ? You are pregnant, may be pregnant, or are planning to become pregnant.  If you drink alcohol: ? Limit how much you use to 0-1 drink a day. ? Limit intake if you are breastfeeding.  Be aware of how much alcohol is in your drink. In the [place] one drink equals one 12 oz bottle of beer (355 mL), one 5 oz glass of wine (148 mL), or one 1 oz glass of hard liquor (44 mL). General instructions  Schedule regular health, dental, and eye exams.  Stay current with your vaccines.  Tell your health care provider if: ? You often feel depressed. ? You have ever been abused or do not feel safe at home. Summary  Adopting a healthy lifestyle and getting preventive care are important in promoting health and wellness.  Follow your health care provider's instructions about healthy  diet, exercising, and getting tested or screened for diseases.  Follow your health care provider's instructions on monitoring your cholesterol and blood pressure. This information is not intended to replace advice given to you by your health care provider. Make sure you discuss any questions you have with your health care provider. Document Revised: 11/19/2018 Document Reviewed: 11/19/2018 Elsevier Patient Education  2021 [employer]  

## 2020-12-20 LAB — C. TRACHOMATIS/N. GONORRHOEAE RNA
C. trachomatis RNA, TMA: NOT DETECTED
N. gonorrhoeae RNA, TMA: NOT DETECTED

## 2020-12-20 LAB — RPR: RPR Ser Ql: NONREACTIVE

## 2020-12-20 LAB — HIV ANTIBODY (ROUTINE TESTING W REFLEX): HIV 1&2 Ab, 4th Generation: NONREACTIVE

## 2021-01-31 NOTE — Assessment & Plan Note (Deleted)
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 No further issues  Hair loss: I discussed with her that multiple factors can cause hair loss including stress, nutritional deficiencies as well as tamoxifen.  Breast Cancer Surveillance: 1. Breast exam2/23/22:Benign 2. Mammogram7/9/21 Indeterminate cals: : Benign, breast density category C  Return to clinic in1 yearfor follow-up

## 2021-01-31 NOTE — Assessment & Plan Note (Signed)
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 No further issues  Hair loss: I discussed with her that multiple factors can cause hair loss including stress, nutritional deficiencies as well as tamoxifen.  Breast Cancer Surveillance: 1. Breast exam2/23/22:Benign 2. Mammogram7/9/21: Indeterminate calcs, Biopsy: Benign , breast density category C  Return to clinic in1 yearfor follow-up

## 2021-01-31 NOTE — Progress Notes (Signed)
Patient Care Team: Donna Huxley, PA as PCP - General (Family Medicine) [name], MD as Consulting Physician (General Surgery) Nicholas Lose, MD as Consulting Physician (Hematology and Oncology) Kyung Rudd, MD as Consulting Physician (Radiation Oncology) Gardenia Phlegm, NP as Nurse Practitioner (Hematology and Oncology)  DIAGNOSIS:    ICD-10-CM   1. Malignant neoplasm of upper-inner quadrant of left breast in female, estrogen receptor positive ([place])  C50.212    Z17.0     SUMMARY OF ONCOLOGIC HISTORY: Oncology History  Malignant neoplasm of upper-inner quadrant of left female breast ([place])  11/07/2016 Initial Diagnosis   Left breast biopsy UIQ: 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  The Hereditary Gene Panel offered by [facility] includes sequencing and/or deletion duplication testing of the following 43 genes: APC, ATM, AXIN2, BARD1, BMPR1A, BRCA1, BRCA2, BRIP1, CDH1, CDKN2A (p14ARF), CDKN2A (p16INK4a), CHEK2, DICER1, EPCAM (Deletion/duplication testing only), GREM1 (promoter region deletion/duplication testing only), KIT, MEN1, MLH1, MSH2, MSH6, MUTYH, NBN, NF1, PALB2, PDGFRA, PMS2, POLD1, POLE, PTEN, RAD50, RAD51C, RAD51D, SDHB, SDHC, SDHD, SMAD4, SMARCA4. STK11, TP53, TSC1, TSC2, and VHL.  The following gene was evaluated for sequence changes only: SDHA and HOXB13 c.251G>A.  The report date is December 02, 2016.   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  2) Left Breast Boost: 10 Gy in 5 fractions.   04/09/2017 -  Anti-estrogen oral therapy   Tamoxifen 20 mg daily    01/27/2018 Surgery   Myomectomy (fibroids)     CHIEF COMPLIANT: Follow-up of left breast DCIS on tamoxifen therapy   INTERVAL HISTORY: Donna Matthews is a 45 y.o. with above-mentioned history of left breast DCIS treated with lumpectomy, radiation, and who is currently on tamoxifen. Mammogram on 06/17/20 showed indeterminate calcifications at the left lumpectomy bed. Biopsy on 06/20/20 showed fibrosis with calcifications and no evidence of malignancy. She presents to the clinic today for annual follow-up. occasional hot flashes on tamoxifen. Otherwise she is tolerating it well. She felt some firmness in her left breast superior aspect.  ALLERGIES:  is allergic to bee venom and penicillins.  MEDICATIONS:  Current Outpatient Medications  Medication Sig Dispense Refill  . acetaminophen (TYLENOL) 500 MG tablet Take 500-1,000 mg by mouth every 6 (six) hours as needed (FOR PAIN).    Marland Kitchen Biotin 1 MG CAPS Take 1 mg by mouth daily.    . Cholecalciferol (VITAMIN D3) 5000 units CAPS Take 5,000 Units by mouth daily.    . Ferrous Sulfate (IRON) 325 (65 Fe) MG TABS Take 1 tablet by mouth 3 (three) times daily. 30 each 0  . ibuprofen (ADVIL,MOTRIN) 800 MG tablet Take 1 tablet (800 mg total) by mouth every 8 (eight) hours. 60 tablet 0  . Multiple Vitamins-Minerals (ADULT GUMMY PO) Take 2 tablets by mouth daily.    Marland Kitchen nystatin (MYCOSTATIN/NYSTOP) powder Apply 1 application topically 3 (three) times daily. Apply to to groin and under breasts 15 g 0  . nystatin (MYCOSTATIN/NYSTOP) powder Apply 1 application topically 3 (three) times daily. 15 g 0  . nystatin-triamcinolone ointment (MYCOLOG) Apply 1 application topically 2 (two) times daily. Apply  thin layer to external vagina. 30 g 0  . tamoxifen (NOLVADEX) 20 MG tablet Take 1 tablet (20 mg total) by mouth daily at 2 PM. 90 tablet 3   No current facility-administered medications for this visit.    PHYSICAL EXAMINATION: ECOG PERFORMANCE STATUS: 1 - Symptomatic but  completely ambulatory  Vitals:   02/01/21 0923  BP: 122/75  Pulse: 84  Resp: 20  Temp: 97.7 F (36.5 C)  SpO2: 100%   Filed Weights   02/01/21 0923  Weight: 191 lb 9.6 oz (86.9 kg)    BREAST: No palpable masses or nodules in either right or left breasts. No palpable axillary supraclavicular or infraclavicular adenopathy no breast tenderness or nipple discharge. (exam performed in the presence of a chaperone)  LABORATORY DATA:  I have reviewed the data as listed CMP Latest Ref Rng & Units 11/26/2017 11/22/2017 11/21/2017  Glucose 65 - 99 mg/dL 93 113(H) 117(H)  BUN 7 - 25 mg/dL 6(L) 10 13  Creatinine 0.50 - 1.10 mg/dL 0.59 0.51 0.49  Sodium 135 - 146 mmol/L 141 139 141  Potassium 3.5 - 5.3 mmol/L 4.1 3.3(L) 3.9  Chloride 98 - 110 mmol/L 109 109 112(H)  CO2 20 - 32 mmol/L 25 24 26   Calcium 8.6 - 10.2 mg/dL 8.7 7.4(L) 8.3(L)  Total Protein 6.1 - 8.1 g/dL 5.9(L) - 6.0(L)  Total Bilirubin 0.2 - 1.2 mg/dL 0.3 - 0.6  Alkaline Phos 38 - 126 U/L - - 37(L)  AST 10 - 30 U/L 13 - 17  ALT 6 - 29 U/L 12 - 14    Lab Results  Component Value Date   WBC 5.9 06/03/2018   HGB 12.0 06/03/2018   HCT 33.4 (L) 06/03/2018   MCV 76.4 (L) 06/03/2018   PLT 265 06/03/2018   NEUTROABS 3,280 06/03/2018    ASSESSMENT & PLAN:  Malignant neoplasm of upper-inner quadrant of left female breast ([place]) 12/18/16: 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 No further issues  I stressed with her the importance of exercise daily to reduce breast cancer risk.  Breast Cancer Surveillance: 1. Breast exam2/23/22:Benign 2. Mammogram7/9/21: Indeterminate calcs, Biopsy: Benign , breast  density category C  Return to clinic in1 yearfor follow-up    No orders of the defined types were placed in this encounter.  The patient has a good understanding of the overall plan. she agrees with it. she will call with any problems that may develop before the next visit here.  Total time spent: 20 mins including face to face time and time spent for planning, charting and coordination of care  Rulon Eisenmenger, MD, MPH 02/01/2021  I, Cloyde Reams Dorshimer, am acting as scribe for Dr. Nicholas Lose.  I have reviewed the above documentation for accuracy and completeness, and I agree with the above.

## 2021-02-01 ENCOUNTER — Ambulatory Visit: Payer: 59 | Admitting: Hematology and Oncology

## 2021-02-01 ENCOUNTER — Inpatient Hospital Stay
Payer: No Typology Code available for payment source | Attending: Hematology and Oncology | Admitting: Hematology and Oncology

## 2021-02-01 ENCOUNTER — Telehealth: Payer: Self-pay | Admitting: Hematology and Oncology

## 2021-02-01 ENCOUNTER — Other Ambulatory Visit: Payer: Self-pay

## 2021-02-01 DIAGNOSIS — Z923 Personal history of irradiation: Secondary | ICD-10-CM | POA: Insufficient documentation

## 2021-02-01 DIAGNOSIS — Z79899 Other long term (current) drug therapy: Secondary | ICD-10-CM | POA: Insufficient documentation

## 2021-02-01 DIAGNOSIS — N920 Excessive and frequent menstruation with regular cycle: Secondary | ICD-10-CM | POA: Diagnosis not present

## 2021-02-01 DIAGNOSIS — Z79810 Long term (current) use of selective estrogen receptor modulators (SERMs): Secondary | ICD-10-CM | POA: Insufficient documentation

## 2021-02-01 DIAGNOSIS — Z170 Estrogen receptor positive status [ER+]: Secondary | ICD-10-CM | POA: Diagnosis not present

## 2021-02-01 DIAGNOSIS — D259 Leiomyoma of uterus, unspecified: Secondary | ICD-10-CM | POA: Insufficient documentation

## 2021-02-01 DIAGNOSIS — C50212 Malignant neoplasm of upper-inner quadrant of left female breast: Secondary | ICD-10-CM | POA: Insufficient documentation

## 2021-02-01 MED ORDER — TAMOXIFEN CITRATE 20 MG PO TABS: 20.0000 mg | ORAL_TABLET | Freq: Every day | ORAL | 3 refills | Status: DC

## 2021-02-01 NOTE — Telephone Encounter (Signed)
Scheduled appointment per 2/23 los. Spoke to patient who is aware of appointment date and time.

## 2021-06-20 ENCOUNTER — Other Ambulatory Visit: Payer: Self-pay | Admitting: Family Medicine

## 2021-06-20 DIAGNOSIS — R921 Mammographic calcification found on diagnostic imaging of breast: Secondary | ICD-10-CM

## 2021-07-04 ENCOUNTER — Other Ambulatory Visit: Payer: Self-pay | Admitting: Hematology and Oncology

## 2021-07-04 DIAGNOSIS — R921 Mammographic calcification found on diagnostic imaging of breast: Secondary | ICD-10-CM

## 2021-07-04 DIAGNOSIS — Z853 Personal history of malignant neoplasm of breast: Secondary | ICD-10-CM

## 2021-07-07 ENCOUNTER — Ambulatory Visit
Admission: RE | Admit: 2021-07-07 | Discharge: 2021-07-07 | Disposition: A | Discharge status: Home / Self Care | Payer: No Typology Code available for payment source | Source: Ambulatory Visit | Attending: Family Medicine | Admitting: Family Medicine

## 2021-07-07 ENCOUNTER — Other Ambulatory Visit: Payer: Self-pay

## 2021-07-07 DIAGNOSIS — Z853 Personal history of malignant neoplasm of breast: Secondary | ICD-10-CM

## 2021-07-07 DIAGNOSIS — R921 Mammographic calcification found on diagnostic imaging of breast: Secondary | ICD-10-CM

## 2021-09-26 ENCOUNTER — Ambulatory Visit: Payer: 59 | Admitting: Obstetrics & Gynecology

## 2021-10-10 ENCOUNTER — Ambulatory Visit: Payer: Self-pay | Admitting: Obstetrics & Gynecology

## 2021-10-25 ENCOUNTER — Telehealth: Payer: Self-pay | Admitting: *Deleted

## 2021-10-25 NOTE — Telephone Encounter (Signed)
Received call from pt with complaint of hard nodule on left breast right at the incision line x 3 days.  Pt denies recent injury or trauma. Pt states in the past she was told there was scar tissue in the left breast but is concerned with feeling this new nodule.  Pt requesting in office visit for a breast exam for further evaluation.  Appt scheduled and pt verbalized understanding of date and time.

## 2021-10-25 NOTE — Progress Notes (Signed)
[place] [facility] Follow up:    Donna Matthews, [address] [place] [id number]   DIAGNOSIS:  Cancer Staging  Malignant neoplasm of upper-inner quadrant of left female breast [facility]) Staging form: Breast, AJCC 7th Edition - Clinical stage from 11/14/2016: Stage 0 (Tis (DCIS), N0, M0) - Unsigned Staged by: Pathologist and managing physician Laterality: Left Estrogen receptor status: Positive Progesterone receptor status: Positive Stage used in treatment planning: Yes National guidelines used in treatment planning: Yes Type of national guideline used in treatment planning: NCCN - Pathologic: Stage 0 (Tis (DCIS), N0, cM0) - Unsigned   SUMMARY OF ONCOLOGIC HISTORY: Oncology History  Malignant neoplasm of upper-inner quadrant of left female breast ([place])  11/07/2016 Initial Diagnosis   Left breast biopsy UIQ: 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  The Hereditary Gene Panel offered by [facility] includes sequencing and/or deletion duplication testing of the following 43 genes: APC, ATM, AXIN2, BARD1, BMPR1A, BRCA1, BRCA2, BRIP1, CDH1, CDKN2A (p14ARF), CDKN2A (p16INK4a), CHEK2, DICER1, EPCAM (Deletion/duplication testing only), GREM1 (promoter region deletion/duplication testing only), KIT, MEN1, MLH1, MSH2, MSH6, MUTYH, NBN, NF1, PALB2, PDGFRA, PMS2, POLD1, POLE, PTEN, RAD50, RAD51C, RAD51D, SDHB, SDHC, SDHD, SMAD4, SMARCA4. STK11, TP53, TSC1, TSC2, and VHL.  The following gene was evaluated for sequence changes only: SDHA and HOXB13 c.251G>A.  The report date is December 02, 2016.   12/18/2016 Surgery   Left Lumpectomy: HG DCIS 1.2 cm, Margins Neg, 0/7 LN Neg; TisN0 (Stage 0)   01/21/2017 - 03/06/2017 Radiation Therapy   Adjuvant radiation  therapy [facility]): 1) Left Breast: 50.4 Gy in 28 fractions.  2) Left Breast Boost: 10 Gy in 5 fractions.   04/09/2017 -  Anti-estrogen oral therapy   Tamoxifen 20 mg daily   01/27/2018 Surgery   Myomectomy (fibroids)     CURRENT THERAPY: Tamoxifen daily  INTERVAL HISTORY: Donna Matthews 45 y.o. woman is here today for urgent evaluation of a left breast knot.    Michelle's most recent mammogram was completed on 07/07/2021 and showed no mammographic evidence of malignancy and breast density category C.   On this past Monday Donna Matthews felt a knot over her lumpectomy scar.  Donna Matthews hasn't had any other changes in her breast that Donna Matthews has noticed.  Donna Matthews continues on Tamoxifen with good tolerance.     Patient Active Problem List   Diagnosis Date Noted   Leiomyoma 01/14/2018   Hypokalemia 11/22/2017   Orthostasis 11/22/2017   Fibroid 11/21/2017   Hyperglycemia 05/23/2017   Symptomatic anemia 05/22/2017   Genetic testing 12/05/2016   Family history of melanoma    Family history of stomach cancer    Malignant neoplasm of upper-inner quadrant of left female breast ([place]) 11/09/2016   Fibroids, submucosal 05/01/2013   Dysmenorrhea 05/01/2013   Anemia 05/01/2013   Menorrhagia 04/01/2013   Obesity (BMI 30.0-34.9) 10/31/2012    is allergic to bee venom and penicillins.  MEDICAL HISTORY: Past Medical History:  Diagnosis Date   Anemia    Breast cancer ([place]) 10/2016   Ectopic pregnancy    Family history of melanoma    Family history of stomach cancer    Fibroids    GERD (gastroesophageal reflux disease)    Personal history of radiation therapy    PONV (postoperative  nausea and vomiting)    STD (sexually transmitted disease)    Chlamydia    SURGICAL HISTORY: Past Surgical History:  Procedure Laterality Date   BREAST LUMPECTOMY Left 2017   BREAST LUMPECTOMY WITH RADIOACTIVE SEED AND SENTINEL LYMPH NODE BIOPSY Left 12/18/2016   Procedure: RADIOACTIVE SEED GUIDED LEFT BREAST LUMPECTOMY WITH LEFT  AXILLARY SENTINEL LYMPH NODE BIOPSY;  Surgeon: [name], MD;  Location: [facility];  Service: General;  Laterality: Left;   DILATION AND CURETTAGE OF UTERUS     MYOMECTOMY N/A 01/14/2018   Procedure: ABDOMINAL MYOMECTOMY;  Surgeon: Anastasio Auerbach, MD;  Location: [facility];  Service: Gynecology;  Laterality: N/A;   POLYPECTOMY N/A 01/14/2018   Procedure: POLYPECTOMY;  Surgeon: Anastasio Auerbach, MD;  Location: [facility];  Service: Gynecology;  Laterality: N/A;    SOCIAL HISTORY: Social History   Socioeconomic History   Marital status: Single    Spouse name: Not on file   Number of children: Not on file   Years of education: Not on file   Highest education level: Not on file  Occupational History   Occupation: Customer service  Tobacco Use   Smoking status: Never   Smokeless tobacco: Never  Vaping Use   Vaping Use: Never used  Substance and Sexual Activity   Alcohol use: No    Alcohol/week: 0.0 standard drinks   Drug use: No   Sexual activity: Not Currently    Birth control/protection: None    Comment: DECLINED INSURANCE QUESTIONS  Other Topics Concern   Not on file  Social History Narrative   Not on file   Social Determinants of Health   Financial Resource Strain: Not on file  Food Insecurity: Not on file  Transportation Needs: Not on file  Physical Activity: Not on file  Stress: Not on file  Social Connections: Not on file  Intimate Partner Violence: Not on file    FAMILY HISTORY: Family History  Problem Relation Age of Onset   Diabetes Mother    Hypertension Mother    Heart disease Father    Heart attack Father    Diabetes Sister    Hypertension Sister    Lung cancer Maternal Aunt    Melanoma Maternal Uncle    Lung cancer Maternal Uncle    Stomach cancer Other        paternal great aunt   Stomach cancer Cousin        father's maternal cousin   Cancer Other        2 maternal great aunts with unknown cancer   Breast cancer Neg Hx     Review of Systems   Constitutional:  Negative for appetite change, chills, fatigue, fever and unexpected weight change.  HENT:   Negative for hearing loss, lump/mass and trouble swallowing.   Eyes:  Negative for eye problems and icterus.  Respiratory:  Negative for chest tightness, cough and shortness of breath.   Cardiovascular:  Negative for chest pain, leg swelling and palpitations.  Gastrointestinal:  Negative for abdominal distention, abdominal pain, constipation, diarrhea, nausea and vomiting.  Endocrine: Negative for hot flashes.  Genitourinary:  Negative for difficulty urinating.   Musculoskeletal:  Negative for arthralgias.  Skin:  Negative for itching and rash.  Neurological:  Negative for dizziness, extremity weakness, headaches and numbness.  Hematological:  Negative for adenopathy. Does not bruise/bleed easily.  Psychiatric/Behavioral:  Negative for depression. The patient is not nervous/anxious.      PHYSICAL EXAMINATION  ECOG PERFORMANCE STATUS: 1 - Symptomatic  but completely ambulatory  Vitals:   10/26/21 1346  BP: (!) 152/92  Pulse: 79  Resp: 16  Temp: 98.1 F (36.7 C)  SpO2: 100%    Physical Exam Constitutional:      General: Donna Matthews is not in acute distress.    Appearance: Normal appearance. Donna Matthews is not toxic-appearing.  HENT:     Head: Normocephalic and atraumatic.  Eyes:     General: No scleral icterus. Cardiovascular:     Rate and Rhythm: Normal rate and regular rhythm.     Pulses: Normal pulses.     Heart sounds: Normal heart sounds.  Pulmonary:     Effort: Pulmonary effort is normal.     Breath sounds: Normal breath sounds.  Chest:     Comments: Left breast status postlumpectomy there is a small area of nodularity at the center of the scar that this is new per the patient Abdominal:     General: Abdomen is flat. Bowel sounds are normal. There is no distension.     Palpations: Abdomen is soft.     Tenderness: There is no abdominal tenderness.  Musculoskeletal:         General: No swelling.     Cervical back: Neck supple.  Lymphadenopathy:     Cervical: No cervical adenopathy.  Skin:    General: Skin is warm and dry.     Findings: No rash.  Neurological:     General: No focal deficit present.     Mental Status: Donna Matthews is alert.  Psychiatric:        Mood and Affect: Mood normal.        Behavior: Behavior normal.    LABORATORY DATA:  CBC    Component Value Date/Time   WBC 5.9 06/03/2018 1241   RBC 4.37 06/03/2018 1241   HGB 12.0 06/03/2018 1241   HGB 8.2 (L) 11/14/2016 1221   HCT 33.4 (L) 06/03/2018 1241   HCT 24.7 (L) 11/14/2016 1221   PLT 265 06/03/2018 1241   PLT 277 11/14/2016 1221   MCV 76.4 (L) 06/03/2018 1241   MCV 73.5 (L) 11/14/2016 1221   MCH 27.5 06/03/2018 1241   MCHC 35.9 06/03/2018 1241   RDW 14.4 06/03/2018 1241   RDW 17.5 (H) 11/14/2016 1221   LYMPHSABS 1,971 06/03/2018 1241   LYMPHSABS 1.7 11/14/2016 1221   MONOABS 0.3 07/28/2017 1747   MONOABS 0.7 11/14/2016 1221   EOSABS 30 06/03/2018 1241   EOSABS 0.1 11/14/2016 1221   BASOSABS 12 06/03/2018 1241   BASOSABS 0.0 11/14/2016 1221    CMP     Component Value Date/Time   NA 141 11/26/2017 1535   NA 141 11/14/2016 1221   K 4.1 11/26/2017 1535   K 3.9 11/14/2016 1221   CL 109 11/26/2017 1535   CO2 25 11/26/2017 1535   CO2 25 11/14/2016 1221   GLUCOSE 93 11/26/2017 1535   GLUCOSE 100 11/14/2016 1221   BUN 6 (L) 11/26/2017 1535   BUN 8.3 11/14/2016 1221   CREATININE 0.59 11/26/2017 1535   CREATININE 0.7 11/14/2016 1221   CALCIUM 8.7 11/26/2017 1535   CALCIUM 8.9 11/14/2016 1221   PROT 5.9 (L) 11/26/2017 1535   PROT 6.7 11/14/2016 1221   ALBUMIN 3.4 (L) 11/21/2017 1646   ALBUMIN 3.5 11/14/2016 1221   AST 13 11/26/2017 1535   AST 11 11/14/2016 1221   ALT 12 11/26/2017 1535   ALT 10 11/14/2016 1221   ALKPHOS 37 (L) 11/21/2017 1646   ALKPHOS 64 11/14/2016 1221  BILITOT 0.3 11/26/2017 1535   BILITOT 0.42 11/14/2016 1221   GFRNONAA >60 11/22/2017 0656    GFRAA >60 11/22/2017 0656     ASSESSMENT and PLAN:   Malignant neoplasm of upper-inner quadrant of left female breast ([place]) 12/18/16: Left Lumpectomy: HG DCIS 1.2 cm, Margins Neg, 0/7 LN Neg; TisN0 (Stage 1 A) ER 95%, PR 95%  Adjuvant radiation therapy 01/21/2017- 03/06/2017   Current treatment: Antiestrogen therapy with tamoxifen 5 years, started 03/10/2017   Donna Matthews continues on tamoxifen with good tolerance.  Donna Matthews will continue this.  This is however not while Donna Matthews is here.  Donna Matthews is here to have her left breast evaluated.  Since Donna Matthews has a new area over her lumpectomy site that Donna Matthews has not felt before we will go ahead and get a left breast diagnostic mammogram and ultrasound.  I placed these orders to be completed at the breast center.  Donna Matthews will call us if Donna Matthews does not hear from them in the next 1 to 2 days to get this scheduled.  Donna Matthews has follow-up already scheduled in February of next year with Dr. Quintella Reichert.  Donna Matthews will keep this appointment.  We will adjust things if needed based on her imaging results.  Donna Matthews verbalized understanding with this plan.   Orders Placed This Encounter  Procedures   MM DIAG BREAST TOMO UNI LEFT    Standing Status:   Future    Standing Expiration Date:   10/26/2022    Order Specific Question:   Reason for Exam (SYMPTOM  OR DIAGNOSIS REQUIRED)    Answer:   nodule over lumpectomy scar, new per patient, please eval    Order Specific Question:   Preferred imaging location?    Answer:   [facility]    Order Specific Question:   Is the patient pregnant?    Answer:   No   US BREAST LTD UNI LEFT INC AXILLA    Standing Status:   Future    Standing Expiration Date:   10/26/2022    Order Specific Question:   Reason for Exam (SYMPTOM  OR DIAGNOSIS REQUIRED)    Answer:   nodule over lumpectomy scar, new per patient, please eval    Order Specific Question:   Preferred imaging location?    Answer:   [facility]    All questions were answered. The patient knows  to call the clinic with any problems, questions or concerns. We can certainly see the patient much sooner if necessary.   Total encounter time: 25 minutes in face-to-face visit time, chart review, lab review, care coordination, and documentation of the encounter.Wilber Bihari, NP 10/26/21 2:43 PM Medical Oncology and Hematology [facility] [place], [place] [address] Tel. [phone]    Fax. [phone]  *Total Encounter Time as defined by the Centers for Medicare and Medicaid Services includes, in addition to the face-to-face time of a patient visit (documented in the note above) non-face-to-face time: obtaining and reviewing outside history, ordering and reviewing medications, tests or procedures, care coordination (communications with other health care professionals or caregivers) and documentation in the medical record.

## 2021-10-25 NOTE — Assessment & Plan Note (Addendum)
12/18/16: Left Lumpectomy: HG DCIS 1.2 cm, Margins Neg, 0/7 LN Neg; TisN0 (Stage 1 A) ER 95%, PR 95% Adjuvant radiation therapy 01/21/2017- 03/06/2017  Current treatment: Antiestrogen therapy with tamoxifen 5 years, started 03/10/2017  Donna Matthews continues on tamoxifen with good tolerance.  She will continue this.  This is however not while she is here.  She is here to have her left breast evaluated.  Since she has a new area over her lumpectomy site that she has not felt before we will go ahead and get a left breast diagnostic mammogram and ultrasound.  I placed these orders to be completed at the breast center.  She will call us if she does not hear from them in the next 1 to 2 days to get this scheduled.  She has follow-up already scheduled in February of next year with Dr. Quintella Reichert.  She will keep this appointment.  We will adjust things if needed based on her imaging results.  Sharyn Lull verbalized understanding with this plan.

## 2021-10-26 ENCOUNTER — Inpatient Hospital Stay: Payer: No Typology Code available for payment source | Attending: Adult Health | Admitting: Adult Health

## 2021-10-26 ENCOUNTER — Other Ambulatory Visit: Payer: Self-pay

## 2021-10-26 ENCOUNTER — Encounter: Payer: Self-pay | Admitting: Adult Health

## 2021-10-26 VITALS — BP 152/92 | HR 79 | Temp 98.1°F | Resp 16 | Ht 63.0 in | Wt 196.8 lb

## 2021-10-26 DIAGNOSIS — Z170 Estrogen receptor positive status [ER+]: Secondary | ICD-10-CM | POA: Insufficient documentation

## 2021-10-26 DIAGNOSIS — Z79810 Long term (current) use of selective estrogen receptor modulators (SERMs): Secondary | ICD-10-CM | POA: Insufficient documentation

## 2021-10-26 DIAGNOSIS — C50212 Malignant neoplasm of upper-inner quadrant of left female breast: Secondary | ICD-10-CM | POA: Insufficient documentation

## 2021-10-26 DIAGNOSIS — Z923 Personal history of irradiation: Secondary | ICD-10-CM | POA: Diagnosis not present

## 2021-10-31 ENCOUNTER — Telehealth: Payer: Self-pay

## 2021-10-31 NOTE — Telephone Encounter (Signed)
Called pt to inform her of appointment at [facility] for mammogram and ultrasound 12/21 @ 3pm.  Pt verbalized thanks

## 2021-11-29 ENCOUNTER — Ambulatory Visit
Admission: RE | Admit: 2021-11-29 | Discharge: 2021-11-29 | Disposition: A | Discharge status: Home / Self Care | Payer: No Typology Code available for payment source | Source: Ambulatory Visit | Attending: Adult Health | Admitting: Adult Health

## 2021-11-29 DIAGNOSIS — C50212 Malignant neoplasm of upper-inner quadrant of left female breast: Secondary | ICD-10-CM

## 2021-11-29 DIAGNOSIS — Z170 Estrogen receptor positive status [ER+]: Secondary | ICD-10-CM

## 2022-01-09 ENCOUNTER — Other Ambulatory Visit (HOSPITAL_COMMUNITY)
Admission: RE | Admit: 2022-01-09 | Discharge: 2022-01-09 | Disposition: A | Discharge status: Home / Self Care | Payer: No Typology Code available for payment source | Source: Ambulatory Visit | Attending: Nurse Practitioner | Admitting: Nurse Practitioner

## 2022-01-09 ENCOUNTER — Encounter: Payer: Self-pay | Admitting: Nurse Practitioner

## 2022-01-09 ENCOUNTER — Other Ambulatory Visit: Payer: Self-pay

## 2022-01-09 ENCOUNTER — Ambulatory Visit (INDEPENDENT_AMBULATORY_CARE_PROVIDER_SITE_OTHER): Payer: No Typology Code available for payment source | Admitting: Nurse Practitioner

## 2022-01-09 VITALS — BP 126/80 | Ht 64.0 in | Wt 196.0 lb

## 2022-01-09 DIAGNOSIS — Z113 Encounter for screening for infections with a predominantly sexual mode of transmission: Secondary | ICD-10-CM | POA: Diagnosis present

## 2022-01-09 DIAGNOSIS — Z01419 Encounter for gynecological examination (general) (routine) without abnormal findings: Secondary | ICD-10-CM | POA: Diagnosis present

## 2022-01-09 DIAGNOSIS — Z853 Personal history of malignant neoplasm of breast: Secondary | ICD-10-CM

## 2022-01-09 NOTE — Progress Notes (Signed)
Donna Matthews [birth year] [id number]   History:  46 y.o. G2P0020 presents for annual exam. Irregular cycles, this is not new for her but she does feel they are increasing in flow and some months last longer. 2019 myomectomy with improvement of menorrhagia. 2017 left breast cancer managed with lumpectomy, radiation and antiestrogen therapy started in 2018 for 5 year duration (follow up next month). BRCA negative. 1998 LGSIL, subsequent paps normal. Would like STD screening today.   Gynecologic History Patient's last menstrual period was 12/22/2021. Period Cycle (Days): 28 (Occas skip a month) Period Duration (Days): 7 Period Pattern: Regular Menstrual Flow: Moderate Dysmenorrhea: None Contraception/Family planning: none Sexually active: No  Health Maintenance Last Pap: 12/02/2017. Results were: Normal, 5-year repeat Last mammogram: 07/07/2021. Results were: Stable left breast calcifications. Left U/S showed oil cyst within surgical scar 11/2021 Last Colonoscopy: 05/2021 per patient. Results were: Normal, 10-year recall Last Dexa: Not indicated  Past medical history, past surgical history, family history and social history were all reviewed and documented in the EPIC chart. Single. Works in [employer] for [employer].   ROS:  A ROS was performed and pertinent positives and negatives are included.  Exam:  Vitals:   01/09/22 1405  BP: 126/80  Weight: 196 lb (88.9 kg)  Height: 5' 4"  (1.626 m)    Body mass index is 33.64 kg/m.  General appearance:  Normal Thyroid:  Symmetrical, normal in size, without palpable masses or nodularity. Respiratory  Auscultation:  Clear without wheezing or rhonchi Cardiovascular  Auscultation:  Regular rate, without rubs, murmurs or gallops  Edema/varicosities:  Not grossly evident Abdominal  Soft,nontender, without masses, guarding or rebound.  Liver/spleen:  No organomegaly noted  Hernia:  None appreciated  Skin  Inspection:  Grossly  normal   Breasts: Examined lying and sitting.   Right: Without masses, retractions, discharge or axillary adenopathy.   Left: Without masses, retractions, discharge or axillary adenopathy. Old lumpectomy scar, cyst palpable on incision (previously seen on imaging) Genitourinary   Inguinal/mons:  Normal without inguinal adenopathy  External genitalia:  Normal appearing vulva with no masses, tenderness, or lesions  BUS/Urethra/Skene's glands:  Normal  Vagina:  Normal appearing with normal color and discharge, no lesions. Atrophic changes. Adolescent speculum used due to discomfort  Cervix:  Normal appearing without discharge or lesions  Uterus:  Normal in size, shape and contour.  Midline and mobile, nontender  Adnexa/parametria:     Rt: Normal in size, without masses or tenderness.   Lt: Normal in size, without masses or tenderness.  Anus and perineum: Normal  Digital rectal exam: Deferred  Patient informed chaperone available to be present for breast and pelvic exam. Patient has requested no chaperone to be present. Patient has been advised what will be completed during breast and pelvic exam.   Assessment/Plan:  46 y.o. G2P0020 for annual exam.   Well female exam with routine gynecological exam - Plan: Cytology - PAP( [place]). Education provided on SBEs, importance of preventative screenings, current guidelines, high calcium diet, regular exercise, and multivitamin daily.  Labs recently with PCP.   History of cancer of left breast - 2017 left breast cancer managed with lumpectomy, radiation and antiestrogen therapy started in 2018 for 5 year duration (follow up next month). BRCA negative. Continue annual screenings. Normal breast exam today.  Screen for STD (sexually transmitted disease) - Plan: Cytology - PAP( [place]), RPR, HIV Antibody (routine testing w rflx). STD panel pending.   Screening for cervical cancer - 1998 LGSIL,  subsequent paps normal. Pap today.   Screening  for colon cancer - Normal colonoscopy June 2022. Will repeat at 10-year interval per GI's recommendation.   Follow up in 1 year for annual.       Tamela Gammon [facility], 2:22 PM 01/09/2022

## 2022-01-10 LAB — RPR: RPR Ser Ql: NONREACTIVE

## 2022-01-10 LAB — HIV ANTIBODY (ROUTINE TESTING W REFLEX): HIV 1&2 Ab, 4th Generation: NONREACTIVE

## 2022-01-11 LAB — CYTOLOGY - PAP
Chlamydia: NEGATIVE
Comment: NEGATIVE
Comment: NEGATIVE
Comment: NEGATIVE
Comment: NORMAL
Diagnosis: NEGATIVE
Diagnosis: REACTIVE
High risk HPV: NEGATIVE
Neisseria Gonorrhea: NEGATIVE
Trichomonas: NEGATIVE

## 2022-01-31 NOTE — Assessment & Plan Note (Signed)
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 No further issues  I stressed with her the importance of exercise daily to reduce breast cancer risk.  Breast Cancer Surveillance: 1. Breast exam2/23/23:Benign 2. Mammogram12/21/22:  Benign , breast density category B  Return to clinic in1 yearfor follow-up

## 2022-01-31 NOTE — Progress Notes (Signed)
Patient Care Team: [name], PA as PCP - General (Family Medicine) [name], MD as Consulting Physician (General Surgery) Nicholas Lose, MD as Consulting Physician (Hematology and Oncology) Kyung Rudd, MD as Consulting Physician (Radiation Oncology) Gardenia Phlegm, NP as Nurse Practitioner (Hematology and Oncology)  DIAGNOSIS:    ICD-10-CM   1. Malignant neoplasm of upper-inner quadrant of left breast in female, estrogen receptor positive (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  The Hereditary Gene Panel offered by [facility] includes sequencing and/or deletion duplication testing of the following 43 genes: APC, ATM, AXIN2, BARD1, BMPR1A, BRCA1, BRCA2, BRIP1, CDH1, CDKN2A (p14ARF), CDKN2A (p16INK4a), CHEK2, DICER1, EPCAM (Deletion/duplication testing only), GREM1 (promoter region deletion/duplication testing only), KIT, MEN1, MLH1, MSH2, MSH6, MUTYH, NBN, NF1, PALB2, PDGFRA, PMS2, POLD1, POLE, PTEN, RAD50, RAD51C, RAD51D, SDHB, SDHC, SDHD, SMAD4, SMARCA4. STK11, TP53, TSC1, TSC2, and VHL.  The following gene was evaluated for sequence changes only: SDHA and HOXB13 c.251G>A.  The report date is December 02, 2016.   12/18/2016 Surgery   Left Lumpectomy: HG DCIS 1.2 cm, Margins Neg, 0/7 LN Neg; TisN0 (Stage 0)   01/21/2017 - 03/06/2017 Radiation Therapy   Adjuvant radiation therapy [facility]): 1) Left Breast: 50.4 Gy in 28 fractions.  2) Left Breast Boost: 10 Gy in 5 fractions.   04/09/2017 -  Anti-estrogen oral therapy   Tamoxifen 20 mg daily    01/27/2018 Surgery   Myomectomy (fibroids)     CHIEF COMPLIANT: Follow-up of left breast DCIS on tamoxifen therapy   INTERVAL HISTORY: Donna Matthews is a 46 y.o. with above-mentioned history of left breast DCIS treated with lumpectomy, radiation, and who is currently on tamoxifen. Mammogram on 11/29/2021 showed no evidence of malignancy in the left breast. She presents to the clinic today for annual follow-up.  She finishes 5 years of tamoxifen therapy by May 2023.  Her uterine bleeding has subsided since the fibroids were removed.  She gets occasional hair loss because of tamoxifen.  Mild to moderate hot flashes.  Her gynecologist is watching her very closely.  ALLERGIES:  is allergic to penicillins and bee venom.  MEDICATIONS:  Current Outpatient Medications  Medication Sig Dispense Refill   acetaminophen (TYLENOL) 500 MG tablet Take 500-1,000 mg by mouth every 6 (six) hours as needed (FOR PAIN).     Biotin 1 MG CAPS Take 1 mg by mouth daily.     Cholecalciferol (VITAMIN D3) 5000 units CAPS Take 5,000 Units by mouth daily.     Ferrous Sulfate (IRON) 325 (65 Fe) MG TABS Take 1 tablet by mouth 3 (three) times daily. 30 each 0   ibuprofen (ADVIL,MOTRIN) 800 MG tablet Take 1 tablet (800 mg total) by mouth every 8 (eight) hours. 60 tablet 0   Multiple Vitamins-Minerals (ADULT GUMMY PO) Take 2 tablets by mouth daily.     nystatin-triamcinolone ointment (MYCOLOG) Apply 1 application topically 2 (two) times daily. Apply thin layer to external vagina. (Patient not taking: Reported on 01/09/2022) 30 g 0   tamoxifen (NOLVADEX) 20 MG tablet Take 1 tablet (  20 mg total) by mouth daily at 2 PM. 90 tablet 3   No current facility-administered medications for this visit.    PHYSICAL EXAMINATION: ECOG PERFORMANCE STATUS: 1 - Symptomatic but completely ambulatory  Vitals:   02/01/22 0851  BP: (!) 141/83  Pulse: 81  Resp: 17  Temp: (!) 97.3 F (36.3 C)  SpO2: 100%   Filed Weights   02/01/22 0851   Weight: 196 lb 8 oz (89.1 kg)    BREAST: No palpable masses or nodules in either right or left breasts. No palpable axillary supraclavicular or infraclavicular adenopathy no breast tenderness or nipple discharge. (exam performed in the presence of a chaperone)  LABORATORY DATA:  I have reviewed the data as listed CMP Latest Ref Rng & Units 11/26/2017 11/22/2017 11/21/2017  Glucose 65 - 99 mg/dL 93 113(H) 117(H)  BUN 7 - 25 mg/dL 6(L) 10 13  Creatinine 0.50 - 1.10 mg/dL 0.59 0.51 0.49  Sodium 135 - 146 mmol/L 141 139 141  Potassium 3.5 - 5.3 mmol/L 4.1 3.3(L) 3.9  Chloride 98 - 110 mmol/L 109 109 112(H)  CO2 20 - 32 mmol/L _0 Calcium 8.6 - 10.2 mg/dL 8.7 7.4(L) 8.3(L)  Total Protein 6.1 - 8.1 g/dL 5.9(L) - 6.0(L)  Total Bilirubin 0.2 - 1.2 mg/dL 0.3 - 0.6  Alkaline Phos 38 - 126 U/L - - 37(L)  AST 10 - 30 U/L 13 - 17  ALT 6 - 29 U/L 12 - 14    Lab Results  Component Value Date   WBC 5.9 06/03/2018   HGB 12.0 06/03/2018   HCT 33.4 (L) 06/03/2018   MCV 76.4 (L) 06/03/2018   PLT 265 06/03/2018   NEUTROABS 3,280 06/03/2018    ASSESSMENT & PLAN:  Malignant neoplasm of upper-inner quadrant of left female breast ([place]) 12/18/16: Left Lumpectomy: HG DCIS 1.2 cm, Margins Neg, 0/7 LN Neg; TisN0 (Stage 1 A) ER 95%, PR 95%  Adjuvant radiation therapy 01/21/2017- 03/06/2017   Current treatment: Antiestrogen therapy with tamoxifen 5 years, started 03/10/2017   Tamoxifen Toxicities: 1. Intermittent hot flashes 2. occasional muscle cramps 3. Heavy menstrual bleeding: Status post myomectomy.  No further issues Currently takes iron and is being watched by [facility] physicians   Breast Cancer Surveillance: 1. Breast exam 02/01/22: Benign 2. Mammogram 11/29/21: Ultrasound revealed a cyst, benign , breast density category B   Return to clinic on an as-needed basis.    No orders of the defined types were placed in this encounter.  The patient has a good understanding of the overall  plan. she agrees with it. she will call with any problems that may develop before the next visit here.  Total time spent: 20 mins including face to face time and time spent for planning, charting and coordination of care  Rulon Eisenmenger, MD, MPH 02/01/2022  I, Thana Ates, am acting as scribe for Dr. Nicholas Lose.  I have reviewed the above documentation for accuracy and completeness, and I agree with the above.

## 2022-02-01 ENCOUNTER — Other Ambulatory Visit: Payer: Self-pay

## 2022-02-01 ENCOUNTER — Inpatient Hospital Stay: Payer: No Typology Code available for payment source | Attending: Adult Health | Admitting: Hematology and Oncology

## 2022-02-01 DIAGNOSIS — N951 Menopausal and female climacteric states: Secondary | ICD-10-CM | POA: Diagnosis not present

## 2022-02-01 DIAGNOSIS — R232 Flushing: Secondary | ICD-10-CM | POA: Insufficient documentation

## 2022-02-01 DIAGNOSIS — Z923 Personal history of irradiation: Secondary | ICD-10-CM | POA: Diagnosis not present

## 2022-02-01 DIAGNOSIS — N920 Excessive and frequent menstruation with regular cycle: Secondary | ICD-10-CM | POA: Insufficient documentation

## 2022-02-01 DIAGNOSIS — Z79899 Other long term (current) drug therapy: Secondary | ICD-10-CM | POA: Diagnosis not present

## 2022-02-01 DIAGNOSIS — C50212 Malignant neoplasm of upper-inner quadrant of left female breast: Secondary | ICD-10-CM | POA: Insufficient documentation

## 2022-02-01 DIAGNOSIS — Z79810 Long term (current) use of selective estrogen receptor modulators (SERMs): Secondary | ICD-10-CM | POA: Insufficient documentation

## 2022-02-01 DIAGNOSIS — Z170 Estrogen receptor positive status [ER+]: Secondary | ICD-10-CM | POA: Diagnosis not present

## 2022-02-01 DIAGNOSIS — R252 Cramp and spasm: Secondary | ICD-10-CM | POA: Insufficient documentation

## 2022-02-08 ENCOUNTER — Other Ambulatory Visit: Payer: Self-pay | Admitting: Hematology and Oncology

## 2022-07-02 ENCOUNTER — Other Ambulatory Visit: Payer: Self-pay | Admitting: Hematology and Oncology

## 2022-07-02 DIAGNOSIS — Z853 Personal history of malignant neoplasm of breast: Secondary | ICD-10-CM

## 2022-07-31 ENCOUNTER — Ambulatory Visit
Admission: RE | Admit: 2022-07-31 | Discharge: 2022-07-31 | Disposition: A | Discharge status: Home / Self Care | Payer: No Typology Code available for payment source | Source: Ambulatory Visit | Attending: Hematology and Oncology | Admitting: Hematology and Oncology

## 2022-07-31 DIAGNOSIS — Z853 Personal history of malignant neoplasm of breast: Secondary | ICD-10-CM

## 2022-09-14 ENCOUNTER — Other Ambulatory Visit: Payer: Self-pay | Admitting: Family Medicine

## 2022-09-14 DIAGNOSIS — N644 Mastodynia: Secondary | ICD-10-CM

## 2022-09-14 DIAGNOSIS — Z853 Personal history of malignant neoplasm of breast: Secondary | ICD-10-CM

## 2022-10-03 ENCOUNTER — Ambulatory Visit: Admission: RE | Admit: 2022-10-03 | Payer: No Typology Code available for payment source | Source: Ambulatory Visit

## 2022-10-03 ENCOUNTER — Ambulatory Visit: Payer: No Typology Code available for payment source

## 2022-10-03 ENCOUNTER — Ambulatory Visit
Admission: RE | Admit: 2022-10-03 | Discharge: 2022-10-03 | Disposition: A | Discharge status: Home / Self Care | Payer: No Typology Code available for payment source | Source: Ambulatory Visit | Attending: Family Medicine | Admitting: Family Medicine

## 2022-10-03 DIAGNOSIS — N644 Mastodynia: Secondary | ICD-10-CM

## 2022-10-03 DIAGNOSIS — Z853 Personal history of malignant neoplasm of breast: Secondary | ICD-10-CM

## 2022-10-03 HISTORY — DX: Encounter for nonprocreative screening for genetic disease carrier status: Z13.71

## 2023-01-30 ENCOUNTER — Ambulatory Visit: Payer: No Typology Code available for payment source | Admitting: Nurse Practitioner

## 2023-02-19 ENCOUNTER — Encounter: Payer: Self-pay | Admitting: Nurse Practitioner

## 2023-02-26 ENCOUNTER — Ambulatory Visit (INDEPENDENT_AMBULATORY_CARE_PROVIDER_SITE_OTHER): Payer: No Typology Code available for payment source | Admitting: Nurse Practitioner

## 2023-02-26 ENCOUNTER — Encounter: Payer: Self-pay | Admitting: Nurse Practitioner

## 2023-02-26 VITALS — BP 124/82 | HR 71 | Ht 64.5 in | Wt 189.0 lb

## 2023-02-26 DIAGNOSIS — Z853 Personal history of malignant neoplasm of breast: Secondary | ICD-10-CM

## 2023-02-26 DIAGNOSIS — Z113 Encounter for screening for infections with a predominantly sexual mode of transmission: Secondary | ICD-10-CM

## 2023-02-26 DIAGNOSIS — Z01419 Encounter for gynecological examination (general) (routine) without abnormal findings: Secondary | ICD-10-CM | POA: Diagnosis not present

## 2023-02-26 NOTE — Progress Notes (Signed)
AROURA APPLEYARD 09-Mar-1976 [id number]   History:  47 y.o. G2P0020 presents for annual exam. H/O irregular menses. Menses were heavier while on Tamoxifen, they have improved some since discontinuing. 2019 myomectomy with improvement of menorrhagia. 2017 left breast cancer managed with lumpectomy, radiation and antiestrogen therapy started in 2018 x 5 years. BRCA negative. 1998 LGSIL, subsequent paps normal. Would like STD screening.   Gynecologic History Patient's last menstrual period was 02/23/2023 (approximate). Period Cycle (Days):  (21-30) Period Duration (Days): 6-7 Menstrual Flow:  (heavy in beginning then lightens) Menstrual Control: Maxi pad Dysmenorrhea: (!) Mild Dysmenorrhea Symptoms: Cramping Contraception/Family planning: none Sexually active: Yes  Health Maintenance Last Pap: 01/09/2022. Results were: Normal neg HPV, 5-year repeat Last mammogram: 10/03/2022. Results were: Normal Last Colonoscopy: 05/2021 per patient. Results were: Normal, 10-year recall Last Dexa: Not indicated  Past medical history, past surgical history, family history and social history were all reviewed and documented in the EPIC chart. Works in [employer] for [employer].   ROS:  A ROS was performed and pertinent positives and negatives are included.  Exam:  Vitals:   02/26/23 0810  BP: 124/82  Pulse: 71  SpO2: 98%  Weight: 189 lb (85.7 kg)  Height: 5' 4.5" (1.638 m)     Body mass index is 31.94 kg/m.  General appearance:  Normal Thyroid:  Symmetrical, normal in size, without palpable masses or nodularity. Respiratory  Auscultation:  Clear without wheezing or rhonchi Cardiovascular  Auscultation:  Regular rate, without rubs, murmurs or gallops  Edema/varicosities:  Not grossly evident Abdominal  Soft,nontender, without masses, guarding or rebound.  Liver/spleen:  No organomegaly noted  Hernia:  None appreciated  Skin  Inspection:  Grossly normal   Breasts: Examined lying and  sitting.   Right: Without masses, retractions, discharge or axillary adenopathy.   Left: Without masses, retractions, discharge or axillary adenopathy. Old lumpectomy scar Genitourinary   Inguinal/mons:  Normal without inguinal adenopathy  External genitalia:  Normal appearing vulva with no masses, tenderness, or lesions  BUS/Urethra/Skene's glands:  Normal  Vagina:  Normal appearing with normal color and discharge, no lesions  Cervix:  Normal appearing without discharge or lesions  Uterus:  Normal in size, shape and contour.  Midline and mobile, nontender  Adnexa/parametria:     Rt: Normal in size, without masses or tenderness.   Lt: Normal in size, without masses or tenderness.  Anus and perineum: Normal  Digital rectal exam: Deferred  Patient informed chaperone available to be present for breast and pelvic exam. Patient has requested no chaperone to be present. Patient has been advised what will be completed during breast and pelvic exam.   Assessment/Plan:  47 y.o. G2P0020 for annual exam.   Well female exam with routine gynecological exam - Education provided on SBEs, importance of preventative screenings, current guidelines, high calcium diet, regular exercise, and multivitamin daily.  Labs with PCP.   History of cancer of left breast - 2017 left breast cancer managed with lumpectomy, radiation and antiestrogen therapy started in 2018 x 5 years BRCA negative. Continue annual screenings. Normal breast exam today.  Screening examination for STD (sexually transmitted disease) - Plan: RPR, HIV Antibody (routine testing w rflx), SURESWAB CT/NG/T. vaginalis  Screening for cervical cancer - 1998 LGSIL, subsequent paps normal. Will repeat at 5-year interval per guidelines.   Screening for colon cancer - Normal colonoscopy June 2022. Will repeat at 10-year interval per GI's recommendation.   Follow up in 1 year for annual.  Tamela Gammon [facility], 8:29 AM 02/26/2023

## 2023-02-27 LAB — SURESWAB CT/NG/T. VAGINALIS
C. trachomatis RNA, TMA: NOT DETECTED
N. gonorrhoeae RNA, TMA: NOT DETECTED
Trichomonas vaginalis RNA: NOT DETECTED

## 2023-02-27 LAB — RPR: RPR Ser Ql: NONREACTIVE

## 2023-02-27 LAB — HIV ANTIBODY (ROUTINE TESTING W REFLEX): HIV 1&2 Ab, 4th Generation: NONREACTIVE

## 2023-02-28 ENCOUNTER — Telehealth: Payer: Self-pay

## 2023-02-28 NOTE — Telephone Encounter (Signed)
Patient inquired about her u/a results. I explained u/a not performed. She said she mentioned she wanted this performed to the nurse and the provider several times. She is not having any UTI sx. She said she has been diagnosed before with UTI when she was asymptomatic and just wanted it test to be sure she did not have UTI.  I offered to check with TW for order for u/a w culture reflex and will call her back if okay to schedule lab appt to come by and leave a speciment.

## 2023-02-28 NOTE — Telephone Encounter (Signed)
Patient is asymptomatic and she is fine with not having u/a checked.

## 2023-02-28 NOTE — Telephone Encounter (Signed)
Please let her know we do not check a urinalysis unless symptoms are present as we do not treat if asymptomatic. 30% of UTIs seen on urinalysis resolve on their own and do not require antibiotic treatment. If she is adamant on having checked then she can leave sample at he convenience.

## 2023-02-28 NOTE — Telephone Encounter (Signed)
-----   Message from Tamela Gammon, NP sent at 02/28/2023  7:51 AM EDT ----- Please let Donna Matthews know her STD screening is negative.

## 2023-05-23 ENCOUNTER — Ambulatory Visit (INDEPENDENT_AMBULATORY_CARE_PROVIDER_SITE_OTHER): Payer: No Typology Code available for payment source | Admitting: Radiology

## 2023-05-23 VITALS — BP 124/76

## 2023-05-23 DIAGNOSIS — R102 Pelvic and perineal pain: Secondary | ICD-10-CM | POA: Diagnosis not present

## 2023-05-23 DIAGNOSIS — Z113 Encounter for screening for infections with a predominantly sexual mode of transmission: Secondary | ICD-10-CM | POA: Diagnosis not present

## 2023-05-23 DIAGNOSIS — N898 Other specified noninflammatory disorders of vagina: Secondary | ICD-10-CM | POA: Diagnosis not present

## 2023-05-23 LAB — WET PREP FOR TRICH, YEAST, CLUE

## 2023-05-23 NOTE — Progress Notes (Signed)
      Subjective: Donna Matthews is a 47 y.o. female who complains of vaginal irritation x's 3 days, no discharge, upper back pain x's 2 weeks. No urinary or bowel changes. No pelvic or abdominal pain.   Review of Systems  Constitutional: Negative.   Respiratory: Negative.    Cardiovascular: Negative.   Gastrointestinal: Negative.   Genitourinary: Negative.   Musculoskeletal:  Positive for back pain.    Past Medical History:  Diagnosis Date   Anemia    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    Breast cancer (HCC) 10/2016   Ectopic pregnancy    Family history of melanoma    Family history of stomach cancer    Fibroids    GERD (gastroesophageal reflux disease)    Personal history of radiation therapy    PONV (postoperative nausea and vomiting)    STD (sexually transmitted disease)    Chlamydia      Objective:  Today's Vitals   05/23/23 0928  BP: 124/76   There is no height or weight on file to calculate BMI.   Physical Exam Constitutional:      Appearance: Normal appearance. She is normal weight.  Pulmonary:     Effort: Pulmonary effort is normal.  Abdominal:     General: Abdomen is flat.     Palpations: Abdomen is soft.  Genitourinary:    Pubic Area: No rash.      Labia:        Right: No rash or lesion.        Left: No rash or lesion.      Vagina: Normal.     Cervix: Normal.     Uterus: Normal.      Adnexa: Right adnexa normal and left adnexa normal.  Musculoskeletal:     Cervical back: No tenderness.     Thoracic back: Tenderness present.     Lumbar back: No tenderness.       Back:  Skin:    General: Skin is warm and dry.  Neurological:     Mental Status: She is alert.  Psychiatric:        Mood and Affect: Mood normal.        Thought Content: Thought content normal.        Judgment: Judgment normal.      Urine dipstick shows positive for RBC's.  Micro exam: 3-10 RBC's per HPF and few+ bacteria.  Microscopic wet-mount exam shows negative for  pathogens, normal epithelial cells.   Raynelle Fanning, CMA present for exam  Assessment:/Plan:   1. Back pain - Urinalysis,Complete w/RFL Culture - Pregnancy, urine - Follow up with PCP re: upper back pain  2. Screening for STDs (sexually transmitted diseases) - SURESWAB CT/NG/T. vaginalis  3. Vaginal irritation Reassured negative wet prep Coconut oil externally to soothe irritated area - WET PREP FOR TRICH, YEAST, CLUE    Will contact patient with results of testing completed today. Avoid intercourse until symptoms are resolved. Safe sex encouraged. Avoid the use of soaps or perfumed products in the peri area. Avoid tub baths and sitting in sweaty or wet clothing for prolonged periods of time.

## 2023-05-24 LAB — SURESWAB CT/NG/T. VAGINALIS
C. trachomatis RNA, TMA: NOT DETECTED
N. gonorrhoeae RNA, TMA: NOT DETECTED
Trichomonas vaginalis RNA: NOT DETECTED

## 2023-05-25 LAB — URINALYSIS, COMPLETE W/RFL CULTURE
Bilirubin Urine: NEGATIVE
Glucose, UA: NEGATIVE
Hyaline Cast: NONE SEEN /LPF
Ketones, ur: NEGATIVE
Leukocyte Esterase: NEGATIVE
Nitrites, Initial: NEGATIVE
Protein, ur: NEGATIVE
Specific Gravity, Urine: 1.025 (ref 1.001–1.035)
WBC, UA: NONE SEEN /HPF (ref 0–5)
pH: 5.5 (ref 5.0–8.0)

## 2023-05-25 LAB — PREGNANCY, URINE: Preg Test, Ur: NEGATIVE

## 2023-05-25 LAB — URINE CULTURE
MICRO NUMBER:: 15078704
SPECIMEN QUALITY:: ADEQUATE

## 2023-05-25 LAB — CULTURE INDICATED

## 2023-05-30 ENCOUNTER — Telehealth: Payer: Self-pay

## 2023-05-30 NOTE — Telephone Encounter (Signed)
Follow up with PCP

## 2023-05-30 NOTE — Telephone Encounter (Signed)
Pt notified and voiced understanding. Confirmed pt's PCP is correct in EMR and will route her recent lab results to them. Will route to provider for final review and close.

## 2023-05-30 NOTE — Telephone Encounter (Signed)
Pt LVM in triage line requesting results of lab results done on 05/23/23.   Spoke w/ pt and notified her of normal/negative results. However, pt wanted to confirm that pt should f/u w/ PCP since she is having the back pain/blood in urine? Or should see see uro? Please advise.

## 2023-07-12 ENCOUNTER — Other Ambulatory Visit: Payer: Self-pay | Admitting: Family Medicine

## 2023-07-12 DIAGNOSIS — Z1231 Encounter for screening mammogram for malignant neoplasm of breast: Secondary | ICD-10-CM

## 2023-08-02 ENCOUNTER — Ambulatory Visit
Admission: RE | Admit: 2023-08-02 | Discharge: 2023-08-02 | Disposition: A | Discharge status: Home / Self Care | Payer: No Typology Code available for payment source | Source: Ambulatory Visit | Attending: Family Medicine | Admitting: Family Medicine

## 2023-08-02 DIAGNOSIS — Z1231 Encounter for screening mammogram for malignant neoplasm of breast: Secondary | ICD-10-CM

## 2023-08-07 ENCOUNTER — Other Ambulatory Visit: Payer: Self-pay | Admitting: Family Medicine

## 2023-08-07 DIAGNOSIS — R928 Other abnormal and inconclusive findings on diagnostic imaging of breast: Secondary | ICD-10-CM

## 2023-08-16 ENCOUNTER — Ambulatory Visit
Admission: RE | Admit: 2023-08-16 | Discharge: 2023-08-16 | Disposition: A | Discharge status: Home / Self Care | Payer: No Typology Code available for payment source | Source: Ambulatory Visit | Attending: Family Medicine | Admitting: Family Medicine

## 2023-08-16 ENCOUNTER — Other Ambulatory Visit: Payer: Self-pay | Admitting: Family Medicine

## 2023-08-16 DIAGNOSIS — R928 Other abnormal and inconclusive findings on diagnostic imaging of breast: Secondary | ICD-10-CM

## 2023-08-16 DIAGNOSIS — N6310 Unspecified lump in the right breast, unspecified quadrant: Secondary | ICD-10-CM

## 2023-08-20 ENCOUNTER — Ambulatory Visit
Admission: RE | Admit: 2023-08-20 | Discharge: 2023-08-20 | Disposition: A | Discharge status: Home / Self Care | Payer: No Typology Code available for payment source | Source: Ambulatory Visit | Attending: Family Medicine | Admitting: Family Medicine

## 2023-08-20 DIAGNOSIS — N6310 Unspecified lump in the right breast, unspecified quadrant: Secondary | ICD-10-CM

## 2023-08-20 DIAGNOSIS — R928 Other abnormal and inconclusive findings on diagnostic imaging of breast: Secondary | ICD-10-CM

## 2023-08-20 HISTORY — PX: BREAST BIOPSY: SHX20

## 2023-11-01 ENCOUNTER — Other Ambulatory Visit: Payer: Self-pay | Admitting: Family Medicine

## 2023-11-01 DIAGNOSIS — M549 Dorsalgia, unspecified: Secondary | ICD-10-CM

## 2023-11-01 DIAGNOSIS — R3129 Other microscopic hematuria: Secondary | ICD-10-CM

## 2023-11-01 DIAGNOSIS — R110 Nausea: Secondary | ICD-10-CM

## 2023-11-11 ENCOUNTER — Ambulatory Visit: Payer: No Typology Code available for payment source | Admitting: Dermatology

## 2023-11-11 ENCOUNTER — Encounter: Payer: Self-pay | Admitting: Dermatology

## 2023-11-11 VITALS — BP 129/78

## 2023-11-11 DIAGNOSIS — B079 Viral wart, unspecified: Secondary | ICD-10-CM | POA: Diagnosis not present

## 2023-11-11 DIAGNOSIS — L6681 Central centrifugal cicatricial alopecia: Secondary | ICD-10-CM | POA: Diagnosis not present

## 2023-11-11 MED ORDER — SAFETY SEAL MISCELLANEOUS MISC: 2 refills | Status: DC

## 2023-11-11 MED ORDER — TRIAMCINOLONE ACETONIDE 10 MG/ML IJ SUSP
10.0000 mg | Freq: Once | INTRAMUSCULAR | Status: AC
  Administered 2023-11-11: 10 mg

## 2023-11-11 NOTE — Progress Notes (Signed)
New Patient Visit   Subjective  Donna Matthews is a 47 y.o. female who presents for the following:   New Pt - Scalp Itching - Hair Loss  Patient states she has hair loss and itching located at the scalp that she would like to have examined. Patient reports the areas have been there for 1 year. She reports the areas are bothersome.Patient rates irritation (itchy) 2 out of 10. She states that the areas have spread. Patient reports she has previously been treated for these areas by dermatology in August and was prescribed Betamethasone 0.05% cream however she does not see a difference. Patient denies Hx of bx. Patient denies family history of skin cancer(s).  The following portions of the chart were reviewed this encounter and updated as appropriate: medications, allergies, medical history  Review of Systems:  No other skin or systemic complaints except as noted in HPI or Assessment and Plan.   Objective  Well appearing patient in no apparent distress; mood and affect are within normal limits.   A focused examination was performed of the following areas: hair and scalp   Relevant exam findings are noted in the Assessment and Plan.                      Assessment & Plan    Benedict Needy presented with hair thinning and an itchy scalp, primarily on the crown, with a history of wearing braids due to hair shedding from breast cancer medication. Examination revealed signs of scarring, hyperpigmentation, hypopigmentation, and perifollicular plugging. She was diagnosed with Central Centrifugal Cicatricial Alopecia (CCCA) and prescribed clobetasol combined with minoxidil, Kenalog injections, and recommended supplements. Additionally, filiform warts on her neck were treated with cryotherapy.  Central centrifugal cicatricial alopecia (CCCA) Exam:  Crown and vertex of scalp shows hair thinning with clinical signs of scarring with hyper- and hypo- pigmentation with some follicular  plugging. Mild thinning at the temples.  History of wearing braids for three years due to hair shedding from breast cancer medication. Previous treatment with betamethasone was ineffective. Ongoing hair thinning reported by the patient.  Treatment Plan: - Pt educated on the importance of consistent topical treatment to prevent reversing progress. - Minoxidil/Clobetasol (AA Gel) from [facility] Rx to use daily - apply to the temples and crown of head. -Recommended supplements - Viviscal and Vital Protein Collagen - pictures included in AVS.  -Kenalog injections done today   2. Filiform Warts - Assessment: Three verrucous tag-like papules identified on the right neck. - Plan: Perform cryotherapy with liquid nitrogen. Advise patient to apply Aquaphor to treated areas.   Central centrifugal cicatricial alopecia Mid Parietal Scalp  triamcinolone acetonide (KENALOG) 10 MG/ML injection 10 mg - Mid Parietal Scalp   Viral warts, unspecified type Right Anterior Neck  Destruction of lesion - Right Anterior Neck Complexity: simple   Destruction method: cryotherapy   Informed consent: discussed and consent obtained   Timeout:  patient name, date of birth, surgical site, and procedure verified Lesion destroyed using liquid nitrogen: Yes   Region frozen until ice ball extended beyond lesion: Yes   Outcome: patient tolerated procedure well with no complications   Post-procedure details: wound care instructions given       Return in about 4 months (around 03/11/2024) for CCCA .    Documentation: I have reviewed the above documentation for accuracy and completeness, and I agree with the above.   I, Shirron Marcha Solders, CMA, am acting as Neurosurgeon for [employer]  Jinny Sanders, DO

## 2023-11-11 NOTE — Patient Instructions (Addendum)
Hello Donna Matthews,  Thank you for visiting my office today. Your dedication to addressing your scalp and hair concerns is commendable, and I am here to support your journey towards better health. 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  - Medications:   - Topical Treatments: Apply a combination of clobetasol and minoxidil, formulated by [facility] compounding pharmacy, to the thinning areas, particularly the temples, in the morning to prevent unwanted hair growth.   - Injections: Receive Kenalog 10 injections, 0.6 cc, every four months, in alignment with the topical treatment application.  - Supplements:   - Complete your current supplement regimen before switching to Vital Proteins collagen.   - Vivascal vitamins are recommended for their affordability and effectiveness.  - Hair Care:   - Braids and wigs are okay; ensure they allow scalp access and use a satin band to minimize friction.   - Regularly change braid patterns to prevent tension and breakage.  - Follow-Up:   - We will take progress photos and schedule a follow-up appointment in four months.  - Additional Treatment:   - Treatment of filiform warts on the neck with liquid nitrogen. Post-treatment, care for these spots with Aquaphor.  Please adhere to the treatment plan consistently to mitigate further scarring and potentially encourage hair regrowth. The compounding pharmacy will be in touch soon to coordinate the delivery of your medication.  Should you have any questions or require further clarification on your treatment plan, do not hesitate to contact our office.  Warm regards,  Dr. Langston Reusing,  Dermatology                What is central centrifugal cicatricial  alopecia? Central Centrifugal Cicatricial Alopecia (CCCA) is a form of scarring alopecia on the scalp that results in permanent hair loss. It is the most common form of scarring hair loss seen in black women. However, it may be seen in men and among persons of all races and hair colour (though rarely). Middle-aged women are most commonly affected.  What is the cause of central centrifugal cicatricial alopecia? The exact cause of CCCA is unknown and is likely multifactorial. A genetic component has been suggested, with a link to mutations of the gene PADI3, which encodes peptidyl arginine deiminase, type III (PADI3), an enzyme that modifies proteins that are essential to formation of the hair-shaft. Hair care practices, such as the use of the hot comb, relaxers, tight extensions and weaves, have been implicated for decades, but studies have not shown a consistent link. Other proposed causative factors include fungal infections, bacterial infections, autoimmune disease, and genetics. One study has shown an association with medical conditions such as type 2 diabetes mellitus.  What patients often ask their dermatologist about CCCA Board-certified dermatologists are the medical doctors who have the most experience diagnosing and treating hair loss, including CCCA. When patients see their dermatologist about CCCA, they often ask the following questions.  Why am I going bald in the center of my head? This type of hair loss often begins in the center of the scalp as a small, balding, and round patch that grows over time.  While more common in Black women, this type of hair loss develops in men and people of all races.  Central centrifugal cicatricial alopecia (CCCA) The first sign of CCCA  is often noticeable hair loss in the center, or crown, of your scalp.  Woman with CCCA has hair loss in the center of her scalp If you have this type of hair loss, you want to treat it early. Starting treatment early can  prevent CCCA from spreading outward and causing more permanent hair loss. Some people also have hair regrowth when treatment starts early.  Early treatment is important because this disease destroys hair follicles. These are tiny pores (or openings) in your scalp from which your hair grows. Once a hair follicle has been destroyed, it is replaced by scar tissue. This is why hair loss can be permanent.  You can tell when scarring develops by looking at your scalp. After many hair follicles develop scars, you'll have a bald area that feels smooth to the touch.  Can CCCA be reversed? You may be able to reverse (or grow some hair) if you treat CCCA early before hair follicles develop scars. Once a hair follicle scars completely, treatment to regrow hair becomes difficult and hair loss is more likely to be permanent.  While treatment for CCCA may not always be able to reverse the disease and regrow hair, treatment can prevent CCCA from destroying more hair follicles. This means that the patch of hair loss that you have can remain the same size instead of getting larger. Without treatment, CCCA often continues to destroy hair follicles, and the patch of hair loss becomes larger and may eventually involve most of the scalp.  How is CCCA treated? You cannot effectively treat CCCA with hair loss treatments that you can buy online or in stores. A dermatologist must prescribe medication to treat this type of hair loss.  A dermatologist can also give you self-care tips that can make treatment more effective.  Even if you don't want to treat the hair loss, it's important to see a board-certified dermatologist if you have patchy hair loss in the center of your head. Occasionally, CCCA is a sign of a medical problem like a thyroid condition. The hair loss could also be a sign that you need more iron or certain vitamins.  If you have noticeable hair loss on the top of your head, dermatologists encourage you to make  an appointment today. As tempting as it can be to hide a small area of hair loss, remember that the small area tends to get larger and larger without treatment.  Why does the baldness start from the top of the head? Exactly why CCCA usually starts on the top of the head is not completely understood.  In studying CCCA, dermatologists have learned that it is a unique type of hair loss that usually starts on the top of the head. As CCCA progresses, the round patch grows.  Studies have also found that:  Where this type of hair loss develops, there's inflammation.  CCCA is the most common type of scarring hair loss for women of African descent.  In the [place], it's the most frequent cause of scarring hair loss in [place] American women and usually begins during middle age.  CCCA runs in families.  Noticeable hair loss is one sign of CCCA. Some people feel small, raised bumps on their scalp. Many people who have untreated CCCA say that their scalp burns, stings, or itches.  You may develop other signs or symptoms. You'll find more information about these, along with pictures at Central centrifugal cicatricial alopecia: Symptoms.     Important Information  Due to recent  changes in healthcare laws, you may see results of your pathology and/or laboratory studies on MyChart before the doctors have had a chance to review them. We understand that in some cases there may be results that are confusing or concerning to you. Please understand that not all results are received at the same time and often the doctors may need to interpret multiple results in order to provide you with the best plan of care or course of treatment. Therefore, we ask that you please give [place] 2 business days to thoroughly review all your results before contacting the office for clarification. Should we see a critical lab result, you will be contacted sooner.   If You Need Anything After Your Visit  If you have any  questions or concerns for your doctor, please call our main line at [phone] If no one answers, please leave a voicemail as directed and we will return your call as soon as possible. Messages left after 4 pm will be answered the following business day.   You may also send [place] a message via MyChart. We typically respond to MyChart messages within 1-2 business days.  For prescription refills, please ask your pharmacy to contact our office. Our fax number is [phone].  If you have an urgent issue when the clinic is closed that cannot wait until the next business day, you can page your doctor at the number below.    Please note that while we do our best to be available for urgent issues outside of office hours, we are not available 24/7.   If you have an urgent issue and are unable to reach [place], you may choose to seek medical care at your doctor's office, retail clinic, urgent care center, or emergency room.  If you have a medical emergency, please immediately call 911 or go to the emergency department. In the event of inclement weather, please call our main line at [phone] for an update on the status of any delays or closures.  Dermatology Medication Tips: Please keep the boxes that topical medications come in in order to help keep track of the instructions about where and how to use these. Pharmacies typically print the medication instructions only on the boxes and not directly on the medication tubes.   If your medication is too expensive, please contact our office at [phone] or send [place] a message through MyChart.   We are unable to tell what your co-pay for medications will be in advance as this is different depending on your insurance coverage. However, we may be able to find a substitute medication at lower cost or fill out paperwork to get insurance to cover a needed medication.   If a prior authorization is required to get your medication covered by your insurance company,  please allow [place] 1-2 business days to complete this process.  Drug prices often vary depending on where the prescription is filled and some pharmacies may offer cheaper prices.  The website www.goodrx.com contains coupons for medications through different pharmacies. The prices here do not account for what the cost may be with help from insurance (it may be cheaper with your insurance), but the website can give you the price if you did not use any insurance.  - You can print the associated coupon and take it with your prescription to the pharmacy.  - You may also stop by our office during regular business hours and pick up a GoodRx coupon card.  - If you need your prescription sent  electronically to a different pharmacy, notify our office through [facility] or by phone at [phone]   Cryotherapy Aftercare  Wash gently with soap and water everyday.   Apply Vaseline and Band-Aid daily until healed.

## 2023-11-18 ENCOUNTER — Ambulatory Visit
Admission: RE | Admit: 2023-11-18 | Discharge: 2023-11-18 | Disposition: A | Discharge status: Home / Self Care | Payer: No Typology Code available for payment source | Source: Ambulatory Visit | Attending: Family Medicine | Admitting: Family Medicine

## 2023-11-18 DIAGNOSIS — M549 Dorsalgia, unspecified: Secondary | ICD-10-CM

## 2023-11-18 DIAGNOSIS — R110 Nausea: Secondary | ICD-10-CM

## 2023-11-18 DIAGNOSIS — R3129 Other microscopic hematuria: Secondary | ICD-10-CM

## 2024-02-26 ENCOUNTER — Encounter: Payer: Self-pay | Admitting: Nurse Practitioner

## 2024-02-26 ENCOUNTER — Ambulatory Visit (INDEPENDENT_AMBULATORY_CARE_PROVIDER_SITE_OTHER): Payer: No Typology Code available for payment source | Admitting: Nurse Practitioner

## 2024-02-26 VITALS — BP 132/78 | HR 96 | Resp 12 | Ht 63.5 in | Wt 197.0 lb

## 2024-02-26 DIAGNOSIS — Z113 Encounter for screening for infections with a predominantly sexual mode of transmission: Secondary | ICD-10-CM | POA: Diagnosis not present

## 2024-02-26 DIAGNOSIS — N898 Other specified noninflammatory disorders of vagina: Secondary | ICD-10-CM

## 2024-02-26 DIAGNOSIS — Z853 Personal history of malignant neoplasm of breast: Secondary | ICD-10-CM

## 2024-02-26 DIAGNOSIS — Z01419 Encounter for gynecological examination (general) (routine) without abnormal findings: Secondary | ICD-10-CM

## 2024-02-26 NOTE — Progress Notes (Signed)
 Donna Matthews 23-Sep-1976 [id number]   History:  48 y.o. G2P0020 presents for annual exam. H/O irregular menses. Skipped a few months at the end of the year, this was the longest she has gone between menses. 2019 myomectomy with improvement of menorrhagia. 2017 left breast cancer managed with lumpectomy, radiation and antiestrogen therapy until 2023. BRCA negative. 1998 LGSIL, subsequent paps normal. Would like STD screening today. Has been experiencing back pain, microscopic hematuria, vaginal irritation since last summer with negative urinalyses and wet preps. Has appointment scheduled with urology. Seeing GI for GERD, nausea.   Gynecologic History Patient's last menstrual period was 02/20/2024. Period Pattern: (!) Irregular Menstrual Flow: Heavy Menstrual Control: Maxi pad, Panty liner Menstrual Control Change Freq (Hours): changes pad every hour Dysmenorrhea: None (back pain) Contraception/Family planning: none Sexually active: Not currently  Health Maintenance Last Pap: 01/09/2022. Results were: Normal neg HPV Last mammogram: 08/02/2023. Results were: Possible right breast mass, biopsy confirmed fibroadenoma Last Colonoscopy: 05/2021 per patient. Results were: Normal, 10-year recall Last Dexa: Not indicated  Past medical history, past surgical history, family history and social history were all reviewed and documented in the EPIC chart. Works in [employer] for [employer].   ROS:  A ROS was performed and pertinent positives and negatives are included.  Exam:  Vitals:   02/26/24 1529  BP: 132/78  Pulse: 96  Resp: 12  Weight: 197 lb (89.4 kg)  Height: 5' 3.5" (1.613 m)      Body mass index is 34.35 kg/m.  General appearance:  Normal Thyroid:  Symmetrical, normal in size, without palpable masses or nodularity. Respiratory  Auscultation:  Clear without wheezing or rhonchi Cardiovascular  Auscultation:  Regular rate, without rubs, murmurs or gallops  Edema/varicosities:   Not grossly evident Abdominal  Soft,nontender, without masses, guarding or rebound.  Liver/spleen:  No organomegaly noted  Hernia:  None appreciated  Skin  Inspection:  Grossly normal   Breasts: Examined lying and sitting.   Right: Without masses, retractions, discharge or axillary adenopathy.   Left: Without masses, retractions, discharge or axillary adenopathy.  Pelvic: External genitalia:  no lesions              Urethra:  normal appearing urethra with no masses, tenderness or lesions              Bartholins and Skenes: normal                 Vagina: normal appearing vagina with normal color and discharge, no lesions              Cervix: no lesions Bimanual Exam:  Uterus:  no masses or tenderness              Adnexa: no mass, fullness, tenderness              Rectovaginal: Deferred              Anus:  normal, no lesions  Patient informed chaperone available to be present for breast and pelvic exam. Patient has requested no chaperone to be present. Patient has been advised what will be completed during breast and pelvic exam.   Assessment/Plan:  48 y.o. G2P0020 for annual exam.   Well female exam with routine gynecological exam - Education provided on SBEs, importance of preventative screenings, current guidelines, high calcium diet, regular exercise, and multivitamin daily.  Labs with PCP.   History of cancer of left breast - 2017 left breast cancer managed with lumpectomy, radiation  and antiestrogen therapy until 2023. Continue annual screenings. Normal breast exam today.  Screening examination for STD (sexually transmitted disease) - Plan: RPR, HIV Antibody (routine testing w rflx), SureSwab Advanced Vaginitis Plus,TMA  Vaginal irritation - Plan: SureSwab Advanced Vaginitis Plus,TMA  Screening for cervical cancer - 1998 LGSIL, subsequent paps normal. Will repeat at 5-year interval per guidelines.   Screening for colon cancer - Normal colonoscopy June 2022. Will repeat at  10-year interval per GI's recommendation.   Return in about 1 year (around 02/25/2025) for Annual.      Olivia Mackie [facility], 3:53 PM 02/26/2024

## 2024-02-27 LAB — RPR: RPR Ser Ql: NONREACTIVE

## 2024-02-27 LAB — SURESWAB® ADVANCED VAGINITIS PLUS,TMA
C. trachomatis RNA, TMA: NOT DETECTED
CANDIDA SPECIES: NOT DETECTED
Candida glabrata: NOT DETECTED
N. gonorrhoeae RNA, TMA: NOT DETECTED
SURESWAB(R) ADV BACTERIAL VAGINOSIS(BV),TMA: NEGATIVE
TRICHOMONAS VAGINALIS (TV),TMA: NOT DETECTED

## 2024-02-27 LAB — HIV ANTIBODY (ROUTINE TESTING W REFLEX): HIV 1&2 Ab, 4th Generation: NONREACTIVE

## 2024-03-11 ENCOUNTER — Encounter: Payer: Self-pay | Admitting: Dermatology

## 2024-03-11 ENCOUNTER — Ambulatory Visit (INDEPENDENT_AMBULATORY_CARE_PROVIDER_SITE_OTHER): Payer: No Typology Code available for payment source | Admitting: Dermatology

## 2024-03-11 VITALS — BP 146/97

## 2024-03-11 DIAGNOSIS — L669 Cicatricial alopecia, unspecified: Secondary | ICD-10-CM

## 2024-03-11 DIAGNOSIS — Z79899 Other long term (current) drug therapy: Secondary | ICD-10-CM

## 2024-03-11 DIAGNOSIS — L6681 Central centrifugal cicatricial alopecia: Secondary | ICD-10-CM | POA: Diagnosis not present

## 2024-03-11 MED ORDER — TRIAMCINOLONE ACETONIDE 10 MG/ML IJ SUSP
10.0000 mg | Freq: Once | INTRAMUSCULAR | Status: AC
Start: 2024-03-11 — End: 2024-03-11
  Administered 2024-03-11: 10 mg

## 2024-03-11 MED ORDER — SAFETY SEAL MISCELLANEOUS MISC: 2 refills | Status: DC

## 2024-03-11 NOTE — Patient Instructions (Signed)

## 2024-03-11 NOTE — Progress Notes (Signed)
   Follow-Up Visit   Subjective  Donna Matthews is a 48 y.o. female who presents for the following: CCCA & Kenalog Injections  Patient present today for follow up visit. Patient was last evaluated on 11/11/23. At this visit patient was prescribed Medrock Minoxidil/Clobetasol gel - applying QAM. Recommended to use Viviscal and Vital Proteins. She is taking Viviscal daily and using Vital Proteins in her drinks when she remembers. Patient reports sxs are better. Patient denies medication changes.  The following portions of the chart were reviewed this encounter and updated as appropriate: medications, allergies, medical history  Review of Systems:  No other skin or systemic complaints except as noted in HPI or Assessment and Plan.  Objective  Well appearing patient in no apparent distress; mood and affect are within normal limits.   A focused examination was performed of the following areas: scalp   Relevant exam findings are noted in the Assessment and Plan.       Mid Parietal Scalp (20) Procedure Note Intralesional Injection  Location: crown of scalp  Informed Consent: Discussed risks (infection, pain, bleeding, bruising, thinning of the skin, loss of skin pigment, lack of resolution, and recurrence of lesion) and benefits of the procedure, as well as the alternatives. Informed consent was obtained. Preparation: The area was prepared a standard fashion.  Anesthesia: n/a  Procedure Details: An intralesional injection was performed with Kenalog 10 mg/cc. 1.0 cc in total were injected. NDC #: 9562-1308-65  Exp: 01/2025  Total number of injections: 20  Plan: The patient was instructed on post-op care. Recommend OTC analgesia as needed for pain.   Assessment & Plan   1. Central Centrifugal Cicatricial Alopecia (CCCA) - Assessment: Patient reports reduced inflammation and itching in the scalp, particularly at the crown area. Temples appear to be in good condition. Patient has  been compliant with prescribed treatments, including daily application of topical drops and regular intake of Vibascal and protein supplements. Patient reports feeling benefits after 4 months of treatment. Clinician notes that reduced pain during injections suggests improved control of inflammation.  - Plan:    Administer 1 CC of Kenalog injection in the affected area    Switch from cream formulation to oil formulation for easier application    Continue Vibascal and protein supplements    Continue Vesicare medication    Refill prescription to be sent to [facility] pharmacy, requesting oil formulation (cream gel as alternative)    Patient education:      - Advised to keep braids short if choosing box braids to avoid tension and inflammation     - Cautioned about potential chemical irritation from synthetic hair     - Recommended continued use of bonnet at night    Follow-up appointment scheduled in 4 months    CENTRAL CENTRIFUGAL CICATRICIAL ALOPECIA Mid Parietal Scalp (20) Related Medications triamcinolone acetonide (KENALOG) 10 MG/ML injection 10 mg  Safety Seal Miscellaneous MISC Medication name: AA Gel with Minoxidil USP 10% and Clobetasol USP 0.05% Apply to scalp every morning.  No follow-ups on file.    Documentation: I have reviewed the above documentation for accuracy and completeness, and I agree with the above.  I, Shirron Marcha Solders, CMA, am acting as scribe for [employer], DO.   Langston Reusing, DO

## 2024-05-25 ENCOUNTER — Encounter: Payer: Self-pay | Admitting: Nurse Practitioner

## 2024-05-25 ENCOUNTER — Ambulatory Visit (INDEPENDENT_AMBULATORY_CARE_PROVIDER_SITE_OTHER): Admitting: Nurse Practitioner

## 2024-05-25 VITALS — BP 110/70 | HR 75

## 2024-05-25 DIAGNOSIS — N898 Other specified noninflammatory disorders of vagina: Secondary | ICD-10-CM

## 2024-05-25 DIAGNOSIS — Z9889 Other specified postprocedural states: Secondary | ICD-10-CM | POA: Diagnosis not present

## 2024-05-25 DIAGNOSIS — R300 Dysuria: Secondary | ICD-10-CM

## 2024-05-25 DIAGNOSIS — N926 Irregular menstruation, unspecified: Secondary | ICD-10-CM | POA: Diagnosis not present

## 2024-05-25 DIAGNOSIS — Z113 Encounter for screening for infections with a predominantly sexual mode of transmission: Secondary | ICD-10-CM

## 2024-05-25 NOTE — Progress Notes (Signed)
   Acute Office Visit  Subjective:    Patient ID: Donna Matthews, female    DOB: 06-Feb-1976, 48 y.o.   MRN: [id number]   HPI 48 y.o. presents today for bleeding. H/O irregular menses. LMP 04/15/2024 that was normal and lasted about a week, then started spotting 5/22 for about 2 weeks. 01/2018 myomectomy. Last US  03/2018 showed small intramural fibroid 16 x 23 mm. Still having vaginal irritation. Has had neg urinalyses and wet preps. Denies discharge or odor. Saw urology, had small kidney stones. Would like STD screening today. 2017 left breast cancer managed with lumpectomy, radiation and antiestrogen therapy until 2023. BRCA negative.    Patient's last menstrual period was 05/16/2024. Period Duration (Days):  (varies) Period Pattern: (!) Irregular Menstrual Flow: Moderate, Heavy Menstrual Control: Maxi pad Dysmenorrhea: (!) Mild Dysmenorrhea Symptoms: Cramping, Nausea  Review of Systems  Constitutional: Negative.   Genitourinary:  Positive for dysuria, vaginal bleeding and vaginal pain (Irritation). Negative for difficulty urinating, dyspareunia, flank pain, frequency, genital sores, hematuria, pelvic pain, urgency and vaginal discharge.       Objective:    Physical Exam Constitutional:      Appearance: Normal appearance.  Genitourinary:    General: Normal vulva.     Vagina: Vaginal discharge present. No erythema.     Cervix: Normal.     Uterus: Normal.      Adnexa: Right adnexa normal and left adnexa normal.     BP 110/70   Pulse 75   LMP 05/16/2024   SpO2 99%  Wt Readings from Last 3 Encounters:  02/26/24 197 lb (89.4 kg)  02/26/23 189 lb (85.7 kg)  02/01/22 196 lb 8 oz (89.1 kg)        Arvell Birchwood, CMA present as Biomedical engineer.   UA negative  Assessment & Plan:   Problem List Items Addressed This Visit   None Visit Diagnoses       Irregular bleeding    -  Primary   Relevant Orders   US  PELVIC COMPLETE WITH TRANSVAGINAL     History of myomectomy       Relevant  Orders   US  PELVIC COMPLETE WITH TRANSVAGINAL     Screening examination for STD (sexually transmitted disease)       Relevant Orders   SureSwab Advanced Vaginitis Plus,TMA   RPR   HIV Antibody (routine testing w rflx)     Vaginal irritation       Relevant Orders   SureSwab Advanced Vaginitis Plus,TMA     Burning with urination       Relevant Orders   Urinalysis,Complete w/RFL Culture      Plan: STD/vaginitis panel pending. UA unremarkable. Discussed vaginal atrophy s/t perimenopause and history of antiestrogen treatment. Will try hyaluronic acid vaginal suppositories 2-3 times per week. She will reach out to oncology for approval to try vaginal estrogen. Schedule ultrasound.     Andee Bamberger DNP, 11:56 AM 05/25/2024

## 2024-05-26 LAB — SURESWAB® ADVANCED VAGINITIS PLUS,TMA
C. trachomatis RNA, TMA: NOT DETECTED
CANDIDA SPECIES: NOT DETECTED
Candida glabrata: NOT DETECTED
N. gonorrhoeae RNA, TMA: NOT DETECTED
SURESWAB(R) ADV BACTERIAL VAGINOSIS(BV),TMA: NEGATIVE
TRICHOMONAS VAGINALIS (TV),TMA: NOT DETECTED

## 2024-05-26 LAB — RPR: RPR Ser Ql: NONREACTIVE

## 2024-05-26 LAB — HIV ANTIBODY (ROUTINE TESTING W REFLEX): HIV 1&2 Ab, 4th Generation: NONREACTIVE

## 2024-05-27 ENCOUNTER — Ambulatory Visit: Payer: Self-pay | Admitting: Nurse Practitioner

## 2024-05-27 LAB — URINALYSIS, COMPLETE W/RFL CULTURE
Bilirubin Urine: NEGATIVE
Glucose, UA: NEGATIVE
Hyaline Cast: NONE SEEN /LPF
Leukocyte Esterase: NEGATIVE
Nitrites, Initial: NEGATIVE
Protein, ur: NEGATIVE
Specific Gravity, Urine: 1.02 (ref 1.001–1.035)
pH: 6 (ref 5.0–8.0)

## 2024-05-27 LAB — URINE CULTURE
MICRO NUMBER:: 16584472
SPECIMEN QUALITY:: ADEQUATE

## 2024-05-27 LAB — CULTURE INDICATED

## 2024-05-29 ENCOUNTER — Telehealth: Payer: Self-pay | Admitting: *Deleted

## 2024-05-29 NOTE — Telephone Encounter (Signed)
 Received call from pt stating she was seen by her gynecologist today who is requesting pt to start vaginal estrogen cream to help with vaginal dryness and atrophy. Per MD okay for pt to proceed with vaginal estrogen cream. Pt educated and verbalized understanding.

## 2024-05-29 NOTE — Telephone Encounter (Signed)
 Patient left message on triage line stating she f/u with Oncology regarding vaginal estrogen, was advised ok to proceed with a cream, no pills.   Routing to [employer]

## 2024-06-01 ENCOUNTER — Other Ambulatory Visit: Payer: Self-pay | Admitting: Radiology

## 2024-06-01 DIAGNOSIS — N952 Postmenopausal atrophic vaginitis: Secondary | ICD-10-CM

## 2024-06-01 MED ORDER — ESTRADIOL 0.1 MG/GM VA CREA: 1.0000 g | TOPICAL_CREAM | VAGINAL | 3 refills | Status: AC

## 2024-06-01 NOTE — Telephone Encounter (Signed)
 Patient notified. Encounter closed

## 2024-06-08 ENCOUNTER — Telehealth: Payer: Self-pay | Admitting: *Deleted

## 2024-06-08 NOTE — Telephone Encounter (Signed)
 Call returned to patient. Patient requesting Rx to stop bleeding.   OV on 05/25/24, bleeding since that week. Flow started heavy, to moderate to now spotting. Reports fatigue. Denies any other symptoms.   Scheduled for PUS 06/18/24, patient declines earlier appt for PUS.   Has not started vaginal estrogen.   Advised to keep appt for PUS, continue to monitor bleeding and symptoms. If bleeding become heavy, changing saturated pad/tampon q1-2 hours and/or any new symptoms develop, return call to office. Otherwise, keep PUS as scheduled. I will review with Tiffany and our office will return call if any additional recommendations.   Patient agreeable.   Routing for final review.

## 2024-06-08 NOTE — Telephone Encounter (Signed)
 Agree with continuing to monitor since bleeding has decreased to spotting.

## 2024-06-18 ENCOUNTER — Ambulatory Visit (INDEPENDENT_AMBULATORY_CARE_PROVIDER_SITE_OTHER): Admitting: Nurse Practitioner

## 2024-06-18 ENCOUNTER — Other Ambulatory Visit (INDEPENDENT_AMBULATORY_CARE_PROVIDER_SITE_OTHER)

## 2024-06-18 ENCOUNTER — Encounter: Payer: Self-pay | Admitting: Nurse Practitioner

## 2024-06-18 VITALS — BP 122/82 | HR 84

## 2024-06-18 DIAGNOSIS — N83299 Other ovarian cyst, unspecified side: Secondary | ICD-10-CM

## 2024-06-18 DIAGNOSIS — Z9889 Other specified postprocedural states: Secondary | ICD-10-CM

## 2024-06-18 DIAGNOSIS — N926 Irregular menstruation, unspecified: Secondary | ICD-10-CM | POA: Diagnosis not present

## 2024-06-18 DIAGNOSIS — D251 Intramural leiomyoma of uterus: Secondary | ICD-10-CM | POA: Insufficient documentation

## 2024-06-18 DIAGNOSIS — N9489 Other specified conditions associated with female genital organs and menstrual cycle: Secondary | ICD-10-CM

## 2024-06-18 DIAGNOSIS — N939 Abnormal uterine and vaginal bleeding, unspecified: Secondary | ICD-10-CM | POA: Diagnosis not present

## 2024-06-18 MED ORDER — NORETHINDRONE 0.35 MG PO TABS: 1.0000 | ORAL_TABLET | Freq: Every day | ORAL | 0 refills | Status: DC

## 2024-06-18 NOTE — Progress Notes (Signed)
   Acute Office Visit  Subjective:    Patient ID: Donna Matthews, female    DOB: 1976/06/04, 48 y.o.   MRN: [id number]   HPI 48 y.o. presents today for ultrasound. Seen 05/25/24 for bleeding. Menses in May started 04/15/24 that was normal and lasted about a week, then started spotting 5/22 for about 2 weeks. Had period 05/16/2024 and has had light bleeding since. Bleeding increase 06/16/24, which was likely her menses. 01/2018 myomectomy. Last US  03/2018 showed small intramural fibroid 16 x 23 mm.   Patient's last menstrual period was 05/16/2024. Period Pattern: (!) Irregular (bleeding since 05-16-24. got heavier on monday) Menstrual Control: Maxi pad Dysmenorrhea: (!) Mild Dysmenorrhea Symptoms: Cramping  Review of Systems  Constitutional: Negative.   Genitourinary:  Positive for menstrual problem.       Objective:    Physical Exam Constitutional:      Appearance: Normal appearance.   GU: Not indicated  BP 122/82   Pulse 84   LMP 05/16/2024   SpO2 99%  Wt Readings from Last 3 Encounters:  02/26/24 197 lb (89.4 kg)  02/26/23 189 lb (85.7 kg)  02/01/22 196 lb 8 oz (89.1 kg)        Assessment & Plan:   Problem List Items Addressed This Visit   None Visit Diagnoses       Complex ovarian cyst    -  Primary   Relevant Orders   US  PELVIS TRANSVAGINAL NON-OB (TV ONLY)     Endometrial mass         Intramural leiomyoma of uterus         Vaginal bleeding       Relevant Medications   norethindrone  (MICRONOR ) 0.35 MG tablet      Vaginal ultrasound (comparison is made to 2019 ultrasound in 2024 CT): Anteverted uterus, multiple intramural fibroids noted, largest measuring 15 mm.  Endometrium is fluid-filled with a filling defect - 15 x 10 mm with what appears to be a feeder vessel (polyp?).  Right ovary within normal limits.  Left ovary 19 x 11 mm complex cyst.  No adnexal masses, no free fluid.  Plan: Ultrasound reviewed. Multiple small fibroids present. Reassured this is likely not  cause for irregular bleeding. Endometrial polyp present. Will schedule surgical consult to discuss removal and is likely cause for persistent spotting. Recommend ultrasound in 6-8 weeks to follow up on complex cyst. Interested in POPs for bleeding control in the interim.       [name] DNP, 9:05 AM 06/18/2024

## 2024-07-02 NOTE — Progress Notes (Signed)
 48 y.o. [id number] female with endometrial polyps, hx of breast cancer (2018, completed tamoxifene therapy), known fibroids here for surgical consultation for diag hysteroscopy, D&C, polypectomy. Single.  Patient's last menstrual period was 05/16/2024 (approximate).   Started on POP since IMB started. No bleeding since. Not sexually active. No CP or SOB.  Last PAP:    Component Value Date/Time   DIAGPAP  01/09/2022 1438    - Negative for Intraepithelial Lesions or Malignancy (NILM)   DIAGPAP - Benign reactive/reparative changes 01/09/2022 1438   HPVHIGH Negative 01/09/2022 1438   ADEQPAP  01/09/2022 1438    Satisfactory for evaluation; transformation zone component PRESENT.   Birth control: OCP Sexually active: Not currently   06/18/24 TVUS: Anteverted uterus, multiple intramural fibroids noted, largest measuring 15 mm.  Endometrium is fluid-filled with a filling defect - 15 x 10 mm with what appears to be a feeder vessel (polyp?).     Right ovary within normal limits.  Left ovary 19 x 11 mm complex cyst.     No adnexal masses, no free fluid.  No recent EMB  GYN HISTORY: Prior hyst myomectomy, 2019- benign  OB History  Gravida Para Term Preterm AB Living  2    2 0  SAB IAB Ectopic Multiple Live Births  1  1      # Outcome Date GA Lbr Len/2nd Weight Sex Type Anes PTL Lv  2 Ectopic           1 SAB             Past Medical History:  Diagnosis Date   Alopecia    Anemia    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    Breast cancer (HCC) 10/2016   Ectopic pregnancy    Family history of melanoma    Family history of stomach cancer    Fibroids    GERD (gastroesophageal reflux disease)    Personal history of radiation therapy    PONV (postoperative nausea and vomiting)    STD (sexually transmitted disease)    Chlamydia    Past Surgical History:  Procedure Laterality Date   BREAST BIOPSY Right 08/20/2023   US  RT BREAST BX W LOC DEV 1ST LESION IMG BX SPEC US  GUIDE  08/20/2023 [facility]   BREAST LUMPECTOMY Left 2017   BREAST LUMPECTOMY WITH RADIOACTIVE SEED AND SENTINEL LYMPH NODE BIOPSY Left 12/18/2016   Procedure: RADIOACTIVE SEED GUIDED LEFT BREAST LUMPECTOMY WITH LEFT AXILLARY SENTINEL LYMPH NODE BIOPSY;  Surgeon: Debby Shipper, MD;  Location: [facility];  Service: General;  Laterality: Left;   DILATION AND CURETTAGE OF UTERUS     MYOMECTOMY N/A 01/14/2018   Procedure: ABDOMINAL MYOMECTOMY;  Surgeon: Rockney Evalene SQUIBB, MD;  Location: [facility];  Service: Gynecology;  Laterality: N/A;   POLYPECTOMY N/A 01/14/2018   Procedure: POLYPECTOMY;  Surgeon: Rockney Evalene SQUIBB, MD;  Location: [facility];  Service: Gynecology;  Laterality: N/A;    Current Outpatient Medications on File Prior to Visit  Medication Sig Dispense Refill   acetaminophen  (TYLENOL ) 500 MG tablet Take 500-1,000 mg by mouth every 6 (six) hours as needed (FOR PAIN).     Biotin  1 MG CAPS Take 1 mg by mouth daily.     Cholecalciferol  (VITAMIN D3) 5000 units CAPS Take 5,000 Units by mouth daily.     estradiol  (ESTRACE  VAGINAL) 0.1 MG/GM vaginal cream Place 1 g vaginally 3 (three) times a week. 42.5 g 3   Ferrous Sulfate  (IRON ) 325 (65 Fe) MG TABS  Take 1 tablet by mouth 3 (three) times daily. 30 each 0   Multiple Vitamins-Minerals (ADULT GUMMY PO) Take 2 tablets by mouth daily.     norethindrone  (MICRONOR ) 0.35 MG tablet Take 1 tablet (0.35 mg total) by mouth daily. 84 tablet 0   Safety Seal Miscellaneous MISC Medication name: AA Gel with Minoxidil USP 10% and Clobetasol USP 0.05% Apply to scalp every morning. 30 g 2   UNABLE TO FIND Med Name: protein vitamin     UNABLE TO FIND Med Name: vivical     No current facility-administered medications on file prior to visit.    Allergies  Allergen Reactions   Penicillins Swelling    Has patient had a PCN reaction causing immediate rash, facial/tongue/throat swelling, SOB or lightheadedness with hypotension:unknown Has patient had a PCN reaction  causing severe rash involving mucus membranes or skin necrosis: No Has patient had a PCN reaction that required hospitalization No Has patient had a PCN reaction occurring within the last 10 years: No If all of the above answers are NO, then may proceed with Cephalosporin use.    Bee Venom Hives and Swelling      PE Today's Vitals   07/03/24 0800  BP: (!) 140/72  Pulse: 80  Temp: 98.3 F (36.8 C)  TempSrc: Oral  SpO2: 99%  Weight: 196 lb (88.9 kg)   Body mass index is 34.18 kg/m.  Physical Exam Vitals reviewed.  Constitutional:      General: She is not in acute distress.    Appearance: Normal appearance.  HENT:     Head: Normocephalic and atraumatic.     Nose: Nose normal.  Eyes:     Extraocular Movements: Extraocular movements intact.     Conjunctiva/sclera: Conjunctivae normal.  Cardiovascular:     Rate and Rhythm: Normal rate and regular rhythm.     Heart sounds: No murmur heard.    No friction rub. No gallop.  Pulmonary:     Effort: Pulmonary effort is normal. No respiratory distress.     Breath sounds: Normal breath sounds. No stridor. No wheezing, rhonchi or rales.  Musculoskeletal:        General: Normal range of motion.     Cervical back: Normal range of motion.  Neurological:     General: No focal deficit present.     Mental Status: She is alert.  Psychiatric:        Mood and Affect: Mood normal.        Behavior: Behavior normal.      Assessment and Plan:        Endometrial polyp Assessment & Plan: Plan for hysteroscopy, dilation and curettage.  Discussed outpatient procedure. Reviewed that  recovery is usually 1-2 days. Risks including infections, bleeding, and damage to surrounding organs reviewed. Recommend NPO prior to midnight and reviewed medication to take on day of surgery. Dicussed use of NSAIDS as needed for pain postoperatively.  Preop checklist: Antibiotics: none DVT ppx: SCDs Postop visit: 1 week Additional clearance: none     Orders: -     Ambulatory Referral For Surgery Scheduling  Anemia, unspecified type -     CBC -     Ferritin  Abnormal uterine bleeding (AUB) -     Ambulatory Referral For Surgery Scheduling    Plan for surveillance of complex ovarian cyst with T. Prentiss.  Vera LULLA Pa, MD

## 2024-07-03 ENCOUNTER — Encounter: Payer: Self-pay | Admitting: Obstetrics and Gynecology

## 2024-07-03 ENCOUNTER — Ambulatory Visit (INDEPENDENT_AMBULATORY_CARE_PROVIDER_SITE_OTHER): Admitting: Obstetrics and Gynecology

## 2024-07-03 VITALS — BP 140/72 | HR 80 | Temp 98.3°F | Wt 196.0 lb

## 2024-07-03 DIAGNOSIS — N840 Polyp of corpus uteri: Secondary | ICD-10-CM | POA: Insufficient documentation

## 2024-07-03 DIAGNOSIS — N939 Abnormal uterine and vaginal bleeding, unspecified: Secondary | ICD-10-CM | POA: Diagnosis not present

## 2024-07-03 DIAGNOSIS — D649 Anemia, unspecified: Secondary | ICD-10-CM

## 2024-07-03 LAB — FERRITIN: Ferritin: 27 ng/mL (ref 16–232)

## 2024-07-03 LAB — CBC
HCT: 35.6 % (ref 35.0–45.0)
Hemoglobin: 12 g/dL (ref 11.7–15.5)
MCH: 26.6 pg — ABNORMAL LOW (ref 27.0–33.0)
MCHC: 33.7 g/dL (ref 32.0–36.0)
MCV: 78.9 fL — ABNORMAL LOW (ref 80.0–100.0)
MPV: 10.1 fL (ref 7.5–12.5)
Platelets: 216 Thousand/uL (ref 140–400)
RBC: 4.51 Million/uL (ref 3.80–5.10)
RDW: 16.4 % — ABNORMAL HIGH (ref 11.0–15.0)
WBC: 5.5 Thousand/uL (ref 3.8–10.8)

## 2024-07-03 NOTE — Assessment & Plan Note (Signed)
 Plan for hysteroscopy, dilation and curettage.  Discussed outpatient procedure. Reviewed that  recovery is usually 1-2 days. Risks including infections, bleeding, and damage to surrounding organs reviewed. Recommend NPO prior to midnight and reviewed medication to take on day of surgery. Dicussed use of NSAIDS as needed for pain postoperatively.  Preop checklist: Antibiotics: none DVT ppx: SCDs Postop visit: 1 week Additional clearance: none

## 2024-07-03 NOTE — Patient Instructions (Signed)
 Preoperative instructions: Nothing to eat or drink after midnight, unless instructed differently regarding clear liquids by the anesthesia team at [facility]. Do not take any medications on the day of surgery, except those listed below: NONE Please follow all other instructions as provided by our surgical scheduler at [facility] and the anesthesia team at [facility].  Postoperative instructions: Take ibuprofen as prescribed and over-the-counter Tylenol as needed.

## 2024-07-10 ENCOUNTER — Ambulatory Visit: Payer: Self-pay | Admitting: Obstetrics and Gynecology

## 2024-07-10 ENCOUNTER — Telehealth: Payer: Self-pay | Admitting: *Deleted

## 2024-07-10 NOTE — Telephone Encounter (Signed)
 Call returned to patient.   Surgery dates reviewed, patient would like benefits first, see surgery referral.   Patient is requesting 07/03/24 labs, advised will forward to Dr. Dallie, our office will f/u once reviewed. Patient agreeable.

## 2024-07-20 ENCOUNTER — Encounter: Payer: Self-pay | Admitting: Dermatology

## 2024-07-20 ENCOUNTER — Ambulatory Visit: Admitting: Dermatology

## 2024-07-20 VITALS — BP 124/78

## 2024-07-20 DIAGNOSIS — L209 Atopic dermatitis, unspecified: Secondary | ICD-10-CM

## 2024-07-20 DIAGNOSIS — L649 Androgenic alopecia, unspecified: Secondary | ICD-10-CM

## 2024-07-20 DIAGNOSIS — L309 Dermatitis, unspecified: Secondary | ICD-10-CM

## 2024-07-20 DIAGNOSIS — L6681 Central centrifugal cicatricial alopecia: Secondary | ICD-10-CM

## 2024-07-20 DIAGNOSIS — L669 Cicatricial alopecia, unspecified: Secondary | ICD-10-CM

## 2024-07-20 MED ORDER — TRIAMCINOLONE ACETONIDE 0.1 % EX OINT: TOPICAL_OINTMENT | CUTANEOUS | 2 refills | Status: AC

## 2024-07-20 MED ORDER — SAFETY SEAL MISCELLANEOUS MISC: 2 refills | Status: DC

## 2024-07-20 MED ORDER — TRIAMCINOLONE ACETONIDE 10 MG/ML IJ SUSP
10.0000 mg | Freq: Once | INTRAMUSCULAR | Status: AC
  Administered 2024-07-20 (×2): 10 mg

## 2024-07-20 NOTE — Patient Instructions (Addendum)
 Date: Mon Jul 20 2024  Hello Donna Matthews,  Thank you for visiting today. Here is a summary of the key instructions:  - Medications:   - Use triamcinolone  cream for eczema for 2 weeks, then stop   - Use for 4-5 days or until better   - Can use on neck and chest too   - Continue using prescribed hair loss Compound Topical treatment   - Apply to crown, vertex, and temples in the morning  - Skin Care:   - Use fragrance-free moisturizer and body wash like Dove Sensitive   - Apply moisturizer right after showering, especially in cool weather   - Avoid spraying perfume on skin, spray on clothes instead  - Treatment Areas:   - Received catalog 10 injections 0.6cc total during visit  - Follow-up:   - Next appointment in 4 months in December  - Other Instructions:   - Continue current hair care routine   - Braids are okay if not too tight   - For added hair, ensure it's not too long or heavy   - Continue taking Viviscal and protein powder daily   - Message through MyChart for any questions  Please reach out if you have any questions or concerns.  Warm regards,  Dr. [facility]    Important Information  Due to recent changes in healthcare laws, you may see results of your pathology and/or laboratory studies on MyChart before the doctors have had a chance to review them. We understand that in some cases there may be results that are confusing or concerning to you. Please understand that not all results are received at the same time and often the doctors may need to interpret multiple results in order to provide you with the best plan of care or course of treatment. Therefore, we ask that you please give us  2 business days to thoroughly review all your results before contacting the office for clarification. Should we see a critical lab result, you will be contacted sooner.   If You Need Anything After Your Visit  If you have any questions or concerns for your doctor,  please call our main line at [phone] If no one answers, please leave a voicemail as directed and we will return your call as soon as possible. Messages left after 4 pm will be answered the following business day.   You may also send us  a message via MyChart. We typically respond to MyChart messages within 1-2 business days.  For prescription refills, please ask your pharmacy to contact our office. Our fax number is [phone].  If you have an urgent issue when the clinic is closed that cannot wait until the next business day, you can page your doctor at the number below.    Please note that while we do our best to be available for urgent issues outside of office hours, we are not available 24/7.   If you have an urgent issue and are unable to reach us , you may choose to seek medical care at your doctor's office, retail clinic, urgent care center, or emergency room.  If you have a medical emergency, please immediately call 911 or go to the emergency department. In the event of inclement weather, please call our main line at [phone] for an update on the status of any delays or closures.  Dermatology Medication Tips: Please keep the boxes that topical medications come in in order to help keep track of the instructions about where and how to  use these. Pharmacies typically print the medication instructions only on the boxes and not directly on the medication tubes.   If your medication is too expensive, please contact our office at [phone] or send us  a message through MyChart.   We are unable to tell what your co-pay for medications will be in advance as this is different depending on your insurance coverage. However, we may be able to find a substitute medication at lower cost or fill out paperwork to get insurance to cover a needed medication.   If a prior authorization is required to get your medication covered by your insurance company, please allow us  1-2 business days to  complete this process.  Drug prices often vary depending on where the prescription is filled and some pharmacies may offer cheaper prices.  The website www.goodrx.com contains coupons for medications through different pharmacies. The prices here do not account for what the cost may be with help from insurance (it may be cheaper with your insurance), but the website can give you the price if you did not use any insurance.  - You can print the associated coupon and take it with your prescription to the pharmacy.  - You may also stop by our office during regular business hours and pick up a GoodRx coupon card.  - If you need your prescription sent electronically to a different pharmacy, notify our office through  [facility] or by phone at [phone]

## 2024-07-20 NOTE — Progress Notes (Signed)
 Follow-Up Visit   Subjective  Donna Matthews is a 48 y.o. female established patient who presents for FOLLOW UP on the diagnoses listed below:  Patient was last evaluated on 03/11/24.   CCCA: Prescribed Medrock minoxidil & clobetasol gel to apply QAM and administered kenalog  injections. Recommended Viviscal & Vital Protein. Patient reports sxs are unchanged however he stylist has noticed a difference in improvement.    The following portions of the chart were reviewed this encounter and updated as appropriate: medications, allergies, medical history  Review of Systems:  No other skin or systemic complaints except as noted in HPI or Assessment and Plan.  Objective  Well appearing patient in no apparent distress; mood and affect are within normal limits.   A focused examination was performed of the following areas: scalp   Relevant exam findings are noted in the Assessment and Plan.              Mid Parietal Scalp (10) Procedure Note Intralesional Injection  Location: crown of scalp  Informed Consent: Discussed risks (infection, pain, bleeding, bruising, thinning of the skin, loss of skin pigment, lack of resolution, and recurrence of lesion) and benefits of the procedure, as well as the alternatives. Informed consent was obtained. Preparation: The area was prepared a standard fashion.  Anesthesia: n/a  Procedure Details: An intralesional injection was performed with Kenalog  10 mg/cc. 0.6 cc in total were injected. NDC #: 9996-9505-79  Exp: 01/2025  Total number of injections: 20  Plan: The patient was instructed on post-op care. Recommend OTC analgesia as needed for pain.   Assessment & Plan   1. Eczema - Assessment: Patient reports a history of eczema, currently presenting with mild symptoms on the arm, neck, and chest. The condition appears stable but requires management to prevent exacerbation. 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 Current examination shows stable scarring without progression, which is positive as the condition is typically progressive if untreated. Some regrowth is noted, particularly in areas affected by androgenetic alopecia. The frontal scalp and temples show improvement. The patient is using topical treatments and supplements, which appear to be contributing to the observed regrowth.  - Plan:    Continue current topical treatment regimen to crown, vertex, and temples every morning    Add finasteride to current regimen for hormonal hair thinning    Continue supplements: Viviscal and protein powder daily    Administer kenalog  injections: total dose 0.6cc to 10 injection sites focusing on the top of the scalp    Educate on proper hair care: avoid tight hairstyles, knotless braids are ideal if adding hair    Ensure braids are not too long or heavy    Continue treatment regimen daily for at least one year before considering alternate day dosing  Follow-up in 4 months to assess response to treatment modifications. ATOPIC DERMATITIS, UNSPECIFIED TYPE   Related Medications triamcinolone  ointment (KENALOG ) 0.1 % apply BID for 2 weeks on  2 weeks off. Repeat PRN for flares. 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  Intralesional injection  - Mid Parietal Scalp (10) Related Medications triamcinolone  acetonide (KENALOG ) 10 MG/ML injection 10 mg   Return in about 4 months (around 11/19/2024) for CCCA, ATOPIC DERM/ECZEMA.   Documentation: I have reviewed the above documentation for accuracy and completeness, and I agree with the above.  I, Shirron Maranda, CMA, am acting as scribe for [employer], DO.   Delon Lenis, DO

## 2024-07-28 ENCOUNTER — Ambulatory Visit (INDEPENDENT_AMBULATORY_CARE_PROVIDER_SITE_OTHER): Admitting: Neurology

## 2024-07-28 ENCOUNTER — Other Ambulatory Visit: Payer: Self-pay | Admitting: Hematology and Oncology

## 2024-07-28 ENCOUNTER — Encounter: Payer: Self-pay | Admitting: Neurology

## 2024-07-28 VITALS — BP 145/85 | HR 69 | Ht 64.0 in | Wt 198.0 lb

## 2024-07-28 DIAGNOSIS — G478 Other sleep disorders: Secondary | ICD-10-CM | POA: Diagnosis not present

## 2024-07-28 DIAGNOSIS — Z1231 Encounter for screening mammogram for malignant neoplasm of breast: Secondary | ICD-10-CM

## 2024-07-28 DIAGNOSIS — R519 Headache, unspecified: Secondary | ICD-10-CM | POA: Diagnosis not present

## 2024-07-28 DIAGNOSIS — Z853 Personal history of malignant neoplasm of breast: Secondary | ICD-10-CM | POA: Diagnosis not present

## 2024-07-28 NOTE — Progress Notes (Signed)
 [facility]    Provider:  Dedra Gores, MD  Primary Care Physician:  Alben Therisa MATSU, [name] [address] [place] [address]     Referring Provider: Cristopher Bottcher, Np [address] [place],  [place] [address]          Chief Complaint according to patient   Patient presents with:     New Patient (Initial Visit)           HISTORY OF PRESENT ILLNESS:  Donna Matthews is a 48 y.o. female patient who is seen upon  PCP referral on 07/28/2024  for an evaluation of  headaches and  reports she stopped a medication in April that caused dizziness.  Dizziness is not longer a problem but  sleepiness is.  Menopausal symptoms  on tamoxifen , has still hot flashes, nightly woken by sweat, feeling hot, but no palpitations.    The headaches can be frontal , can be sharp, but most headaches are dull, in am upon waking up- and don't last long . Headaches feel not predictable, and can occur at any time of the day.  No nausea with these headaches.  No vision changes, no aura,.  These headaches are a new onset.  Headaches occur 5 /months, and they last only moments 5 minutes or less, they,came and go.  No residual  symptoms.  Her sleep is not as restorative and she sleeps barely 7 hours.  Sleep relevant medical history: Nocturia 1-3 times , no Tonsillectomy,  mastectomy/ lumpectomy in 1/ 2018. Family medical /sleep history: no  other family member on CPAP with OSA, mother has HTN related headaches, niece with migraines.     Social history:  Patient is working as a Museum/gallery conservator.  and lives in a household  with her  mother. Family status is single,  no pets, no kids.  Tobacco use; none .  ETOH use ;none , Caffeine intake in form of Coffee( /) Soda( 1-2 on Weekends ) Tea ( /) or energy drinks Exercise: none .     Sleep habits are as follows:  The patient's dinner time is between 7-8 PM. The patient goes to bed at 10-10.30  PM and continues to sleep for  intervals 2-3 hours, wakes for 1-3 bathroom breaks,  she keeps the TV on all night.  Bedroom is not quiet or dark.  Timer set for 3 hours  The preferred sleep position is laterally , prone , with the support of 2 pillows. Flat bed- Dreams are reportedly rare/ The patient wakes up  with an alarm. 6  AM is the usual rise time. She reports not feeling refreshed or restored in AM, sometimes with symptoms such as dry mouth, morning headaches, and residual fatigue.  Naps are taken infrequently,she reports she is unable to sleep- due lack of opportunity.   lasting from 20 to 30 minutes and are more refreshing.  Not known to snore.     Review of Systems: Out of a complete 14 system review, the patient complains of only the following symptoms, and all other reviewed systems are negative.:  Fatigue, sleepiness , snoring, fragmented sleep, Nocturia   Morning headaches at 5/ months    How likely are you to doze in the following situations: 0 = not likely, 1 = slight chance, 2 = moderate chance, 3 = high chance   Sitting and Reading? Watching Television? Sitting inactive in a public place (theater or meeting)?  As a passenger in a car for an hour without a break? Lying down in the afternoon when circumstances permit? Sitting and talking to someone? Sitting quietly after lunch without alcohol? In a car, while stopped for a few minutes in traffic?   Total = 10/ 24 points   FSS endorsed at 18/ 63 points.     Social History   Socioeconomic History   Marital status: Single    Spouse name: Not on file   Number of children: Not on file   Years of education: Not on file   Highest education level: Not on file  Occupational History   Occupation: Customer service  Tobacco Use   Smoking status: Never   Smokeless tobacco: Never  Vaping Use   Vaping status: Never Used  Substance and Sexual Activity   Alcohol use: No    Alcohol/week: 0.0 standard drinks of alcohol   Drug use: No   Sexual  activity: Not Currently    Partners: Male    Birth control/protection: Abstinence    Comment: DECLINED INSURANCE QUESTIONS, Hx of TV+  Other Topics Concern   Not on file  Social History Narrative   Not on file   Social Drivers of Health   Financial Resource Strain: Not on file  Food Insecurity: Not on file  Transportation Needs: Not on file  Physical Activity: Not on file  Stress: Not on file  Social Connections: Not on file    Family History  Problem Relation Age of Onset   Diabetes Mother    Hypertension Mother    Heart disease Father    Heart attack Father    Diabetes Sister    Hypertension Sister    Lung cancer Maternal Aunt    Melanoma Maternal Uncle    Lung cancer Maternal Uncle    Stomach cancer Cousin        father's maternal cousin   Cancer Nephew        Kidney cancer   BRCA 1/2 Nephew        tested negative   Breast cancer Niece 25   BRCA 1/2 Niece        tested negative   Stomach cancer Other        paternal great aunt   Cancer Other        2 maternal great aunts with unknown cancer    Past Medical History:  Diagnosis Date   Alopecia    Anemia    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    Breast cancer (HCC) 10/2016   Ectopic pregnancy    Family history of melanoma    Family history of stomach cancer    Fibroids    GERD (gastroesophageal reflux disease)    Personal history of radiation therapy    PONV (postoperative nausea and vomiting)    STD (sexually transmitted disease)    Chlamydia    Past Surgical History:  Procedure Laterality Date   BREAST BIOPSY Right 08/20/2023   US  RT BREAST BX W LOC DEV 1ST LESION IMG BX SPEC US  GUIDE 08/20/2023 [facility]   BREAST LUMPECTOMY Left 2017   BREAST LUMPECTOMY WITH RADIOACTIVE SEED AND SENTINEL LYMPH NODE BIOPSY Left 12/18/2016   Procedure: RADIOACTIVE SEED GUIDED LEFT BREAST LUMPECTOMY WITH LEFT AXILLARY SENTINEL LYMPH NODE BIOPSY;  Surgeon: Debby Shipper, MD;  Location: [facility];  Service:  General;  Laterality: Left;   DILATION AND CURETTAGE OF UTERUS     MYOMECTOMY N/A 01/14/2018   Procedure: ABDOMINAL MYOMECTOMY;  Surgeon: Rockney Evalene SQUIBB, MD;  Location: [facility];  Service: Gynecology;  Laterality: N/A;   POLYPECTOMY N/A 01/14/2018   Procedure: POLYPECTOMY;  Surgeon: Rockney Evalene SQUIBB, MD;  Location: [facility];  Service: Gynecology;  Laterality: N/A;     Current Outpatient Medications on File Prior to Visit  Medication Sig Dispense Refill   acetaminophen  (TYLENOL ) 500 MG tablet Take 500-1,000 mg by mouth every 6 (six) hours as needed (FOR PAIN).     Biotin  1 MG CAPS Take 1 mg by mouth daily.     Cholecalciferol  (VITAMIN D3) 5000 units CAPS Take 5,000 Units by mouth daily.     estradiol  (ESTRACE  VAGINAL) 0.1 MG/GM vaginal cream Place 1 g vaginally 3 (three) times a week. 42.5 g 3   Ferrous Sulfate  (IRON ) 325 (65 Fe) MG TABS Take 1 tablet by mouth 3 (three) times daily. 30 each 0   Multiple Vitamins-Minerals (ADULT GUMMY PO) Take 2 tablets by mouth daily.     norethindrone  (MICRONOR ) 0.35 MG tablet Take 1 tablet (0.35 mg total) by mouth daily. 84 tablet 0   omeprazole (PRILOSEC) 40 MG capsule Take 40 mg by mouth daily.     Safety Seal Miscellaneous MISC Medication name: Gel Minoxidil USP 10% and Clobetasol USP 0.05% Finasteride 0.1% - Apply to scalp every morning. 30 g 2   Specialty Vitamins Products (COLLAGEN ULTRA PO) Take by mouth.     triamcinolone  ointment (KENALOG ) 0.1 % apply BID for 2 weeks on 2 weeks off. Repeat PRN for flares. 80 g 2   No current facility-administered medications on file prior to visit.    Allergies  Allergen Reactions   Penicillins Swelling    Has patient had a PCN reaction causing immediate rash, facial/tongue/throat swelling, SOB or lightheadedness with hypotension:unknown Has patient had a PCN reaction causing severe rash involving mucus membranes or skin necrosis: No Has patient had a PCN reaction that required hospitalization No Has patient  had a PCN reaction occurring within the last 10 years: No If all of the above answers are NO, then may proceed with Cephalosporin use.    Bee Venom Hives and Swelling     DIAGNOSTIC DATA (LABS, IMAGING, TESTING) - I reviewed patient records, labs, notes, testing and imaging myself where available.  Lab Results  Component Value Date   WBC 5.5 07/03/2024   HGB 12.0 07/03/2024   HCT 35.6 07/03/2024   MCV 78.9 (L) 07/03/2024   PLT 216 07/03/2024      Component Value Date/Time   NA 141 11/26/2017 1535   NA 141 11/14/2016 1221   K 4.1 11/26/2017 1535   K 3.9 11/14/2016 1221   CL 109 11/26/2017 1535   CO2 25 11/26/2017 1535   CO2 25 11/14/2016 1221   GLUCOSE 93 11/26/2017 1535   GLUCOSE 100 11/14/2016 1221   BUN 6 (L) 11/26/2017 1535   BUN 8.3 11/14/2016 1221   CREATININE 0.59 11/26/2017 1535   CREATININE 0.7 11/14/2016 1221   CALCIUM 8.7 11/26/2017 1535   CALCIUM 8.9 11/14/2016 1221   PROT 5.9 (L) 11/26/2017 1535   PROT 6.7 11/14/2016 1221   ALBUMIN 3.4 (L) 11/21/2017 1646   ALBUMIN 3.5 11/14/2016 1221   AST 13 11/26/2017 1535   AST 11 11/14/2016 1221   ALT 12 11/26/2017 1535   ALT 10 11/14/2016 1221   ALKPHOS 37 (L) 11/21/2017 1646   ALKPHOS 64 11/14/2016 1221   BILITOT 0.3 11/26/2017 1535   BILITOT 0.42 11/14/2016 1221   GFRNONAA >60  11/22/2017 0656   GFRAA >60 11/22/2017 0656   Lab Results  Component Value Date   CHOL 141 10/12/2016   HDL 45 (L) 10/12/2016   LDLCALC 84 10/12/2016   TRIG 60 10/12/2016   CHOLHDL 3.1 10/12/2016   Lab Results  Component Value Date   HGBA1C 4.9 09/28/2015   No results found for: VITAMINB12 Lab Results  Component Value Date   TSH 3.29 06/03/2018    PHYSICAL EXAM:  Today's Vitals   07/28/24 1404  BP: (!) 145/85  Pulse: 69  Weight: 198 lb (89.8 kg)  Height: 5' 4 (1.626 m)   Body mass index is 33.99 kg/m.   Wt Readings from Last 3 Encounters:  07/28/24 198 lb (89.8 kg)  07/03/24 196 lb (88.9 kg)  02/26/24 197  lb (89.4 kg)     Ht Readings from Last 3 Encounters:  07/28/24 5' 4 (1.626 m)  02/26/24 5' 3.5 (1.613 m)  02/26/23 5' 4.5 (1.638 m)      General: The patient is awake, alert and appears not in acute distress. The patient is well groomed. Head: Normocephalic, atraumatic. Neck is supple.  Mallampati 3,  neck circumference:16 inches . Nasal airflow  patent.  Retrognathia is not seen.  Dental status: biological.  Cardiovascular:  Regular rate and cardiac rhythm by pulse,  without distended neck veins. Respiratory: Lungs are clear to auscultation.  Skin:  Without evidence of ankle edema, or rash. Trunk: The patient's posture is erect.   NEUROLOGIC EXAM: The patient is awake and alert, oriented to place and time.   Memory subjective described as intact.  Attention span & concentration ability appears normal.  Speech is fluent,  without  dysarthria, dysphonia or aphasia.  Mood and affect are appropriate.   Cranial nerves: no loss of smell or taste reported  Pupils are equal and briskly reactive to light. Funduscopic exam deferred.  Extraocular movements in vertical and horizontal planes were intact and without nystagmus. No Diplopia. Visual fields by finger perimetry are intact. Hearing was intact to soft voice and finger rubbing.    Facial sensation intact to fine touch.  Facial motor strength is symmetric and tongue midline.  Neck ROM : rotation, tilt and flexion extension were normal for age and shoulder shrug was symmetrical.    Motor exam:  Symmetric bulk, tone and ROM.   Normal tone without cog -wheeling, symmetric grip strength .   Sensory:  vibration was normal.  Proprioception tested in the upper extremities was normal.   Coordination: Rapid alternating movements in the fingers/hands were of normal speed.  The Finger-to-nose maneuver was intact without evidence of ataxia, dysmetria or tremor.   Gait and station: Patient could rise unassisted from a seated position,  walked without assistive device.   Toe and heel walk were deferred.  Deep tendon reflexes: in the  upper and lower extremities are symmetric and intact.      ASSESSMENT AND PLAN 48 y.o. year old female  here with:  New onset headaches at age 24 in a breast cancer survivor.   Non refreshing sleep, morning headaches, non migrainous.    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   Menopausal symptoms interfered with sleep quality     1) MRI brain, with and without contrast.   2) HST for screening of sleep hypoxia.   3) Anemia. GERD,  had myomectomy 2019.  Has still periods. Check ferritin.   4) preventive  HA meds are not indicated because of low  frequency of the headaches. Rec as needed OTC tylenol , but would be careful with NSAIDS.    I plan to follow up either personally or through our NP within 5 months.   I would like to thank Alben Therisa MATSU, PA and Cristopher Bottcher, Np [address] [place],  [place] [address] for allowing me to meet with and to take care of this pleasant patient.   Discussion of sleep hygiene setting bedtime and rise time,  hot shower  before bed time, no screen light in the bedroom, the bedroom should be cool, quiet and dark. Night lights should illuminate the floor not shine into your eyes. Golden glow  light is less intrusive than blue or cold light.  Read in a book with pages, not on a device. Consider audio books and soothing  sound -scapes.    After spending a total time of  45  minutes face to face and additional time for physical and neurologic examination, review of laboratory studies,  personal review of imaging studies, reports and results of other testing and review of referral information / records as far as provided in visit,   Electronically signed by: Dedra Gores, MD 07/28/2024 2:28 PM  [facility] Board certified by The [employer] of Sleep Medicine and Diplomate  of the [employer] of Sleep Medicine. Board certified In Neurology through the ABPN, Fellow of the [employer] of Neu[employer].

## 2024-07-28 NOTE — Patient Instructions (Addendum)
 ASSESSMENT AND PLAN 48 y.o. year old female  here with:  New onset headaches at age 29 in a breast cancer survivor.   Non refreshing sleep, morning headaches, non migrainous.    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   Menopausal symptoms interfered with sleep quality     1) MRI brain, with and without contrast.   2) HST for screening of sleep hypoxia.   3) Anemia. GERD,  had myomectomy 2019.  Has still periods. Check ferritin.   4) preventive  HA meds are not indicated because of low frequency of the headaches. Rec as needed OTC tylenol , but would be careful with NSAIDS.    I plan to follow up either personally or through our NP within 5 months.   Sinus Pain  Sinus pain happens when your sinuses get swollen or blocked (clogged). Sinuses are spaces behind the bones of your face and forehead. You may feel pain or pressure in your face, forehead, ears, or upper teeth. Sinus pain can be mild or very bad. What are the causes? Sinus pain can result from conditions that affect your sinuses. Common causes include: Colds. Sinus infections. Allergies. What are the signs or symptoms? The main symptom of this condition is pain or pressure in your face, forehead, ears, or upper teeth. People who have sinus pain often have other symptoms, such as: Stuffed up or runny nose. Fever. Not being able to smell. Headache. Weather changes can make your symptoms worse. How is this treated? Treatment for this condition depends on the cause. Sinus pain caused by: A sinus infection may be treated with antibiotic medicine. A stuffy nose may be helped by rinsing out the nose and sinuses with a salt water solution. Allergies may be helped by allergy medicines and nasal sprays. Surgery may be needed in some cases if other treatments do not help. Follow these instructions at home: General instructions If told: Apply a warm, moist washcloth to your face. This can help to  lessen pain. Use a nasal salt water wash. Follow the directions on the bottle or box. Hydrate and humidify Drink enough water to keep your pee (urine) pale yellow. Use a humidifier if your home is dry. Breathe in steam for 10-15 minutes, 3-4 times a day or as told by your doctor. You can do this in the bathroom while a hot shower is running. Try not to spend time in cool or dry air. Medicines  Take over-the-counter and prescription medicines only as told by your doctor. If you were prescribed an antibiotic medicine, take it as told by your doctor. Do not stop taking it even if you start to feel better. Use a nose spray if your nose feels full of mucus (congested). Contact a doctor if: You get more than one headache a week. Light or sound bothers you. You have a fever. You feel sick to your stomach (nauseous) or you vomit. Your headaches do not get better with treatment. Get help right away if: You have trouble seeing. You suddenly have very bad pain in your face or head. You start to have quick, sudden movements or shaking that you cannot control (seizure). You are confused. You have a stiff neck. Summary Sinus pain happens when your sinuses get swollen or blocked (clogged). Sinuses are spaces behind the bones of your face and forehead. You may feel pain or pressure in your face, forehead, ears, or upper teeth. Take over-the-counter and prescription medicines only as told by your doctor.  If told, apply a warm, moist washcloth to your face. This can help to lessen pain. This information is not intended to replace advice given to you by your health care provider. Make sure you discuss any questions you have with your health care provider. Document Revised: 10/29/2021 Document Reviewed: 10/29/2021 Elsevier Patient Education  2024 Elsevier Inc.Quality Sleep Information, Adult Quality sleep is important for your mental and physical health. It also improves your quality of life. Quality  sleep means you: Are asleep for most of the time you are in bed. Fall asleep within 30 minutes. Wake up no more than once a night. Are awake for no longer than 20 minutes if you do wake up during the night. Most adults need 7-8 hours of quality sleep each night. How can poor sleep affect me? If you do not get enough quality sleep, you may have: Mood swings. Daytime sleepiness. Decreased alertness, reaction time, and concentration. Sleep disorders, such as insomnia and sleep apnea. Difficulty with: Solving problems. Coping with stress. Paying attention. These issues may affect your performance and productivity at work, school, and home. Lack of sleep may also put you at higher risk for accidents, suicide, and risky behaviors. If you do not get quality sleep, you may also be at higher risk for several health problems, including: Infections. Type 2 diabetes. Heart disease. High blood pressure. Obesity. Worsening of long-term conditions, like arthritis, kidney disease, depression, Parkinson's disease, and epilepsy. What actions can I take to get more quality sleep? Sleep schedule and routine Stick to a sleep schedule. Go to sleep and wake up at about the same time each day. Do not try to sleep less on weekdays and make up for lost sleep on weekends. This does not work. Limit naps during the day to 30 minutes or less. Do not take naps in the late afternoon. Make time to relax before bed. Reading, listening to music, or taking a hot bath promotes quality sleep. Make your bedroom a place that promotes quality sleep. Keep your bedroom dark, quiet, and at a comfortable room temperature. Make sure your bed is comfortable. Avoid using electronic devices that give off bright blue light for 30 minutes before bedtime. Your brain perceives bright blue light as sunlight. This includes television, phones, and computers. If you are lying awake in bed for longer than 20 minutes, get up and do a relaxing  activity until you feel sleepy. Lifestyle     Try to get at least 30 minutes of exercise on most days. Do not exercise 2-3 hours before going to bed. Do not use any products that contain nicotine or tobacco. These products include cigarettes, chewing tobacco, and vaping devices, such as e-cigarettes. If you need help quitting, ask your health care provider. Do not drink caffeinated beverages for at least 8 hours before going to bed. Coffee, tea, and some sodas contain caffeine. Do not drink alcohol or eat large meals close to bedtime. Try to get at least 30 minutes of sunlight every day. Morning sunlight is best. Medical concerns Work with your health care provider to treat medical conditions that may affect sleeping, such as: Nasal obstruction. Snoring. Sleep apnea and other sleep disorders. Talk to your health care provider if you think any of your prescription medicines may cause you to have difficulty falling or staying asleep. If you have sleep problems, talk with a sleep consultant. If you think you have a sleep disorder, talk with your health care provider about getting evaluated by a  specialist. Where to find more information Sleep Foundation: sleepfoundation.org American Academy of Sleep Medicine: aasm.org Centers for Disease Control and Prevention (CDC): TonerPromos.no Contact a health care provider if: You have trouble getting to sleep or staying asleep. You often wake up very early in the morning and cannot get back to sleep. You have daytime sleepiness. You have daytime sleep attacks of suddenly falling asleep and sudden muscle weakness (narcolepsy). You have a tingling sensation in your legs with a strong urge to move your legs (restless legs syndrome). You stop breathing briefly during sleep (sleep apnea). You think you have a sleep disorder or are taking a medicine that is affecting your quality of sleep. Summary Most adults need 7-8 hours of quality sleep each night. Getting  enough quality sleep is important for your mental and physical health. Make your bedroom a place that promotes quality sleep, and avoid things that may cause you to have poor sleep, such as alcohol, caffeine, smoking, or large meals. Talk to your health care provider if you have trouble falling asleep or staying asleep. This information is not intended to replace advice given to you by your health care provider. Make sure you discuss any questions you have with your health care provider. Document Revised: 03/21/2022 Document Reviewed: 03/21/2022 Elsevier Patient Education  2024 [employer].

## 2024-07-29 ENCOUNTER — Encounter (HOSPITAL_COMMUNITY): Payer: Self-pay | Admitting: Obstetrics and Gynecology

## 2024-07-29 NOTE — Progress Notes (Addendum)
 Spoke w/ via phone for pre-op interview--- Donna Matthews needs dos----UPT per anesthesia. Surgeon orders requested 07/29/24.        Matthews results------ COVID test -----patient states asymptomatic no test needed Arrive at -------1130 NPO after MN NO Solid Food.  Clear liquids from MN until---1030 Pre-Surgery Ensure or G2:  Med rec completed Medications to take morning of surgery -----NONE Diabetic medication -----  GLP1 agonist last dose: GLP1 instructions:  Patient instructed no nail polish to be worn day of surgery Patient instructed to bring photo id and insurance card day of surgery Patient aware to have Driver (ride ) / caregiver    for 24 hours after surgery - Niece Donna Matthews Patient Special Instructions ----- Pre-Op special Instructions -----  Patient verbalized understanding of instructions that were given at this phone interview. Patient denies chest pain, sob, fever, cough at the interview.

## 2024-07-29 NOTE — Telephone Encounter (Signed)
 See surgery referral.   Encounter closed.

## 2024-08-04 ENCOUNTER — Other Ambulatory Visit: Payer: Self-pay

## 2024-08-04 ENCOUNTER — Encounter (HOSPITAL_COMMUNITY)
Admission: RE | Disposition: A | Discharge status: Home / Self Care | Payer: Self-pay | Source: Home / Self Care | Attending: Obstetrics and Gynecology

## 2024-08-04 ENCOUNTER — Other Ambulatory Visit: Admitting: Nurse Practitioner

## 2024-08-04 ENCOUNTER — Ambulatory Visit (HOSPITAL_COMMUNITY)

## 2024-08-04 ENCOUNTER — Ambulatory Visit (HOSPITAL_COMMUNITY)
Admission: RE | Admit: 2024-08-04 | Discharge: 2024-08-04 | Disposition: A | Discharge status: Home / Self Care | Attending: Obstetrics and Gynecology | Admitting: Obstetrics and Gynecology

## 2024-08-04 ENCOUNTER — Encounter (HOSPITAL_COMMUNITY): Payer: Self-pay | Admitting: Obstetrics and Gynecology

## 2024-08-04 ENCOUNTER — Telehealth: Payer: Self-pay | Admitting: Neurology

## 2024-08-04 ENCOUNTER — Other Ambulatory Visit

## 2024-08-04 DIAGNOSIS — Z6833 Body mass index (BMI) 33.0-33.9, adult: Secondary | ICD-10-CM | POA: Insufficient documentation

## 2024-08-04 DIAGNOSIS — N840 Polyp of corpus uteri: Secondary | ICD-10-CM | POA: Diagnosis not present

## 2024-08-04 DIAGNOSIS — Z1501 Genetic susceptibility to malignant neoplasm of breast: Secondary | ICD-10-CM | POA: Diagnosis not present

## 2024-08-04 DIAGNOSIS — E669 Obesity, unspecified: Secondary | ICD-10-CM | POA: Insufficient documentation

## 2024-08-04 DIAGNOSIS — Z808 Family history of malignant neoplasm of other organs or systems: Secondary | ICD-10-CM | POA: Diagnosis not present

## 2024-08-04 DIAGNOSIS — Z853 Personal history of malignant neoplasm of breast: Secondary | ICD-10-CM | POA: Insufficient documentation

## 2024-08-04 DIAGNOSIS — K219 Gastro-esophageal reflux disease without esophagitis: Secondary | ICD-10-CM | POA: Diagnosis not present

## 2024-08-04 DIAGNOSIS — Z79899 Other long term (current) drug therapy: Secondary | ICD-10-CM | POA: Diagnosis not present

## 2024-08-04 DIAGNOSIS — N939 Abnormal uterine and vaginal bleeding, unspecified: Secondary | ICD-10-CM | POA: Diagnosis present

## 2024-08-04 DIAGNOSIS — Z01818 Encounter for other preprocedural examination: Secondary | ICD-10-CM

## 2024-08-04 HISTORY — DX: Personal history of urinary calculi: Z87.442

## 2024-08-04 HISTORY — PX: DILATATION & CURRETTAGE/HYSTEROSCOPY WITH RESECTOCOPE: SHX5572

## 2024-08-04 HISTORY — PX: MYOSURE RESECTION: SHX7611

## 2024-08-04 LAB — POCT PREGNANCY, URINE: Preg Test, Ur: NEGATIVE

## 2024-08-04 SURGERY — DILATATION & CURETTAGE/HYSTEROSCOPY WITH RESECTOCOPE
Anesthesia: General | Site: Uterus

## 2024-08-04 MED ORDER — OXYCODONE HCL 5 MG PO TABS: 5.0000 mg | ORAL_TABLET | Freq: Once | ORAL | Status: DC | PRN

## 2024-08-04 MED ORDER — FENTANYL CITRATE (PF) 250 MCG/5ML IJ SOLN
INTRAMUSCULAR | Status: DC | PRN
  Administered 2024-08-04: 50 ug via INTRAVENOUS

## 2024-08-04 MED ORDER — CELECOXIB 200 MG PO CAPS
400.0000 mg | ORAL_CAPSULE | ORAL | Status: AC
  Administered 2024-08-04: 400 mg via ORAL

## 2024-08-04 MED ORDER — LIDOCAINE HCL (PF) 1 % IJ SOLN
INTRAMUSCULAR | Status: AC
  Filled 2024-08-04: qty 30

## 2024-08-04 MED ORDER — ONDANSETRON HCL 4 MG/2ML IJ SOLN
INTRAMUSCULAR | Status: AC
  Filled 2024-08-04: qty 2

## 2024-08-04 MED ORDER — KETOROLAC TROMETHAMINE 30 MG/ML IJ SOLN: 30.0000 mg | Freq: Once | INTRAMUSCULAR | Status: DC | PRN

## 2024-08-04 MED ORDER — CHLORHEXIDINE GLUCONATE 0.12 % MT SOLN
15.0000 mL | Freq: Once | OROMUCOSAL | Status: AC
  Administered 2024-08-04: 15 mL via OROMUCOSAL

## 2024-08-04 MED ORDER — FENTANYL CITRATE (PF) 100 MCG/2ML IJ SOLN
INTRAMUSCULAR | Status: AC
Start: 2024-08-04 — End: 2024-08-04
  Filled 2024-08-04: qty 2

## 2024-08-04 MED ORDER — ACETAMINOPHEN 500 MG PO TABS
ORAL_TABLET | ORAL | Status: AC
  Filled 2024-08-04: qty 2

## 2024-08-04 MED ORDER — DEXAMETHASONE SODIUM PHOSPHATE 10 MG/ML IJ SOLN
INTRAMUSCULAR | Status: DC | PRN
  Administered 2024-08-04: 10 mg via INTRAVENOUS

## 2024-08-04 MED ORDER — ORAL CARE MOUTH RINSE: 15.0000 mL | Freq: Once | OROMUCOSAL | Status: AC

## 2024-08-04 MED ORDER — LIDOCAINE 2% (20 MG/ML) 5 ML SYRINGE
INTRAMUSCULAR | Status: DC | PRN
  Administered 2024-08-04: 10 mg via INTRAVENOUS

## 2024-08-04 MED ORDER — SODIUM CHLORIDE 0.9 % IR SOLN
Status: DC | PRN
  Administered 2024-08-04: 3000 mL

## 2024-08-04 MED ORDER — PROPOFOL 10 MG/ML IV BOLUS
INTRAVENOUS | Status: AC
  Filled 2024-08-04: qty 20

## 2024-08-04 MED ORDER — ACETAMINOPHEN 500 MG PO TABS
1000.0000 mg | ORAL_TABLET | ORAL | Status: AC
  Administered 2024-08-04: 1000 mg via ORAL

## 2024-08-04 MED ORDER — AMISULPRIDE (ANTIEMETIC) 5 MG/2ML IV SOLN: 10.0000 mg | Freq: Once | INTRAVENOUS | Status: DC | PRN

## 2024-08-04 MED ORDER — DEXAMETHASONE SODIUM PHOSPHATE 10 MG/ML IJ SOLN
INTRAMUSCULAR | Status: AC
Start: 2024-08-04 — End: 2024-08-04
  Filled 2024-08-04: qty 1

## 2024-08-04 MED ORDER — ONDANSETRON HCL 4 MG/2ML IJ SOLN
INTRAMUSCULAR | Status: DC | PRN
  Administered 2024-08-04: 4 mg via INTRAVENOUS

## 2024-08-04 MED ORDER — LIDOCAINE HCL (PF) 1 % IJ SOLN
INTRAMUSCULAR | Status: DC | PRN
  Administered 2024-08-04: 10 mL

## 2024-08-04 MED ORDER — MEPERIDINE HCL 25 MG/ML IJ SOLN
6.2500 mg | INTRAMUSCULAR | Status: DC | PRN
  Filled 2024-08-04: qty 1

## 2024-08-04 MED ORDER — CELECOXIB 200 MG PO CAPS
ORAL_CAPSULE | ORAL | Status: AC
  Filled 2024-08-04: qty 2

## 2024-08-04 MED ORDER — SCOPOLAMINE 1 MG/3DAYS TD PT72SCOPOLAMINE 1 MG/3DAYS
MEDICATED_PATCH | TRANSDERMAL | Status: DC | PRN
Start: 2024-08-04 — End: 2024-08-04
  Administered 2024-08-04: 1 via TRANSDERMAL

## 2024-08-04 MED ORDER — PROPOFOL 500 MG/50ML IV EMUL
INTRAVENOUS | Status: DC | PRN
  Administered 2024-08-04: 125 ug/kg/min via INTRAVENOUS

## 2024-08-04 MED ORDER — LIDOCAINE 2% (20 MG/ML) 5 ML SYRINGE
INTRAMUSCULAR | Status: AC
  Filled 2024-08-04: qty 5

## 2024-08-04 MED ORDER — PROPOFOL 10 MG/ML IV BOLUS
INTRAVENOUS | Status: DC | PRN
  Administered 2024-08-04: 50 mg via INTRAVENOUS
  Administered 2024-08-04: 20 mg via INTRAVENOUS
  Administered 2024-08-04: 300 mg via INTRAVENOUS
  Administered 2024-08-04: 100 mg via INTRAVENOUS

## 2024-08-04 MED ORDER — CHLORHEXIDINE GLUCONATE 0.12 % MT SOLN
OROMUCOSAL | Status: AC
  Filled 2024-08-04: qty 15

## 2024-08-04 MED ORDER — HYDROMORPHONE HCL 1 MG/ML IJ SOLN: 0.2500 mg | INTRAMUSCULAR | Status: DC | PRN

## 2024-08-04 MED ORDER — ONDANSETRON HCL 4 MG/2ML IJ SOLN: 4.0000 mg | Freq: Once | INTRAMUSCULAR | Status: DC | PRN

## 2024-08-04 MED ORDER — LACTATED RINGERS IV SOLN: INTRAVENOUS | Status: DC

## 2024-08-04 MED ORDER — OXYCODONE HCL 5 MG/5ML PO SOLN: 5.0000 mg | Freq: Once | ORAL | Status: DC | PRN

## 2024-08-04 SURGICAL SUPPLY — 19 items
COVER MAYO STAND STRL (DRAPES) ×3 IMPLANT
DEVICE MYOSURE LITE (MISCELLANEOUS) IMPLANT
DEVICE MYOSURE REACH (MISCELLANEOUS) IMPLANT
DILATOR CANAL MILEX (MISCELLANEOUS) IMPLANT
GAUZE 4X4 16PLY ~~LOC~~+RFID DBL (SPONGE) IMPLANT
GLOVE BIO SURGEON STRL SZ7 (GLOVE) ×3 IMPLANT
GLOVE BIOGEL PI IND STRL 7.0 (GLOVE) ×3 IMPLANT
GOWN STRL REUS W/ TWL XL LVL3 (GOWN DISPOSABLE) ×3 IMPLANT
HIBICLENS CHG 4% 4OZ BTL (MISCELLANEOUS) ×3 IMPLANT
KIT PROCEDURE FLUENT (KITS) ×3 IMPLANT
KIT TURNOVER KIT B (KITS) ×3 IMPLANT
PACK VAGINAL MINOR WOMEN LF (CUSTOM PROCEDURE TRAY) ×3 IMPLANT
PAD OB MATERNITY 11 LF (PERSONAL CARE ITEMS) ×3 IMPLANT
PAD PREP 24X48 CUFFED NSTRL (MISCELLANEOUS) ×3 IMPLANT
SEAL ROD LENS SCOPE MYOSURE (ABLATOR) ×3 IMPLANT
SOL .9 NS 3000ML IRR UROMATIC (IV SOLUTION) ×3 IMPLANT
SYSTEM TISS REMOVAL MYOSURE XL (MISCELLANEOUS) IMPLANT
TOWEL GREEN STERILE (TOWEL DISPOSABLE) IMPLANT
TOWEL OR 17X24 6PK STRL BLUE (TOWEL DISPOSABLE) ×3 IMPLANT

## 2024-08-04 NOTE — Discharge Instructions (Signed)
     No ibuprofen , Advil , Aleve, Motrin , ketorolac , meloxicam, naproxen, or other NSAIDS until after 6:00 pm today if needed.     Post Anesthesia Home Care Instructions  Activity: Get plenty of rest for the remainder of the day. A responsible individual must stay with you for 24 hours following the procedure.  For the next 24 hours, DO NOT: -Drive a car -Advertising copywriter -Drink alcoholic beverages -Take any medication unless instructed by your physician -Make any legal decisions or sign important papers.  Meals: Start with liquid foods such as gelatin or soup. Progress to regular foods as tolerated. Avoid greasy, spicy, heavy foods. If nausea and/or vomiting occur, drink only clear liquids until the nausea and/or vomiting subsides. Call your physician if vomiting continues.  Special Instructions/Symptoms: Your throat may feel dry or sore from the anesthesia or the breathing tube placed in your throat during surgery. If this causes discomfort, gargle with warm salt water. The discomfort should disappear within 24 hours.  If you had a scopolamine  patch placed behind your ear for the management of post- operative nausea and/or vomiting:  1. The medication in the patch is effective for 72 hours, after which it should be removed.  Wrap patch in a tissue and discard in the trash. Wash hands thoroughly with soap and water. 2. You may remove the patch earlier than 72 hours if you experience unpleasant side effects which may include dry mouth, dizziness or visual disturbances. 3. Avoid touching the patch. Wash your hands with soap and water after contact with the patch.

## 2024-08-04 NOTE — H&P (Signed)
 H&P  48 y.o. G54P0020 female with endometrial polyps, hx of breast cancer (2018, completed tamoxifene therapy), known fibroids here for scheduled diagnostic hysteroscopy, D&C, polypectomy. Single.   No LMP recorded. (Menstrual status: Irregular Periods). Started on POP since IMB started. No bleeding since. Doing well today. No CP or SOB. No medical updates since her last appt with me.      Component Value Date/Time   DIAGPAP  01/09/2022 1438    - Negative for Intraepithelial Lesions or Malignancy (NILM)   DIAGPAP - Benign reactive/reparative changes 01/09/2022 1438   HPVHIGH Negative 01/09/2022 1438   ADEQPAP  01/09/2022 1438    Satisfactory for evaluation; transformation zone component PRESENT.    Birth control: POP Sexually active: Not currently    06/18/24 TVUS: Anteverted uterus, multiple intramural fibroids noted, largest measuring 15 mm.  Endometrium is fluid-filled with a filling defect - 15 x 10 mm with what appears to be a feeder vessel (polyp?).     Right ovary within normal limits.  Left ovary 19 x 11 mm complex cyst.     No adnexal masses, no free fluid.   No recent EMB   GYN HISTORY: Prior hyst myomectomy, 2019- benign   OB History  Gravida Para Term Preterm AB Living  2    2 0  SAB IAB Ectopic Multiple Live Births  1  1      # Outcome Date GA Lbr Len/2nd Weight Sex Type Anes PTL Lv  2 Ectopic           1 SAB            Past Medical History:  Diagnosis Date   Alopecia    Anemia    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    Breast cancer (HCC) 10/2016   Ectopic pregnancy    Family history of melanoma    Family history of stomach cancer    Fibroids    GERD (gastroesophageal reflux disease)    History of kidney stones    Personal history of radiation therapy    PONV (postoperative nausea and vomiting)    STD (sexually transmitted disease)    Chlamydia   Past Surgical History:  Procedure Laterality Date   BREAST BIOPSY Right 08/20/2023   US  RT BREAST  BX W LOC DEV 1ST LESION IMG BX SPEC US  GUIDE 08/20/2023 [facility]   BREAST LUMPECTOMY Left 2017   BREAST LUMPECTOMY WITH RADIOACTIVE SEED AND SENTINEL LYMPH NODE BIOPSY Left 12/18/2016   Procedure: RADIOACTIVE SEED GUIDED LEFT BREAST LUMPECTOMY WITH LEFT AXILLARY SENTINEL LYMPH NODE BIOPSY;  Surgeon: Debby Shipper, MD;  Location: [facility];  Service: General;  Laterality: Left;   DILATION AND CURETTAGE OF UTERUS     MYOMECTOMY N/A 01/14/2018   Procedure: ABDOMINAL MYOMECTOMY;  Surgeon: Rockney Evalene SQUIBB, MD;  Location: [facility];  Service: Gynecology;  Laterality: N/A;   POLYPECTOMY N/A 01/14/2018   Procedure: POLYPECTOMY;  Surgeon: Rockney Evalene SQUIBB, MD;  Location: [facility];  Service: Gynecology;  Laterality: N/A;   Allergies  Allergen Reactions   Penicillins Swelling    Has patient had a PCN reaction causing immediate rash, facial/tongue/throat swelling, SOB or lightheadedness with hypotension:unknown Has patient had a PCN reaction causing severe rash involving mucus membranes or skin necrosis: No Has patient had a PCN reaction that required hospitalization No Has patient had a PCN reaction occurring within the last 10 years: No If all of the above answers are NO, then may proceed with Cephalosporin use.  Bee Venom Hives and Swelling   Current Outpatient Medications  Medication Instructions   acetaminophen  (TYLENOL ) 500-1,000 mg, Every 6 hours PRN   Biotin  1 mg, Oral, Daily   estradiol  (ESTRACE  VAGINAL) 1 g, Vaginal, 3 times weekly   Ferrous Sulfate  (IRON ) 325 (65 Fe) MG TABS 1 tablet, Oral, 3 times daily   Multiple Vitamins-Minerals (ADULT GUMMY PO) 2 tablets, Daily   norethindrone  (MICRONOR ) 0.35 mg, Oral, Daily   omeprazole (PRILOSEC) 40 mg, Oral, Daily, Has not started yet    Safety Seal Miscellaneous MISC Medication name: Gel Minoxidil USP 10% and Clobetasol USP 0.05% Finasteride 0.1% - Apply to scalp every morning.   Specialty Vitamins Products (COLLAGEN ULTRA PO) Take by  mouth.   triamcinolone  ointment (KENALOG ) 0.1 % apply BID for 2 weeks on 2 weeks off. Repeat PRN for flares.   Vitamin D3 5,000 Units, Daily       PE  BP (!) 143/88   Pulse 94   Temp 98.9 F (37.2 C) (Oral)   Resp 16   Ht 5' 4 (1.626 m)   Wt 89.8 kg   SpO2 100%   BMI 33.99 kg/m    Physical Exam Vitals reviewed.  Constitutional:      General: She is not in acute distress.    Appearance: Normal appearance.  HENT:     Head: Normocephalic and atraumatic.     Nose: Nose normal.  Eyes:     Extraocular Movements: Extraocular movements intact.     Conjunctiva/sclera: Conjunctivae normal.  Cardiovascular:     Rate and Rhythm: Normal rate.  Pulmonary:     Effort: Pulmonary effort is normal. No respiratory distress.  Musculoskeletal:        General: Normal range of motion.     Cervical back: Normal range of motion.  Neurological:     General: No focal deficit present.     Mental Status: She is alert.  Psychiatric:        Mood and Affect: Mood normal.        Behavior: Behavior normal.      Assessment and Plan:         Endometrial polyp Abnormal uterine bleeding (AUB) Assessment & Plan: Plan for hysteroscopy, dilation and curettage.   Reviewed that  recovery is usually 1-2 days. Risks including infections, bleeding, and damage to surrounding organs reviewed. Dicussed use of NSAIDS as needed for pain postoperatively.   Preop checklist: Antibiotics: none DVT ppx: SCDs Postop visit: 1 week Additional clearance: none      Plan for surveillance of complex ovarian cyst with T. Prentiss.   Vera LULLA Pa, MD

## 2024-08-04 NOTE — Transfer of Care (Signed)
 Immediate Anesthesia Transfer of Care Note  Patient: Donna Matthews  Procedure(s) Performed: DILATATION & CURETTAGE/HYSTEROSCOPY (Uterus) MYOSURE RESECTION (Uterus)  Patient Location: PACU  Anesthesia Type:General  Level of Consciousness: awake and oriented  Airway & Oxygen Therapy: Patient Spontanous Breathing and Patient connected to face mask oxygen  Post-op Assessment: Report given to RN and Post -op Vital signs reviewed and stable  Post vital signs: Reviewed and stable  Last Vitals:  Vitals Value Taken Time  BP 127/82 08/04/24 14:50  Temp 36.5 C 08/04/24 14:50  Pulse 86 08/04/24 14:53  Resp 19 08/04/24 14:53  SpO2 100 % 08/04/24 14:53  Vitals shown include unfiled device data.  Last Pain:  Vitals:   08/04/24 1148  TempSrc: Oral  PainSc: 0-No pain      Patients Stated Pain Goal: 4 (08/04/24 1148)  Complications: No notable events documented.

## 2024-08-04 NOTE — Op Note (Signed)
 08/04/24  Donna Matthews [facility] [id number]  OPERATIVE REPORT  Preop Diagnosis: Pre-Op Diagnosis Codes:     * Endometrial polyp [N84.0]    * Abnormal uterine bleeding (AUB) [N93.9]   Post operative diagnosis: same  Procedure: Procedure(s): DILATATION & CURETTAGE/HYSTEROSCOPY, Polypectomy via MYOSURE  Surgeon: Vera LULLA Pa, MD Assistant: none  Anesthesia: MAC Fluids: please see anesthesia report Fluid deficit: 165cc  Complications: None  Findings: Normal cervix. Uterine cavity measuring 12cm with 2 endometrial polyps noted. Both ostia seen.  Estimated blood loss: Minimal  Specimens:  ID Type Source Tests Collected by Time Destination  1 : endometrial polyp Tissue PATH Gyn biopsy SURGICAL PATHOLOGY Pa Vera LULLA, MD 08/04/2024 1430   2 : endometrial curettings Tissue PATH Gyn biopsy SURGICAL PATHOLOGY Pa Vera LULLA, MD 08/04/2024 1430     Disposition of specimen: Pathology    Procedure: Patient was taken to the OR where she was placed in dorsal lithotomy in stirrups. She voided prior to transport. SCDs were in place.  The patient was prepped and draped in the usual sterile fashion. An adequate timeout was obtained and everyone agreed. A bivalve speculum was placed inside the vagina and the cervix visualized. The cervix was grasped anteriorly with a single-tooth tenaculum. Paracervical block was performed with 1% lidocaine .  The uterus sounded to 12 cm. Sequential cervical dilation was done to 71fr, and the myosure hysteroscope was introduced into the uterine cavity. The cervix and endometrial lining appeared normal both ostia were seen. Findings as noted. Small polyps were seen and removed using the myosure.  The myosure was also used to sample the entire cavity.  A sharp curettage was then performed to ensure complete sampling of the cavity and was sent to pathology. All instrument, sponge and lap counts were correct x2. The patient was awakened taken to recovery room in stable  condition.  Vera LULLA Pa, MD 08/04/24  2:41 PM

## 2024-08-04 NOTE — Anesthesia Preprocedure Evaluation (Addendum)
 Anesthesia Evaluation  Patient identified by MRN, date of birth, ID band Patient awake    Reviewed: Allergy & Precautions, NPO status , Patient's Chart, lab work & pertinent test results  History of Anesthesia Complications (+) PONV and history of anesthetic complications (did well with gas last time, got decadron /zofran /reglan )  Airway Mallampati: II  TM Distance: >3 FB Neck ROM: Full    Dental  (+) Teeth Intact, Dental Advisory Given   Pulmonary neg pulmonary ROS   Pulmonary exam normal breath sounds clear to auscultation       Cardiovascular negative cardio ROS Normal cardiovascular exam Rhythm:Regular Rate:Normal     Neuro/Psych negative neurological ROS  negative psych ROS   GI/Hepatic Neg liver ROS,GERD  Controlled and Medicated,,  Endo/Other  Obesity BMI 34  Renal/GU negative Renal ROS  negative genitourinary   Musculoskeletal negative musculoskeletal ROS (+)    Abdominal  (+) + obese  Peds  Hematology negative hematology ROS (+)   Anesthesia Other Findings   Reproductive/Obstetrics AUB                              Anesthesia Physical Anesthesia Plan  ASA: 2  Anesthesia Plan: General   Post-op Pain Management: Tylenol  PO (pre-op)* and Toradol  IV (intra-op)*   Induction: Intravenous  PONV Risk Score and Plan: 4 or greater and Ondansetron , Dexamethasone , Propofol  infusion, TIVA, Midazolam  and Treatment may vary due to age or medical condition  Airway Management Planned: LMA  Additional Equipment: None  Intra-op Plan:   Post-operative Plan: Extubation in OR  Informed Consent: I have reviewed the patients History and Physical, chart, labs and discussed the procedure including the risks, benefits and alternatives for the proposed anesthesia with the patient or authorized representative who has indicated his/her understanding and acceptance.     Dental advisory  given  Plan Discussed with: CRNA  Anesthesia Plan Comments:          Anesthesia Quick Evaluation

## 2024-08-04 NOTE — Anesthesia Procedure Notes (Signed)
 Procedure Name: LMA Insertion Date/Time: 08/04/2024 2:05 PM  Performed by: Kearney Rosina SAILOR, RNPre-anesthesia Checklist: Patient identified, Patient being monitored, Timeout performed, Emergency Drugs available and Suction available Patient Re-evaluated:Patient Re-evaluated prior to induction Oxygen Delivery Method: Circle system utilized Preoxygenation: Pre-oxygenation with 100% oxygen Induction Type: IV induction Ventilation: Mask ventilation without difficulty LMA: LMA inserted LMA Size: 4.0 Tube type: Oral Number of attempts: 1 Placement Confirmation: positive ETCO2 and breath sounds checked- equal and bilateral Tube secured with: Tape Dental Injury: Teeth and Oropharynx as per pre-operative assessment  Comments: atraumatic

## 2024-08-04 NOTE — Telephone Encounter (Signed)
 HST- [employer] no auth req   Patient is scheduled at [facility] for 08/25/24 at 3:30 pm. Mailed packet

## 2024-08-04 NOTE — Anesthesia Postprocedure Evaluation (Signed)
 Anesthesia Post Note  Patient: Donna Matthews  Procedure(s) Performed: DILATATION & CURETTAGE/HYSTEROSCOPY (Uterus) MYOSURE RESECTION (Uterus)     Patient location during evaluation: PACU Anesthesia Type: General Level of consciousness: awake and alert, oriented and patient cooperative Pain management: pain level controlled Vital Signs Assessment: post-procedure vital signs reviewed and stable Respiratory status: spontaneous breathing, nonlabored ventilation and respiratory function stable Cardiovascular status: blood pressure returned to baseline and stable Postop Assessment: no apparent nausea or vomiting Anesthetic complications: no   No notable events documented.  Last Vitals:  Vitals:   08/04/24 1148 08/04/24 1450  BP: (!) 143/88 127/82  Pulse: 94 86  Resp: 16 19  Temp: 37.2 C 36.5 C  SpO2: 100% 100%    Last Pain:  Vitals:   08/04/24 1148  TempSrc: Oral  PainSc: 0-No pain                 Almarie CHRISTELLA Marchi

## 2024-08-05 ENCOUNTER — Encounter (HOSPITAL_COMMUNITY): Payer: Self-pay | Admitting: Obstetrics and Gynecology

## 2024-08-05 ENCOUNTER — Telehealth: Payer: Self-pay

## 2024-08-05 NOTE — Telephone Encounter (Signed)
 Patient called triage line. She had surgery yesterday (DILATATION & CURETTAGE/HYSTEROSCOPY, Polypectomy via MYOSURE). Patient would like to know when she can take a shower & how much can she lift right now. Routing to Dr Dallie.

## 2024-08-06 ENCOUNTER — Ambulatory Visit: Payer: Self-pay | Admitting: Obstetrics and Gynecology

## 2024-08-06 LAB — SURGICAL PATHOLOGY

## 2024-08-06 NOTE — Telephone Encounter (Signed)
 Patient notified

## 2024-08-13 ENCOUNTER — Encounter: Payer: Self-pay | Admitting: Nurse Practitioner

## 2024-08-13 ENCOUNTER — Ambulatory Visit (INDEPENDENT_AMBULATORY_CARE_PROVIDER_SITE_OTHER)

## 2024-08-13 ENCOUNTER — Ambulatory Visit (INDEPENDENT_AMBULATORY_CARE_PROVIDER_SITE_OTHER): Admitting: Nurse Practitioner

## 2024-08-13 VITALS — BP 142/90 | HR 78 | Ht 64.0 in | Wt 196.0 lb

## 2024-08-13 DIAGNOSIS — N83299 Other ovarian cyst, unspecified side: Secondary | ICD-10-CM

## 2024-08-13 NOTE — Progress Notes (Signed)
   Acute Office Visit  Subjective:    Patient ID: Donna Matthews, female    DOB: [birth year], 48 y.o.   MRN: [id number]   HPI 48 y.o. presents today for ultrasound. Complex left ovarian cyst 19 x 11 mm seen on US  06/18/24. Since then has had D&C with myosure. Doing well. Having some spotting. Taking POPs.   No LMP recorded. (Menstrual status: Irregular Periods).    Review of Systems  Constitutional: Negative.   Genitourinary:  Positive for vaginal bleeding.       Objective:    Physical Exam Constitutional:      Appearance: Normal appearance.   GU: Not indicated  BP (!) 142/90 (BP Location: Right Arm, Patient Position: Sitting, Cuff Size: Normal)   Pulse 78   Ht 5' 4 (1.626 m)   Wt 196 lb (88.9 kg)   SpO2 98%   BMI 33.64 kg/m  Wt Readings from Last 3 Encounters:  08/13/24 196 lb (88.9 kg)  08/04/24 198 lb (89.8 kg)  07/28/24 198 lb (89.8 kg)       Assessment & Plan:   Problem List Items Addressed This Visit   None Visit Diagnoses       Complex ovarian cyst    -  Primary      Vaginal ultrasound (comparison is made to 06/18/24 ultrasound): Anteverted uterus, normal size and shape, fibroids noted (see previous report).  Thin, symmetrical endometrium.  No masses or thickening seen.  Sliver of free fluid seen in canal, avascular.  Both ovaries within normal limits (cyst resolved).  No adnexal masses, no free fluid.  Plan: Reviewed ultrasound and resolution of complex cyst. Has post op follow up next week with Dr. Dallie.   Return if symptoms worsen or fail to improve.    [name] DNP, 10:59 AM 08/13/2024

## 2024-08-17 ENCOUNTER — Ambulatory Visit

## 2024-08-17 ENCOUNTER — Ambulatory Visit
Admission: RE | Admit: 2024-08-17 | Discharge: 2024-08-17 | Disposition: A | Discharge status: Home / Self Care | Source: Ambulatory Visit | Attending: Hematology and Oncology | Admitting: Hematology and Oncology

## 2024-08-17 DIAGNOSIS — Z1231 Encounter for screening mammogram for malignant neoplasm of breast: Secondary | ICD-10-CM

## 2024-08-18 ENCOUNTER — Ambulatory Visit (INDEPENDENT_AMBULATORY_CARE_PROVIDER_SITE_OTHER): Admitting: Obstetrics and Gynecology

## 2024-08-18 ENCOUNTER — Encounter: Payer: Self-pay | Admitting: Obstetrics and Gynecology

## 2024-08-18 ENCOUNTER — Encounter: Payer: Self-pay | Admitting: Neurology

## 2024-08-18 VITALS — BP 136/80 | HR 81 | Temp 98.5°F | Ht 64.0 in | Wt 196.0 lb

## 2024-08-18 DIAGNOSIS — N840 Polyp of corpus uteri: Secondary | ICD-10-CM

## 2024-08-18 DIAGNOSIS — Z09 Encounter for follow-up examination after completed treatment for conditions other than malignant neoplasm: Secondary | ICD-10-CM

## 2024-08-18 NOTE — Progress Notes (Signed)
 48 y.o. G26P0020 female with endometrial polyps, hx of breast cancer (2018, completed tamoxifene therapy), known fibroids here for postop exam for diag hysteroscopy, D&C, polypectomy. Single.  Patient's last menstrual period was 05/10/2024.   Pt states she is doing well. Denies N/V, abdominal pain, VB, dysuria. Normal BM and voids.   08/04/24 path: A. ENDOMETRIUM, POLYPECTOMY:  - Benign endometrial polyp   B. ENDOMETRIUM, CURETTAGE:  - Predominantly benign endocervical mucosa with mucoid hemorrhagic  debris with scant fragments of superficial benign endometrium  - No malignancy identified   Last PAP:    Component Value Date/Time   DIAGPAP  01/09/2022 1438    - Negative for Intraepithelial Lesions or Malignancy (NILM)   DIAGPAP - Benign reactive/reparative changes 01/09/2022 1438   HPVHIGH Negative 01/09/2022 1438   ADEQPAP  01/09/2022 1438    Satisfactory for evaluation; transformation zone component PRESENT.   Birth control: POP Sexually active: Not currently   GYN HISTORY: Prior hyst myomectomy, 2019- benign  OB History  Gravida Para Term Preterm AB Living  2    2 0  SAB IAB Ectopic Multiple Live Births  1  1      # Outcome Date GA Lbr Len/2nd Weight Sex Type Anes PTL Lv  2 Ectopic           1 SAB             Past Medical History:  Diagnosis Date   Alopecia    Anemia    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    Breast cancer (HCC) 10/2016   Ectopic pregnancy    Family history of melanoma    Family history of stomach cancer    Fibroids    GERD (gastroesophageal reflux disease)    History of kidney stones    Personal history of radiation therapy    PONV (postoperative nausea and vomiting)    STD (sexually transmitted disease)    Chlamydia    Past Surgical History:  Procedure Laterality Date   BREAST BIOPSY Right 08/20/2023   US  RT BREAST BX W LOC DEV 1ST LESION IMG BX SPEC US  GUIDE 08/20/2023 [facility]   BREAST LUMPECTOMY Left 2017   BREAST  LUMPECTOMY WITH RADIOACTIVE SEED AND SENTINEL LYMPH NODE BIOPSY Left 12/18/2016   Procedure: RADIOACTIVE SEED GUIDED LEFT BREAST LUMPECTOMY WITH LEFT AXILLARY SENTINEL LYMPH NODE BIOPSY;  Surgeon: Debby Shipper, MD;  Location: [facility] OR;  Service: General;  Laterality: Left;   DILATATION & CURRETTAGE/HYSTEROSCOPY WITH RESECTOCOPE N/A 08/04/2024   Procedure: DILATATION & CURETTAGE/HYSTEROSCOPY;  Surgeon: Dallie Vera GAILS, MD;  Location: [facility] OR;  Service: Gynecology;  Laterality: N/A;  Myosure   DILATION AND CURETTAGE OF UTERUS     MYOMECTOMY N/A 01/14/2018   Procedure: ABDOMINAL MYOMECTOMY;  Surgeon: Rockney Evalene SQUIBB, MD;  Location: [facility];  Service: Gynecology;  Laterality: N/A;   MYOSURE RESECTION  08/04/2024   Procedure: MELINDA RESECTION;  Surgeon: Dallie Vera GAILS, MD;  Location: [facility];  Service: Gynecology;;   POLYPECTOMY N/A 01/14/2018   Procedure: POLYPECTOMY;  Surgeon: Rockney Evalene SQUIBB, MD;  Location: [facility];  Service: Gynecology;  Laterality: N/A;    Current Outpatient Medications on File Prior to Visit  Medication Sig Dispense Refill   acetaminophen  (TYLENOL ) 500 MG tablet Take 500-1,000 mg by mouth every 6 (six) hours as needed (FOR PAIN).     Cholecalciferol  (VITAMIN D3) 5000 units CAPS Take 5,000 Units by mouth daily.     Ferrous Sulfate  (IRON ) 325 (65 Fe)  MG TABS Take 1 tablet by mouth 3 (three) times daily. 30 each 0   norethindrone  (MICRONOR ) 0.35 MG tablet Take 1 tablet (0.35 mg total) by mouth daily. 84 tablet 0   Safety Seal Miscellaneous MISC Medication name: Gel Minoxidil USP 10% and Clobetasol USP 0.05% Finasteride 0.1% - Apply to scalp every morning. 30 g 2   Specialty Vitamins Products (COLLAGEN ULTRA PO) Take by mouth.     triamcinolone  ointment (KENALOG ) 0.1 % apply BID for 2 weeks on 2 weeks off. Repeat PRN for flares. 80 g 2   Biotin  1 MG CAPS Take 1 mg by mouth daily. (Patient not taking: Reported on 08/18/2024)     estradiol  (ESTRACE  VAGINAL) 0.1 MG/GM vaginal cream Place  1 g vaginally 3 (three) times a week. (Patient not taking: Reported on 08/18/2024) 42.5 g 3   Multiple Vitamins-Minerals (ADULT GUMMY PO) Take 2 tablets by mouth daily. (Patient not taking: Reported on 08/18/2024)     omeprazole (PRILOSEC) 40 MG capsule Take 40 mg by mouth daily. Has not started yet (Patient not taking: Reported on 08/18/2024)     No current facility-administered medications on file prior to visit.    Allergies  Allergen Reactions   Penicillins Swelling    Has patient had a PCN reaction causing immediate rash, facial/tongue/throat swelling, SOB or lightheadedness with hypotension:unknown Has patient had a PCN reaction causing severe rash involving mucus membranes or skin necrosis: No Has patient had a PCN reaction that required hospitalization No Has patient had a PCN reaction occurring within the last 10 years: No If all of the above answers are NO, then may proceed with Cephalosporin use.    Bee Venom Hives and Swelling      PE Today's Vitals   08/18/24 1443  BP: (!) 142/100  Pulse: 81  Temp: 98.5 F (36.9 C)  TempSrc: Oral  SpO2: 98%  Weight: 196 lb (88.9 kg)  Height: 5' 4 (1.626 m)   Body mass index is 33.64 kg/m.  Physical Exam Vitals reviewed.  Constitutional:      General: She is not in acute distress.    Appearance: Normal appearance.  HENT:     Head: Normocephalic and atraumatic.     Nose: Nose normal.  Eyes:     Extraocular Movements: Extraocular movements intact.     Conjunctiva/sclera: Conjunctivae normal.  Pulmonary:     Effort: Pulmonary effort is normal.  Musculoskeletal:        General: Normal range of motion.     Cervical back: Normal range of motion.  Neurological:     General: No focal deficit present.     Mental Status: She is alert.  Psychiatric:        Mood and Affect: Mood normal.        Behavior: Behavior normal.      Assessment and Plan:        Postop check  Endometrial polyp  Normal postop exam.  Pathology  reviewed with patient. Cleared to return to work. All questions answered.  Vera LULLA Pa, MD

## 2024-08-19 ENCOUNTER — Encounter: Payer: Self-pay | Admitting: Neurology

## 2024-08-22 ENCOUNTER — Ambulatory Visit
Admission: RE | Admit: 2024-08-22 | Discharge: 2024-08-22 | Disposition: A | Discharge status: Home / Self Care | Source: Ambulatory Visit | Attending: Neurology | Admitting: Neurology

## 2024-08-22 ENCOUNTER — Other Ambulatory Visit: Payer: Self-pay | Admitting: Neurology

## 2024-08-22 DIAGNOSIS — R519 Headache, unspecified: Secondary | ICD-10-CM

## 2024-08-22 DIAGNOSIS — Z853 Personal history of malignant neoplasm of breast: Secondary | ICD-10-CM

## 2024-08-22 DIAGNOSIS — G478 Other sleep disorders: Secondary | ICD-10-CM

## 2024-08-25 ENCOUNTER — Ambulatory Visit (INDEPENDENT_AMBULATORY_CARE_PROVIDER_SITE_OTHER): Admitting: Neurology

## 2024-08-25 DIAGNOSIS — G4733 Obstructive sleep apnea (adult) (pediatric): Secondary | ICD-10-CM

## 2024-08-25 DIAGNOSIS — Z853 Personal history of malignant neoplasm of breast: Secondary | ICD-10-CM

## 2024-08-25 DIAGNOSIS — R519 Headache, unspecified: Secondary | ICD-10-CM

## 2024-08-25 DIAGNOSIS — G478 Other sleep disorders: Secondary | ICD-10-CM

## 2024-08-27 ENCOUNTER — Ambulatory Visit: Payer: Self-pay | Admitting: Neurology

## 2024-08-27 DIAGNOSIS — Z853 Personal history of malignant neoplasm of breast: Secondary | ICD-10-CM

## 2024-08-27 DIAGNOSIS — G478 Other sleep disorders: Secondary | ICD-10-CM

## 2024-08-27 DIAGNOSIS — R519 Headache, unspecified: Secondary | ICD-10-CM

## 2024-08-27 NOTE — Progress Notes (Signed)
 [facility]  Donna Matthews 48 year old female August 10, 1976   HOME SLEEP TEST REPORT ( by Watch PAT)   STUDY DATE:   08-25-2024    ORDERING CLINICIAN: Dedra Gores, MD  REFERRING CLINICIAN: Lyle hester, PA  [name], [name]    CLINICAL INFORMATION/HISTORY: Donna Matthews is a 48 y.o. female patient who is seen upon  PCP referral on 07/28/2024  for an evaluation of  headaches and  reports she stopped a medication in April that caused dizziness.  Dizziness is not longer a problem but  sleepiness is.  Menopausal symptoms  on tamoxifen , has still hot flashes, nightly woken by sweat, feeling hot, but no palpitations.     The headaches can be frontal , can be sharp, but most headaches are dull, in am upon waking up- and don't last long . Headaches feel not predictable, and can occur at any time of the day.  No nausea with these headaches.  No vision changes, no aura,.  These headaches are a new onset.  Headaches occur 5 /months, and they last only moments 5 minutes or less, they,came and go.  No residual  symptoms.  Her sleep is not as restorative and she sleeps barely 7 hours.  Sleep relevant medical history: Nocturia 1-3 times , 48 y.o. year old female  here with:   New onset headaches at age 34 in a breast cancer survivor.  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 Menopausal symptoms interfered with sleep quality      Epworth sleepiness score: 10/ 24 points . FSS endorsed at 18/ 63 points.    BMI: 34 kg/m   Neck Circumference: 16'       Sleep Summary:   Total Recording Time (hours, min):    7 hours 52 minutes   Total Sleep Time (hours, min):     6 hours 4 minutes            Percent REM (%): 21.5%                                      Respiratory Indices:   Calculated pAHI (per hour):    By AASM criteria the AHI was 6.4/h.                         REM pAHI:    22.4/h                                              NREM pAHI:   3/h                           Positional  AHI: 9.8 in supine position and 5.2/h in nonsupine position.                                                  Oxygen Saturation Statistics:   Oxygen Saturation (%) Mean:   94% with a nadir at 90% with a maximum saturation  of 97% and 0 minutes of total hypoxia time, defined as O2 Saturation (minutes) <89%:           Pulse Rate Statistics:   Pulse Mean (bpm):   72 bpm              Pulse Range:   Between 57 and 93 bpm Caveat: the watch pat device does not provide data of cardiac rhythm.              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  There was no associated hypoxia of clinical significance.  This mild apnea was also more pronounced when sleeping supine versus in lateral position or prone.     RECOMMENDATION:   While I consider CPAP and optional treatment for such mild degree of apnea, I do want the patient to consider a medically guided weight loss program which likely could reduce her REM sleep dependent apnea to normal levels.  She should also avoid sleeping on her back.  CPAP is optional, I can provide a CPAP device if the patient chooses to but I would not consider it imperative for this mild apnea.    INTERPRETING PHYSICIAN:  Dedra Gores, MD

## 2024-09-02 ENCOUNTER — Ambulatory Visit: Payer: Self-pay | Admitting: Neurology

## 2024-09-02 NOTE — Procedures (Signed)
 [facility]  Donna Matthews 48 year old female 1976/11/24   HOME SLEEP TEST REPORT ( by Watch PAT)   STUDY DATE:   08-25-2024    ORDERING CLINICIAN: Dedra Gores, MD  REFERRING CLINICIAN: Lyle hester, PA  [name], [name]    CLINICAL INFORMATION/HISTORY: Donna Matthews is a 48 y.o. female patient who is seen upon  PCP referral on 07/28/2024  for an evaluation of  headaches and  reports she stopped a medication in April that caused dizziness.  Dizziness is not longer a problem but  sleepiness is.  Menopausal symptoms  on tamoxifen , has still hot flashes, nightly woken by sweat, feeling hot, but no palpitations.     The headaches can be frontal , can be sharp, but most headaches are dull, in am upon waking up- and don't last long . Headaches feel not predictable, and can occur at any time of the day.  No nausea with these headaches.  No vision changes, no aura,.  These headaches are a new onset.  Headaches occur 5 /months, and they last only moments 5 minutes or less, they,came and go.  No residual  symptoms.  Her sleep is not as restorative and she sleeps barely 7 hours.  Sleep relevant medical history: Nocturia 1-3 times , 48 y.o. year old female  here with:   New onset headaches at age 31 in a breast cancer survivor.  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 Menopausal symptoms interfered with sleep quality      Epworth sleepiness score: 10/ 24 points . FSS endorsed at 18/ 63 points.    BMI: 34 kg/m   Neck Circumference: 16'       Sleep Summary:   Total Recording Time (hours, min):    7 hours 52 minutes   Total Sleep Time (hours, min):     6 hours 4 minutes            Percent REM (%): 21.5%                                      Respiratory Indices:   Calculated pAHI (per hour):    By AASM criteria the AHI was 6.4/h.                         REM pAHI:    22.4/h                                              NREM pAHI:   3/h                           Positional  AHI: 9.8/h  in supine position and 5.2/h in nonsupine position.                                                  Oxygen Saturation Statistics:   Oxygen Saturation (%) Mean:   94% with a nadir at 90% with a maximum saturation  of 97% and 0 minutes of total hypoxia time, defined as O2 Saturation (minutes) <89%:           Pulse Rate Statistics:   Pulse Mean (bpm):   72 bpm              Pulse Range:   Between 57 and 93 bpm Caveat: the watch pat device does not provide data of cardiac rhythm.              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  There was no associated hypoxia of clinical significance.  This mild apnea was also more pronounced when sleeping supine versus in lateral position or prone.     RECOMMENDATION:   While I consider CPAP and optional treatment for such mild degree of apnea, I do want the patient to consider a medically guided weight loss program which likely could reduce her REM sleep dependent apnea to normal levels.  She should also avoid sleeping on her back.  CPAP is optional, I can provide a CPAP device if the patient chooses to but I would not consider it imperative for this mild apnea.  I doubt that this obstructive sleep  apnea contributes to an underlying headache disorder.     INTERPRETING PHYSICIAN:  Dedra Gores, MD

## 2024-09-03 ENCOUNTER — Telehealth: Payer: Self-pay | Admitting: Neurology

## 2024-09-03 NOTE — Telephone Encounter (Signed)
 I called pt back and LMVM for her to call back relating to location of spinal tap  (is [facility] and not [facility]) and also doing pre and post PT when having the spinal tap.  Also with her HST, Dr. Chalice also recommended to consider medically guided weight loss program.

## 2024-09-03 NOTE — Telephone Encounter (Signed)
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  I recommend a spinal tap to verify if the opening pressure is high. If opening pressure is high, need to start Diamox or Topiramate as fluid reducing medications,  NP Lyle Bertrand referred this patient.   ----- Message ----- From: Vear Charlie LABOR, MD Sent: 08/23/2024   1:31 PM EDT To: Dedra Gores, MD

## 2024-09-03 NOTE — Telephone Encounter (Signed)
 Referral for Neurology faxed to [facility])  [facility]) Phone: [phone] Fax [phone]

## 2024-09-03 NOTE — Telephone Encounter (Signed)
 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  I recommend a spinal tap to verify if the opening pressure is high. If opening pressure is high, need to start Diamox or Topiramate as fluid reducing medications.  Relayed that [facility] to call her to schedule.  Will need driver.   Explained process to her relating to procedure and aftercare.  She verbalized understanding.  Also relayed the HST results as well.  Orders placed to [facility] for LP and PT pre and post walking per Dr. Chalice.   RECOMMENDATION:   While I consider CPAP and optional treatment for such mild degree of apnea, I do want the patient to consider a medically guided weight loss program which likely could reduce her REM sleep dependent apnea to normal levels.  She should also avoid sleeping on her back.   CPAP is optional, I can provide a CPAP device if the patient chooses to but I would not consider it imperative for this mild apnea.  I doubt that this obstructive sleep  apnea contributes to an underlying headache disorder. Pt stated that she was told that when in with Dr. Dawayne.  I said that weight loss would be helpful in both issues.  She asked for results sent to her as she did not want Designer, industrial/product.  I would send to Adrien in [place].

## 2024-09-04 NOTE — Telephone Encounter (Signed)
 Pt called . Returning call  informed that  Pt will get call back

## 2024-09-07 ENCOUNTER — Other Ambulatory Visit: Payer: Self-pay | Admitting: Nurse Practitioner

## 2024-09-07 DIAGNOSIS — N939 Abnormal uterine and vaginal bleeding, unspecified: Secondary | ICD-10-CM

## 2024-09-07 NOTE — Telephone Encounter (Signed)
 error

## 2024-09-07 NOTE — Telephone Encounter (Signed)
 I called pt.  I relayed that to her about the PT pre and post LP, Dr. Deedra pt also do at [facility].  I relayed as well the recommendation for her to consider a medically guided weight loss program which could reduce her REM sleep dependent apnea to normal levels and she should avoid sleping on her back.  I told her that will send results to her pcp concerning weight loss program recommendation as well.

## 2024-09-07 NOTE — Telephone Encounter (Signed)
 Med refill request: MICRONOR   Last AEX: 02/26/24 Next AEX: not scheduled  Last MMG (if hormonal med)  Refill authorized: last rx 06/18/24 #[id number] with 0 refills. Please approve or deny

## 2024-09-07 NOTE — Telephone Encounter (Signed)
-----   Message from Zelda KATHEE Louder sent at 09/07/2024 11:17 AM EDT -----  Patient returned phone call, would like a call back.

## 2024-09-14 ENCOUNTER — Other Ambulatory Visit: Payer: Self-pay | Admitting: Student

## 2024-09-14 DIAGNOSIS — Z01818 Encounter for other preprocedural examination: Secondary | ICD-10-CM

## 2024-09-23 ENCOUNTER — Other Ambulatory Visit: Payer: Self-pay

## 2024-09-23 ENCOUNTER — Ambulatory Visit: Payer: Self-pay | Admitting: Neurology

## 2024-09-23 ENCOUNTER — Ambulatory Visit (HOSPITAL_COMMUNITY)
Admission: RE | Admit: 2024-09-23 | Discharge: 2024-09-23 | Disposition: A | Discharge status: Home / Self Care | Source: Ambulatory Visit | Attending: Neurology | Admitting: Neurology

## 2024-09-23 ENCOUNTER — Ambulatory Visit (HOSPITAL_COMMUNITY)
Admission: RE | Admit: 2024-09-23 | Discharge: 2024-09-23 | Disposition: A | Source: Ambulatory Visit | Attending: Family Medicine | Admitting: Family Medicine

## 2024-09-23 DIAGNOSIS — G478 Other sleep disorders: Secondary | ICD-10-CM | POA: Insufficient documentation

## 2024-09-23 DIAGNOSIS — Z853 Personal history of malignant neoplasm of breast: Secondary | ICD-10-CM | POA: Diagnosis present

## 2024-09-23 DIAGNOSIS — R519 Headache, unspecified: Secondary | ICD-10-CM | POA: Insufficient documentation

## 2024-09-23 LAB — CSF CELL COUNT WITH DIFFERENTIAL
RBC Count, CSF: 5 /mm3 — ABNORMAL HIGH
Tube #: 1
WBC, CSF: 0 /mm3 (ref 0–5)

## 2024-09-23 LAB — PROTEIN AND GLUCOSE, CSF
Glucose, CSF: 67 mg/dL (ref 40–70)
Total  Protein, CSF: 20 mg/dL (ref 15–45)

## 2024-09-23 MED ORDER — ACETAZOLAMIDE 250 MG PO TABS: 250.0000 mg | ORAL_TABLET | Freq: Two times a day (BID) | ORAL | 5 refills | Status: AC

## 2024-09-23 MED ORDER — LIDOCAINE 1 % OPTIME INJ - NO CHARGE
5.0000 mL | Freq: Once | INTRAMUSCULAR | Status: AC
  Administered 2024-09-23: 10 mL via INTRADERMAL
  Filled 2024-09-23: qty 6

## 2024-09-23 NOTE — Discharge Instructions (Signed)
 Myelogram and Lumbar Puncture Discharge Instructions  Go home and rest quietly for the next 24 hours.  It is important to lie flat for the next 24 hours.  Get up only to go to the restroom.  You may lie in the bed or on a couch on your back, your stomach, your left side or your right side.  You may have one pillow under your head.  You may have pillows between your knees while you are on your side or under your knees while you are on your back.  DO NOT drive today.  Recline the seat as far back as it will go, while still wearing your seat belt, on the way home.  You may get up to go to the bathroom as needed.  You may sit up for 10 minutes to eat.  You may resume your normal diet and medications unless otherwise indicated.  The incidence of headache, nausea, or vomiting is about 5% (one in 20 patients).  If you develop a headache, lie flat and drink plenty of fluids until the headache goes away.  Caffeinated beverages may be helpful.  If you develop severe nausea and vomiting or a headache that does not go away with flat bed rest, call [phone].  You may resume normal activities after your 24 hours of bed rest is over; however, do not exert yourself strongly or do any heavy lifting tomorrow.  Call your physician for a follow-up appointment.  The results of your myelogram will be sent directly to your physician by the following day.   Discharge instructions have been explained to the patient.  The patient, or the person responsible for the patient, fully understands these instructions.

## 2024-09-23 NOTE — Procedures (Signed)
 L3-L4 lumbar puncture performed. Please see full dictation under the imaging tab in Epic.   Warren Dais, AGACNP-BC 09/23/2024, 11:24 AM

## 2024-09-25 ENCOUNTER — Telehealth: Payer: Self-pay | Admitting: Diagnostic Neuroimaging

## 2024-09-25 NOTE — Progress Notes (Signed)
 Combining this result with LP result to notify patient.

## 2024-09-25 NOTE — Telephone Encounter (Signed)
 Patient called in asing about new rxafter getting call from pharmacy.  She was inquiring about acetazolamide rx. I explained LP results and need for medical therapy. Will start med and then follow up with eye doctor and Dr. Chalice.  Donna FABIENE HANLON, MD 09/25/2024, 9:28 PM Certified in Neurology, Neurophysiology and Neuroimaging  [facility] [address] [place], [place] [address] [phone]

## 2024-09-25 NOTE — Progress Notes (Signed)
 Also from Dr Dohmeier:  Chalice, Dedra, MD to Donna Matthews, Donna Matthews  CD    09/24/24 10:01 AM All normal spinal fluid labs. Dohmeier, Dedra, MD to Gna-Pod 3 Results  Donna Matthews, Donna Matthews  (Selected Message) CD   09/23/24  5:21 PM Result Note Normal spinal fluid tests,:  blood cell count, the RBC ( red blood cells ) are a contaminant when the skin is pierced, they were not part of the brain and spinal fluid .   Normal sugar and protein levels were also noted. Again, Normal   Protein and glucose, CSF; CSF cell count with differential; CSF culture w Gram Stain

## 2024-09-26 LAB — CSF CULTURE W GRAM STAIN: Culture: NO GROWTH

## 2024-09-28 ENCOUNTER — Telehealth: Payer: Self-pay | Admitting: Neurology

## 2024-09-28 NOTE — Telephone Encounter (Signed)
 Pt called to request call back from MD the patient the patient states she had a spinal tap last week and was not sure if she should schedule a follow  up or if she need to just call for Results . Pt  would like call to see what she needs to do

## 2024-09-28 NOTE — Telephone Encounter (Signed)
 I called pt and relayed that she had LP, was elevated. Diag IIH/  She is to start on diamox 250mg  po bid to keep fluid normal.  She will see Dr. [facility] for eye exam (check pressures).  Has appt to follow up 02-04-2025 with MM/NP for 5-6 month follow up.  She is doing ok on diamox.  She has been taking/tolerating the medication since the weekend.  Appreciated call back.  Will call back if questions.

## 2024-09-28 NOTE — Telephone Encounter (Signed)
 Called and scheduled pt for her 5 month f/u. Pt is still needing to speak to the nurse regarding what was discussed with her when she called the after hours nurse.

## 2024-09-29 NOTE — Telephone Encounter (Signed)
 Noted. I see our office called pt yesterday about these results.

## 2024-09-30 LAB — OLIGOCLONAL BANDS, CSF + SERM

## 2024-10-01 NOTE — Telephone Encounter (Signed)
-----   Message from Sycamore Dohmeier sent at 09/30/2024  4:48 PM EDT ----- Normal CSF labs, elevated opening pressure, started on diamox last week.   Please ask her if she tolerating Diamox ( acetazolamide) well. It is normal to feel some tingling around the lips and in fingers and feet.  ----- Message ----- From: Interface, [facility] In [place] Sent: 09/23/2024  11:36 AM EDT To: Dedra Gores, MD

## 2024-10-01 NOTE — Telephone Encounter (Signed)
 Spoke to patient gave CSF labwork results Pt states tolerating Diamox well . Pt states just started Medication Saturday 09/26/2024 . Informed patent of possible side effects of medication . Pt thanked me for calling

## 2024-10-29 ENCOUNTER — Telehealth: Payer: Self-pay | Admitting: *Deleted

## 2024-10-29 NOTE — Telephone Encounter (Signed)
Agree with advice and recommendations

## 2024-10-29 NOTE — Telephone Encounter (Signed)
 Spoke with patient, has been on POP for 4 months now, no menses since starting medication until now. Reports spotting and light cramping. Denies N/V, fever/chills. Not sexually active.   Patient missed 1 dose on Sunday and 1 at another time, does not take medication at same time or roughly same time daily. Did not take missed pill.   Advised can experience irregular bleeding with missed or late doses, discussed importance of taking daily and roughly same time. Patient will monitor bleeding, if irregular bleeding continues or new symptoms develop, return call to office. Advised I will review with Tiffany and our office will call if any additional recommendations. Patient agreeable.   Routing to provider for final review. Patient is agreeable to disposition. Will close encounter.

## 2024-11-03 NOTE — Telephone Encounter (Signed)
 Spotting report 11/20 just start of her menses. Normal to have menses with POPs. Will monitor.

## 2024-11-03 NOTE — Telephone Encounter (Signed)
 Patient just wanted you to know that she has started her actual period around Friday morning. She still has 3 weeks of pills still left in her pack. She said she has not missed any pills since she talked to [place] last on Thursday. She has not taken any ibuprofen . She having a moderate flow, with no cramping today. Please advise.

## 2024-11-03 NOTE — Telephone Encounter (Signed)
 Patient notified of information.

## 2024-11-04 ENCOUNTER — Other Ambulatory Visit: Payer: Self-pay

## 2024-11-04 DIAGNOSIS — L669 Cicatricial alopecia, unspecified: Secondary | ICD-10-CM

## 2024-11-04 MED ORDER — SAFETY SEAL MISCELLANEOUS MISC: 2 refills | Status: DC

## 2024-12-22 ENCOUNTER — Ambulatory Visit: Admitting: Dermatology

## 2024-12-22 ENCOUNTER — Encounter: Payer: Self-pay | Admitting: Dermatology

## 2024-12-22 VITALS — BP 121/74 | HR 67

## 2024-12-22 DIAGNOSIS — L209 Atopic dermatitis, unspecified: Secondary | ICD-10-CM

## 2024-12-22 DIAGNOSIS — Z853 Personal history of malignant neoplasm of breast: Secondary | ICD-10-CM | POA: Insufficient documentation

## 2024-12-22 DIAGNOSIS — L309 Dermatitis, unspecified: Secondary | ICD-10-CM

## 2024-12-22 DIAGNOSIS — L669 Cicatricial alopecia, unspecified: Secondary | ICD-10-CM | POA: Diagnosis not present

## 2024-12-22 MED ORDER — SAFETY SEAL MISCELLANEOUS MISC: 11 refills | Status: AC

## 2024-12-22 NOTE — Patient Instructions (Addendum)
 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 You have been using a compounded topical solution for your hair loss and triamcinolone  cream for your eczema flares. We reviewed your current regimen and made plans to continue your treatments.  YOUR PLAN:  -CENTRAL CENTRIFUGAL CICATRICIAL ALOPECIA (CCCA):  CCCA is a condition that causes hair loss and thinning, primarily due to inflammation and genetic factors. You will continue using minoxidil, clobetasol, and finasteride to manage this condition.   Clobetasol will be maintained for a year to control inflammation and prevent further hair loss. You have been prescribed clobetasol with six refills for the next six months. Please continue taking your collagen and VisiCap supplements to support hair growth. We will schedule a follow-up appointment in six months to monitor your progress.  -ECZEMA:  Eczema is a skin condition that causes itchy and inflamed patches on the skin. You will continue using triamcinolone  cream as needed during flare-ups, which occur every two to three months. You should also continue moisturizing your skin with Eucerin Advanced Repair or Jergens lotion. If you need a refill of triamcinolone  before October, please contact our office.  INSTRUCTIONS:  Please schedule a follow-up appointment in six months to monitor your CCCA progress. If you need a refill of triamcinolone  before October, contact our office.    Important Information   Due to recent changes in healthcare laws, you may see results of your pathology and/or laboratory studies on MyChart before the doctors have had a chance to review them. We understand that in some cases there may be results that are confusing or concerning to you. Please understand that not all results are received at the same time and often the doctors may need to interpret multiple results in order to  provide you with the best plan of care or course of treatment. Therefore, we ask that you please give us  2 business days to thoroughly review all your results before contacting the office for clarification. Should we see a critical lab result, you will be contacted sooner.     If You Need Anything After Your Visit   If you have any questions or concerns for your doctor, please call our main line at [phone]. If no one answers, please leave a voicemail as directed and we will return your call as soon as possible. Messages left after 4 pm will be answered the following business day.    You may also send us  a message via MyChart. We typically respond to MyChart messages within 1-2 business days.  For prescription refills, please ask your pharmacy to contact our office. Our fax number is [phone].  If you have an urgent issue when the clinic is closed that cannot wait until the next business day, you can page your doctor at the number below.     Please note that while we do our best to be available for urgent issues outside of office hours, we are not available 24/7.    If you have an urgent issue and are unable to reach us , you may choose to seek medical care at your doctor's office, retail clinic, urgent care center, or emergency room.   If you have a medical emergency, please immediately call 911 or go to the emergency department. In the event of inclement weather, please call our main line at [phone] for an update on the status of any delays or closures.  Dermatology Medication Tips: Please  keep the boxes that topical medications come in in order to help keep track of the instructions about where and how to use these. Pharmacies typically print the medication instructions only on the boxes and not directly on the medication tubes.   If your medication is too expensive, please contact our office at [phone] or send us  a message through MyChart.    We are unable to tell what  your co-pay for medications will be in advance as this is different depending on your insurance coverage. However, we may be able to find a substitute medication at lower cost or fill out paperwork to get insurance to cover a needed medication.    If a prior authorization is required to get your medication covered by your insurance company, please allow us  1-2 business days to complete this process.   Drug prices often vary depending on where the prescription is filled and some pharmacies may offer cheaper prices.   The website www.goodrx.com contains coupons for medications through different pharmacies. The prices here do not account for what the cost may be with help from insurance (it may be cheaper with your insurance), but the website can give you the price if you did not use any insurance.  - You can print the associated coupon and take it with your prescription to the pharmacy.  - You may also stop by our office during regular business hours and pick up a GoodRx coupon card.  - If you need your prescription sent electronically to a different pharmacy, notify our office through [facility] or by phone at [phone]    "

## 2024-12-22 NOTE — Progress Notes (Signed)
" ° °  Follow-Up Visit   Subjective  Donna Matthews is a 49 y.o. female who presents for the following: CCCA and Eczema  Patient present today for follow up visit for CCCA & Eczema. Patient was last evaluated on 07/2024.   CCCA: At this visit patient had ILK Injections Completed & Finasteride was added to Compound topical (Minoxidil USP 10% and Clobetasol USP 0.05% Finasteride 0.1% ).She is currently taking Vivascal and Vital Protein. Patient reports sxs are Improving but not at goal. Patient denies medication changes.  Eczema: At this visit patient was prescribed TMC 0.1%. Patient reports sxs are better. She has only had to use it once since her previous visit.   Patient provided verbal consent for the use of an AI-assisted program to generate a detailed after-visit summary. The patient understands that the AI tool is used to support clinical documentation and that all information will be reviewed and verified by the healthcare provider.  Patient reports she is not actively pregnant, trying to conceive or nursing.  The following portions of the chart were reviewed this encounter and updated as appropriate: medications, allergies, medical history  Review of Systems:  No other skin or systemic complaints except as noted in HPI or Assessment and Plan.  Objective  Well appearing patient in no apparent distress; mood and affect are within normal limits.   A focused examination was performed of the following areas: Scalp  Relevant exam findings are noted in the Assessment and Plan.                  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 Inflammation is a concern due to potential  permanent hair loss if not managed. Current treatment with minoxidil, clobetasol, and finasteride is effective. Clobetasol is maintained for a year to ensure inflammation is controlled and prevent hair loss recurrence. Hair care practices can accelerate CCCA, but it is primarily genetic.  - Continue minoxidil, clobetasol, and finasteride. - Scheduled follow-up in six months. - Continue collagen and Viviscal supplements to support hair growth - Plan to follow up in October  ATOPIC DERMATITIS Exam: Scaly pink papules coalescing to plaques  Well controlled  Atopic dermatitis (eczema) is a chronic, relapsing, pruritic condition that can significantly affect quality of life. It is often associated with allergic rhinitis and/or asthma and can require treatment with topical medications, phototherapy, or in severe cases biologic injectable medication (Dupixent; Adbry) or Oral JAK inhibitors.  Flares on chest and arms, managed with triamcinolone . Flares occur every two to three months but are not chronic. Current treatment is effective, and no need for injectable medication as flares do not recur quickly after stopping triamcinolone .  Treatment Plan: - Recommend gentle skin care - Continue TMC % PRN for flares SCARRING ALOPECIA   This Visit - Safety Seal Miscellaneous MISC - Medication name: Gel Minoxidil USP 10% and Clobetasol USP 0.05% Finasteride 0.1% - Apply to scalp every morning.  Return in about 9 months (around 09/21/2025) for CCCA F/U.  I, Jetta Ager, am acting as neurosurgeon for [employer], DO.  Documentation: I have reviewed the above documentation for accuracy and completeness, and I agree with the above.  Delon Lenis, DO   "

## 2024-12-29 ENCOUNTER — Telehealth: Payer: Self-pay | Admitting: *Deleted

## 2024-12-29 NOTE — Telephone Encounter (Signed)
 Pt medical records faxed on 12/29/2024 to [phone]

## 2024-12-29 NOTE — Telephone Encounter (Signed)
 I called pt left message not able to reach pt.

## 2025-01-14 ENCOUNTER — Telehealth: Payer: Self-pay | Admitting: Adult Health

## 2025-01-14 ENCOUNTER — Other Ambulatory Visit: Payer: Self-pay | Admitting: Hematology and Oncology

## 2025-01-14 DIAGNOSIS — Z1231 Encounter for screening mammogram for malignant neoplasm of breast: Secondary | ICD-10-CM

## 2025-01-14 NOTE — Telephone Encounter (Signed)
Patient called to verify appointment date.

## 2025-02-04 ENCOUNTER — Ambulatory Visit: Admitting: Adult Health

## 2025-02-26 ENCOUNTER — Ambulatory Visit: Admitting: Nurse Practitioner

## 2025-08-20 ENCOUNTER — Ambulatory Visit

## 2025-09-30 ENCOUNTER — Ambulatory Visit: Admitting: Dermatology
